# Patient Record
Sex: Male | Born: 1940 | Race: White | Hispanic: No | Marital: Married | Smoking: Former smoker
Health system: Southern US, Community
[De-identification: ages and names within clinical notes are randomized; demographics above are authoritative.]

## PROBLEM LIST (undated history)

## (undated) DIAGNOSIS — E785 Hyperlipidemia, unspecified: Secondary | ICD-10-CM

## (undated) DIAGNOSIS — I219 Acute myocardial infarction, unspecified: Secondary | ICD-10-CM

## (undated) DIAGNOSIS — M1990 Unspecified osteoarthritis, unspecified site: Secondary | ICD-10-CM

## (undated) DIAGNOSIS — I8390 Asymptomatic varicose veins of unspecified lower extremity: Secondary | ICD-10-CM

## (undated) DIAGNOSIS — C801 Malignant (primary) neoplasm, unspecified: Secondary | ICD-10-CM

## (undated) DIAGNOSIS — I2699 Other pulmonary embolism without acute cor pulmonale: Secondary | ICD-10-CM

## (undated) DIAGNOSIS — E039 Hypothyroidism, unspecified: Secondary | ICD-10-CM

## (undated) DIAGNOSIS — I739 Peripheral vascular disease, unspecified: Secondary | ICD-10-CM

## (undated) DIAGNOSIS — D759 Disease of blood and blood-forming organs, unspecified: Secondary | ICD-10-CM

## (undated) DIAGNOSIS — I2510 Atherosclerotic heart disease of native coronary artery without angina pectoris: Secondary | ICD-10-CM

## (undated) DIAGNOSIS — D751 Secondary polycythemia: Secondary | ICD-10-CM

## (undated) DIAGNOSIS — I10 Essential (primary) hypertension: Secondary | ICD-10-CM

## (undated) HISTORY — DX: Atherosclerotic heart disease of native coronary artery without angina pectoris: I25.10

## (undated) HISTORY — DX: Secondary polycythemia: D75.1

## (undated) HISTORY — DX: Asymptomatic varicose veins of unspecified lower extremity: I83.90

## (undated) HISTORY — DX: Hyperlipidemia, unspecified: E78.5

## (undated) HISTORY — DX: Essential (primary) hypertension: I10

## (undated) HISTORY — PX: VASCULAR SURGERY: SHX849

---

## 2003-03-26 ENCOUNTER — Ambulatory Visit (HOSPITAL_COMMUNITY): Admission: RE | Admit: 2003-03-26 | Discharge: 2003-03-26 | Payer: Self-pay | Admitting: Gastroenterology

## 2009-10-09 HISTORY — PX: PROSTATE SURGERY: SHX751

## 2009-12-27 ENCOUNTER — Ambulatory Visit: Payer: Self-pay | Admitting: Vascular Surgery

## 2010-03-29 ENCOUNTER — Ambulatory Visit: Payer: Self-pay | Admitting: Vascular Surgery

## 2010-05-30 ENCOUNTER — Ambulatory Visit: Payer: Self-pay | Admitting: Vascular Surgery

## 2010-06-02 ENCOUNTER — Ambulatory Visit: Payer: Self-pay | Admitting: Vascular Surgery

## 2010-06-06 ENCOUNTER — Ambulatory Visit: Payer: Self-pay | Admitting: Vascular Surgery

## 2010-06-20 ENCOUNTER — Inpatient Hospital Stay (HOSPITAL_COMMUNITY)
Admission: RE | Admit: 2010-06-20 | Discharge: 2010-06-21 | Payer: Self-pay | Source: Home / Self Care | Admitting: Urology

## 2010-06-20 ENCOUNTER — Encounter (INDEPENDENT_AMBULATORY_CARE_PROVIDER_SITE_OTHER): Payer: Self-pay | Admitting: Urology

## 2010-06-29 ENCOUNTER — Ambulatory Visit: Payer: Self-pay | Admitting: Internal Medicine

## 2010-06-29 ENCOUNTER — Encounter: Admission: RE | Admit: 2010-06-29 | Discharge: 2010-06-29 | Payer: Self-pay | Admitting: Internal Medicine

## 2010-06-29 ENCOUNTER — Inpatient Hospital Stay (HOSPITAL_COMMUNITY): Admission: EM | Admit: 2010-06-29 | Discharge: 2010-07-04 | Payer: Self-pay | Admitting: Emergency Medicine

## 2010-06-30 ENCOUNTER — Encounter (INDEPENDENT_AMBULATORY_CARE_PROVIDER_SITE_OTHER): Payer: Self-pay | Admitting: Internal Medicine

## 2010-06-30 ENCOUNTER — Ambulatory Visit: Payer: Self-pay | Admitting: Vascular Surgery

## 2010-10-30 ENCOUNTER — Encounter: Payer: Self-pay | Admitting: Internal Medicine

## 2010-12-22 LAB — BASIC METABOLIC PANEL
CO2: 25 mEq/L (ref 19–32)
CO2: 27 mEq/L (ref 19–32)
Calcium: 8.9 mg/dL (ref 8.4–10.5)
Calcium: 9 mg/dL (ref 8.4–10.5)
Chloride: 101 mEq/L (ref 96–112)
Chloride: 105 mEq/L (ref 96–112)
Creatinine, Ser: 1.12 mg/dL (ref 0.4–1.5)
Creatinine, Ser: 1.28 mg/dL (ref 0.4–1.5)
GFR calc Af Amer: >60 mL/min (ref >60)
GFR calc Af Amer: >60 mL/min (ref >60)
GFR calc non Af Amer: >60 mL/min (ref >60)
Glucose, Bld: 102 mg/dL — ABNORMAL HIGH (ref 70–99)
Potassium: 4.4 mEq/L (ref 3.5–5.1)
Potassium: 4.1 mEq/L (ref 3.5–5.1)
Sodium: 137 mEq/L (ref 135–145)
Sodium: 138 mEq/L (ref 135–145)

## 2010-12-22 LAB — HEPATIC FUNCTION PANEL
AST: 57 U/L — ABNORMAL HIGH (ref 0–37)
Albumin: 3 g/dL — ABNORMAL LOW (ref 3.5–5.2)

## 2010-12-22 LAB — URINE CULTURE
Colony Count: NO GROWTH
Culture  Setup Time: 201109221124
Culture: NO GROWTH

## 2010-12-22 LAB — CBC
HCT: 43.5 % (ref 39.0–52.0)
HCT: 43.5 % (ref 39.0–52.0)
Hemoglobin: 14.6 g/dL (ref 13.0–17.0)
Hemoglobin: 14.9 g/dL (ref 13.0–17.0)
Hemoglobin: 14.9 g/dL (ref 13.0–17.0)
Hemoglobin: 17.8 g/dL — ABNORMAL HIGH (ref 13.0–17.0)
MCH: 28.8 pg (ref 26.0–34.0)
MCH: 29 pg (ref 26.0–34.0)
MCH: 29 pg (ref 26.0–34.0)
MCH: 30.2 pg (ref 26.0–34.0)
MCHC: 33.9 g/dL (ref 30.0–36.0)
MCHC: 34.3 g/dL (ref 30.0–36.0)
MCV: 84.6 fL (ref 78.0–100.0)
MCV: 86.3 fL (ref 78.0–100.0)
MCV: 86.7 fL (ref 78.0–100.0)
MCV: 86.7 fL (ref 78.0–100.0)
MCV: 87.7 fL (ref 78.0–100.0)
Platelets: 295 10*3/uL (ref 150–400)
Platelets: 299 10*3/uL (ref 150–400)
Platelets: 299 10*3/uL (ref 150–400)
Platelets: 301 10*3/uL (ref 150–400)
Platelets: 305 10*3/uL (ref 150–400)
Platelets: 276 10*3/uL (ref 150–400)
RBC: 4.76 MIL/uL (ref 4.22–5.81)
RBC: 4.88 MIL/uL (ref 4.22–5.81)
RBC: 4.93 MIL/uL (ref 4.22–5.81)
RBC: 5.02 MIL/uL (ref 4.22–5.81)
RBC: 5.14 MIL/uL (ref 4.22–5.81)
RBC: 5.88 MIL/uL — ABNORMAL HIGH (ref 4.22–5.81)
RDW: 14.2 % (ref 11.5–15.5)
RDW: 14.3 % (ref 11.5–15.5)
RDW: 14.3 % (ref 11.5–15.5)
WBC: 20.2 10*3/uL — ABNORMAL HIGH (ref 4.0–10.5)
WBC: 20.3 10*3/uL — ABNORMAL HIGH (ref 4.0–10.5)
WBC: 21.5 10*3/uL — ABNORMAL HIGH (ref 4.0–10.5)
WBC: 22 10*3/uL — ABNORMAL HIGH (ref 4.0–10.5)
WBC: 14.4 10*3/uL — ABNORMAL HIGH (ref 4.0–10.5)

## 2010-12-22 LAB — CARDIAC PANEL(CRET KIN+CKTOT+MB+TROPI)
CK, MB: 2.1 ng/mL (ref 0.3–4.0)
Relative Index: INVALID (ref 0.0–2.5)
Relative Index: INVALID (ref 0.0–2.5)
Total CK: 33 U/L (ref 7–232)
Troponin I: 0.05 ng/mL (ref 0.00–0.06)
Troponin I: 0.06 ng/mL (ref 0.00–0.06)

## 2010-12-22 LAB — TROPONIN I: Troponin I: 0.03 ng/mL (ref 0.00–0.06)

## 2010-12-22 LAB — HEMOCCULT GUIAC POC 1CARD (OFFICE): Fecal Occult Bld: NEGATIVE

## 2010-12-22 LAB — HEMOGLOBIN: Hemoglobin: 14.5 g/dL (ref 13.0–17.0)

## 2010-12-22 LAB — DIFFERENTIAL
Basophils Absolute: 0.1 10*3/uL (ref 0.0–0.1)
Eosinophils Absolute: 0.5 10*3/uL (ref 0.0–0.7)
Lymphocytes Relative: 10 % — ABNORMAL LOW (ref 12–46)
Lymphocytes Relative: 11 % — ABNORMAL LOW (ref 12–46)
Lymphs Abs: 1.9 10*3/uL (ref 0.7–4.0)
Lymphs Abs: 2.3 10*3/uL (ref 0.7–4.0)
Monocytes Absolute: 4.1 10*3/uL — ABNORMAL HIGH (ref 0.1–1.0)
Neutro Abs: 13.5 10*3/uL — ABNORMAL HIGH (ref 1.7–7.7)
Neutrophils Relative %: 65 % (ref 43–77)

## 2010-12-22 LAB — TYPE AND SCREEN: Antibody Screen: NEGATIVE

## 2010-12-22 LAB — URINALYSIS, ROUTINE W REFLEX MICROSCOPIC
Bilirubin Urine: NEGATIVE
Protein, ur: NEGATIVE mg/dL
Urobilinogen, UA: 1 mg/dL (ref 0.0–1.0)

## 2010-12-22 LAB — CK TOTAL AND CKMB (NOT AT ARMC)
CK, MB: 1.5 ng/mL (ref 0.3–4.0)
Total CK: 38 U/L (ref 7–232)

## 2010-12-22 LAB — HEMOGLOBIN AND HEMATOCRIT, BLOOD: Hemoglobin: 15.8 g/dL (ref 13.0–17.0)

## 2010-12-22 LAB — HEPARIN LEVEL (UNFRACTIONATED): Heparin Unfractionated: 0.24 IU/mL — ABNORMAL LOW (ref 0.30–0.70)

## 2010-12-22 LAB — BRAIN NATRIURETIC PEPTIDE: Pro B Natriuretic peptide (BNP): 125 pg/mL — ABNORMAL HIGH (ref 0.0–100.0)

## 2010-12-22 LAB — PROTIME-INR
INR: 1.34 (ref 0.00–1.49)
Prothrombin Time: 15.3 seconds — ABNORMAL HIGH (ref 11.6–15.2)
Prothrombin Time: 15.4 seconds — ABNORMAL HIGH (ref 11.6–15.2)
Prothrombin Time: 15.6 seconds — ABNORMAL HIGH (ref 11.6–15.2)
Prothrombin Time: 16.1 seconds — ABNORMAL HIGH (ref 11.6–15.2)

## 2010-12-22 LAB — POCT CARDIAC MARKERS: Myoglobin, poc: 75.2 ng/mL (ref 12–200)

## 2010-12-22 LAB — HEMATOCRIT: HCT: 42.5 % (ref 39.0–52.0)

## 2010-12-22 LAB — URINE MICROSCOPIC-ADD ON

## 2010-12-22 LAB — SURGICAL PCR SCREEN: MRSA, PCR: NEGATIVE

## 2010-12-22 LAB — APTT: aPTT: 38 seconds — ABNORMAL HIGH (ref 24–37)

## 2010-12-22 LAB — MAGNESIUM: Magnesium: 2.1 mg/dL (ref 1.5–2.5)

## 2010-12-22 LAB — LIPID PANEL: VLDL: 17 mg/dL (ref 0–40)

## 2010-12-22 LAB — PHOSPHORUS: Phosphorus: 2.6 mg/dL (ref 2.3–4.6)

## 2011-02-21 NOTE — Assessment & Plan Note (Signed)
OFFICE VISIT   DVONTAE, RUAN  DOB:  29-Sep-1941                                       03/29/2010  ZOXWR#:[id number]   The patient returns today for continued followup regarding severe venous  insufficiency of both lower extremities.  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  He has been wearing long-leg  elastic compression stockings (20 mm-30 mm of gradient) and has had no  improvement.  He has documented reflux throughout the left great  saphenous vein from the saphenofemoral junction to the knee.  The right  great saphenous vein is absent but it does have incompetent perforators  causing high pressure supplying these recurrent varicosities.  Despite  conservative measures, he continues to have significant symptoms in both  legs.  He also has reflux in the left small saphenous vein supplying  bulging varicosities in the left posterior calf.   On examination, he has 3+ dorsalis pedis pulses bilaterally.  Has  bulging varicosities in the distal thigh and calf areas in both legs  with 1+ edema.   I think because he has failed conservative treatment he needs the  following procedures:  1. Laser ablation of the left great saphenous vein.  2. Laser ablation of the left small saphenous vein.  3. Stab phlebectomies of the right leg.  4. Stab phlebectomies of the left leg following his 2 laser      procedures.  We will proceed with precertification for this in the      near future.     Quita Skye Hart [place], M.D.  Electronically Signed   JDL/MEDQ  D:  03/29/2010  T:  03/29/2010  Job:  [id number]

## 2011-02-21 NOTE — Assessment & Plan Note (Signed)
OFFICE VISIT   [name], Gregory Blankenship  DOB:  1941-05-30                                       06/06/2010  [id number]#:[id number]   The patient had multiple stab phlebectomies (greater than 20) of the  right leg for painful varicosities.  There is no long segment of  saphenous vein to require closure with the laser at this time.  He  tolerated the procedure well and following robotic prostate surgery in  the near future, he will return for his laser ablation of his left small  saphenous vein.  I did visualize the left saphenofemoral junction today  and there is a slight bulge into the common femoral vein at the  saphenofemoral junction from the recent closure 1 week ago of the left  great saphenous vein.  He has no distal edema and the left common  femoral vein has excellent flow and is essentially widely patent except  for this slight protrusion.  He will continue taking daily aspirin and  return to see as soon following his prostate surgery.     Quita Skye Hart [place], M.D.  Electronically Signed   JDL/MEDQ  D:  06/06/2010  T:  06/07/2010  Job:  [id number]

## 2011-02-21 NOTE — Procedures (Signed)
DUPLEX DEEP VENOUS EXAM - LOWER EXTREMITY   INDICATION:  Followup left greater saphenous vein laser ablation.   HISTORY:  Edema:  No.  Trauma/Surgery:  Left greater saphenous vein laser ablation on  05/30/2010.  Pain:  No.  PE:  No.  Previous DVT:  No.  Anticoagulants:  Other:   DUPLEX EXAM:                CFV   SFV   PopV  PTV    GSV                R  L  R  L  R  L  R   L  R  L  Thrombosis    o  +     o     o      o     +  Spontaneous   +  +     +     +      +     o  Phasic        +  +     +     +      +     o  Augmentation  +  +     +     +      +     o  Compressible  +  P     +     +      +     o  Competent     o  o     o     o      +     o   Legend:  + - yes  o - no  p - partial  D - decreased   IMPRESSION:  1. Totally occluded left greater saphenous vein extending from the      distal insertion site to the saphenofemoral junction with thrombus      protruding 0.4 cm into the common femoral vein lumen.  2. Clinically significant reflux noted in the bilateral common femoral      veins, left superficial femoral vein, popliteal vein, short      saphenous vein and in the left saphenofemoral junction, as      described above.  3. Two incompetent perforators noted in the left distal medial calf      area measuring > 0.4 cm.    _____________________________  Quita Skye. Hart [place], M.D.   CH/MEDQ  D:  06/03/2010  T:  06/03/2010  Job:  [id number]

## 2011-02-21 NOTE — Procedures (Signed)
LOWER EXTREMITY VENOUS REFLUX EXAM   INDICATION:  Bilateral lower extremity swelling, pain, and varicosities.   EXAM:  Using color-flow imaging and pulse Doppler spectral analysis, the  bilateral common femoral, superficial femoral, popliteal, posterior  tibial, greater and lesser saphenous veins are evaluated.  There is  evidence suggesting mild deep venous insufficiency in bilateral lower  extremities.   The bilateral saphenofemoral junctions are competent. The left GSV is  not competent with Reflux of >556milliseconds with the caliber as  described below.   The left proximal short saphenous vein demonstrates incompetency.   GSV Diameter (used if found to be incompetent only)                                            Right    Left  Proximal Greater Saphenous Vein           cm       0.88 cm  Proximal-to-mid-thigh                     cm       0.92 cm  Mid thigh                                 cm       0.86 cm  Mid-distal thigh                          cm       cm  Distal thigh                              cm       1.01 cm  Knee                                      cm       0.99 cm   IMPRESSION:  1. Left greater saphenous vein Reflux with >514milliseconds is      identified with the caliber ranging from 0.99 cm to 0.88 cm knee to      groin.  2. The left greater saphenous vein is not aneurysmal.  3. The left greater saphenous vein is not tortuous.  4. The deep venous system is competent.  5. The left lesser saphenous vein is not competent with Reflux of      >571milliseconds and measures 0.47 cm to proximal calf and 0.52 in      the mid calf.  6. The right great saphenous vein in the mid thigh and anterior      varicose vein branch are  thrombosed and recannulized in the distal      thigh varicosity to create a false great saphenous vein from distal      thigh to the proximal calf.  7. Incompetent perforator veins identified bilaterally and      contributing  to varicosities, right medial calf proximal at 0.44      cm, mid 0.42 cm, and distal 0.44 cm.  Left medial calf proximal      0.32 cm, mid 0.3 and 0.39 cm.  Distal 0.54 cm.  Left posterior calf  measuring 0.31 cm.   ___________________________________________  Quita Skye. Hart [place], M.D.   CJ/MEDQ  D:  12/27/2009  T:  12/27/2009  Job:  [id number]

## 2011-02-21 NOTE — Consult Note (Signed)
NEW PATIENT CONSULTATION   Gregory Blankenship, Gregory Blankenship  DOB:  Apr 26, 1941                                       12/27/2009  OZHYQ#:[id number]   Patient is a 70 year old male patient with severe venous insufficiency  of the left leg, worse than the right.  He previously has had venous  procedures in both lower extremities.  He had ligation stripping of his  greater saphenous vein in the right leg in the early 1960s.  His left  leg had a surgical procedure performed by me in the early 1990s with a  stripping of his distal left great saphenous vein from the ankle to the  knee.  Several years later, he developed recurrent varicosities which  had become increasingly painful with aching, cramping, and a tired heavy  feeling as the day progresses, associated with swelling in the ankle and  foot.  He has had no history of bleeding ulceration, thrombophlebitis,  deep venous thrombosis but has had increasing bulging as time goes on.  The right leg is less symptomatic.  He has worn some short leg  compression stockings recently but no long-leg stockings.  He does have  some improvement with elevation of the legs but does not take pain  medicine on a regular basis.   Negative for diabetes, hypertension, coronary artery disease, COPD, or  stroke.   FAMILY HISTORY:  Positive for varicose veins in his mother and  grandfather.  Negative for coronary artery disease, diabetes, and  stroke.   SOCIAL HISTORY:  He is married, has 2 children, is retired.  Does not  smoke since 1969.  Does not use alcohol.   REVIEW OF SYSTEMS:  Denies any chest pain, dyspnea on exertion, PND,  orthopnea.  No claudication.  No GI, GU or neurologic symptoms.  No  musculoskeletal symptoms.  All other systems and review of systems are  negative.   PHYSICAL EXAMINATION:  Blood pressure 134/98, heart rate 60,  respirations 16.  Generally, he is a well-developed, well-nourished male  who is in no apparent distress,  alert and oriented x3.  Neck is supple,  3+ carotid pulses palpable.  No bruits are audible.  Left leg has  bulging varicosities beginning in the distal thigh medially over the  great saphenous system, extending down on the medial knee into the  medial calf with a few posteriorly extending down from the popliteal  fossa.  There is 1+ edema distally with no hyperpigmentation or  ulceration noted.  The right leg has a few isolated varicosities in the  medial calf and anterior medial thigh.   Today I ordered a venous duplex exam which I reviewed and interpreted.  He has some mild reflux in the deep systems bilaterally.  In the left  leg, he has gross reflux from the saphenofemoral junction to the knee in  the great saphenous vein, which has not been removed, and an absent  great saphenous vein distally with a few incompetent perforators in the  calf.  The left small saphenous also has reflux but is a smaller size.  The right leg has an absent great saphenous vein with a few incompetent  perforators in the distal thigh and mid calf.   This patient is having painful varicosities secondary to left great  saphenous reflux, which are affecting his daily living.  We will treat  him  conservatively with long-leg elastic compression stockings (20 mm -  30 mm gradient) as well as elevation and ibuprofen on a daily basis.  If  he has had no improvement in 3 months, I think he should be offered  laser ablation of the left great saphenous vein with multiple stab  phlebectomies as essential procedure.  He will return in 3 months for  further followup.     Quita Skye Hart [place], M.D.  Electronically Signed   JDL/MEDQ  D:  12/27/2009  T:  12/28/2009  Job:  [id number]   cc:   Deirdre Peer. Polite, M.D.

## 2011-02-24 NOTE — Op Note (Signed)
   NAME:  Gregory Blankenship, Gregory Blankenship                         ACCOUNT NO.:  [id number]   MEDICAL RECORD NO.:  [id number]                  PATIENT TYPE:   LOCATION:                                       FACILITY:  [facility]   PHYSICIAN:  Danise Edge, M.D.                DATE OF BIRTH:  [birth year]   DATE OF PROCEDURE:  03/26/2003  DATE OF DISCHARGE:  03/26/2003                                 OPERATIVE REPORT   PROCEDURE:  Colonoscopy.   INDICATIONS:  This patient is a 70 year old male born 1941-10-08.  The is  patient is undergoing a diagnostic colonoscopy to evaluate guaiac-positive  brown stool by digital rectal examination performed by Dr. Ike Bene.   ENDOSCOPIST:  Danise Edge, M.D.   PREMEDICATIONS:  Versed 7.5 mg, Demerol 50 mg.   PROCEDURE:  After obtaining informed consent this patient was placed in the  left lateral decubitus position.  I administered intravenous Demerol and  intravenous Versed to achieve conscious sedation for the procedure.  The  patient's blood pressure, oxygen saturation, and cardiac rhythm were  monitored throughout the procedure and documented in the medical record.   Anal inspection was normal.  Digital rectal exam revealed a nonnodular  prostate.  The Olympus adult colonoscope was introduced into the rectum and  advanced up the cecum.  A normal appearing ileocecal valve was intubated and  the distal ileum inspection.  Colonic preparation for the exam today was  excellent.   RECTUM:  Normal.   SIGMOID COLON AND DESCENDING COLON:  Normal.   SPLENIC FLEXURE:  Normal.   TRANSVERSE COLON:  Normal.   HEPATIC FLEXURE:  Normal.   ASCENDING COLON:  Normal.   CECUM AND ILEOCECAL VALVE:  Normal.   DISTAL ILEUM:  Normal.    ASSESSMENT:  Normal proctocolonoscopy to the cecum.  No endoscopic evidence  for the presence of colorectal neoplasia or lower gastrointestinal bleeding.                                               Danise Edge,  M.D.    MJ/MEDQ  D:  03/26/2003  T:  03/27/2003  Job:  [id number]   cc:   Ike Bene, M.D.  [address]  [place]  [place] [address]  Fax: [phone]

## 2011-07-03 IMAGING — CR DG CHEST 1V PORT
1 series · 1 of 1 positions shown · non-contrast
Comparison: 06/10/2010 and chest CT dated 06/29/2010

CLINICAL DATA: Follow-up pulmonary embolism

PORTABLE CHEST - 1 VIEW

[AP]
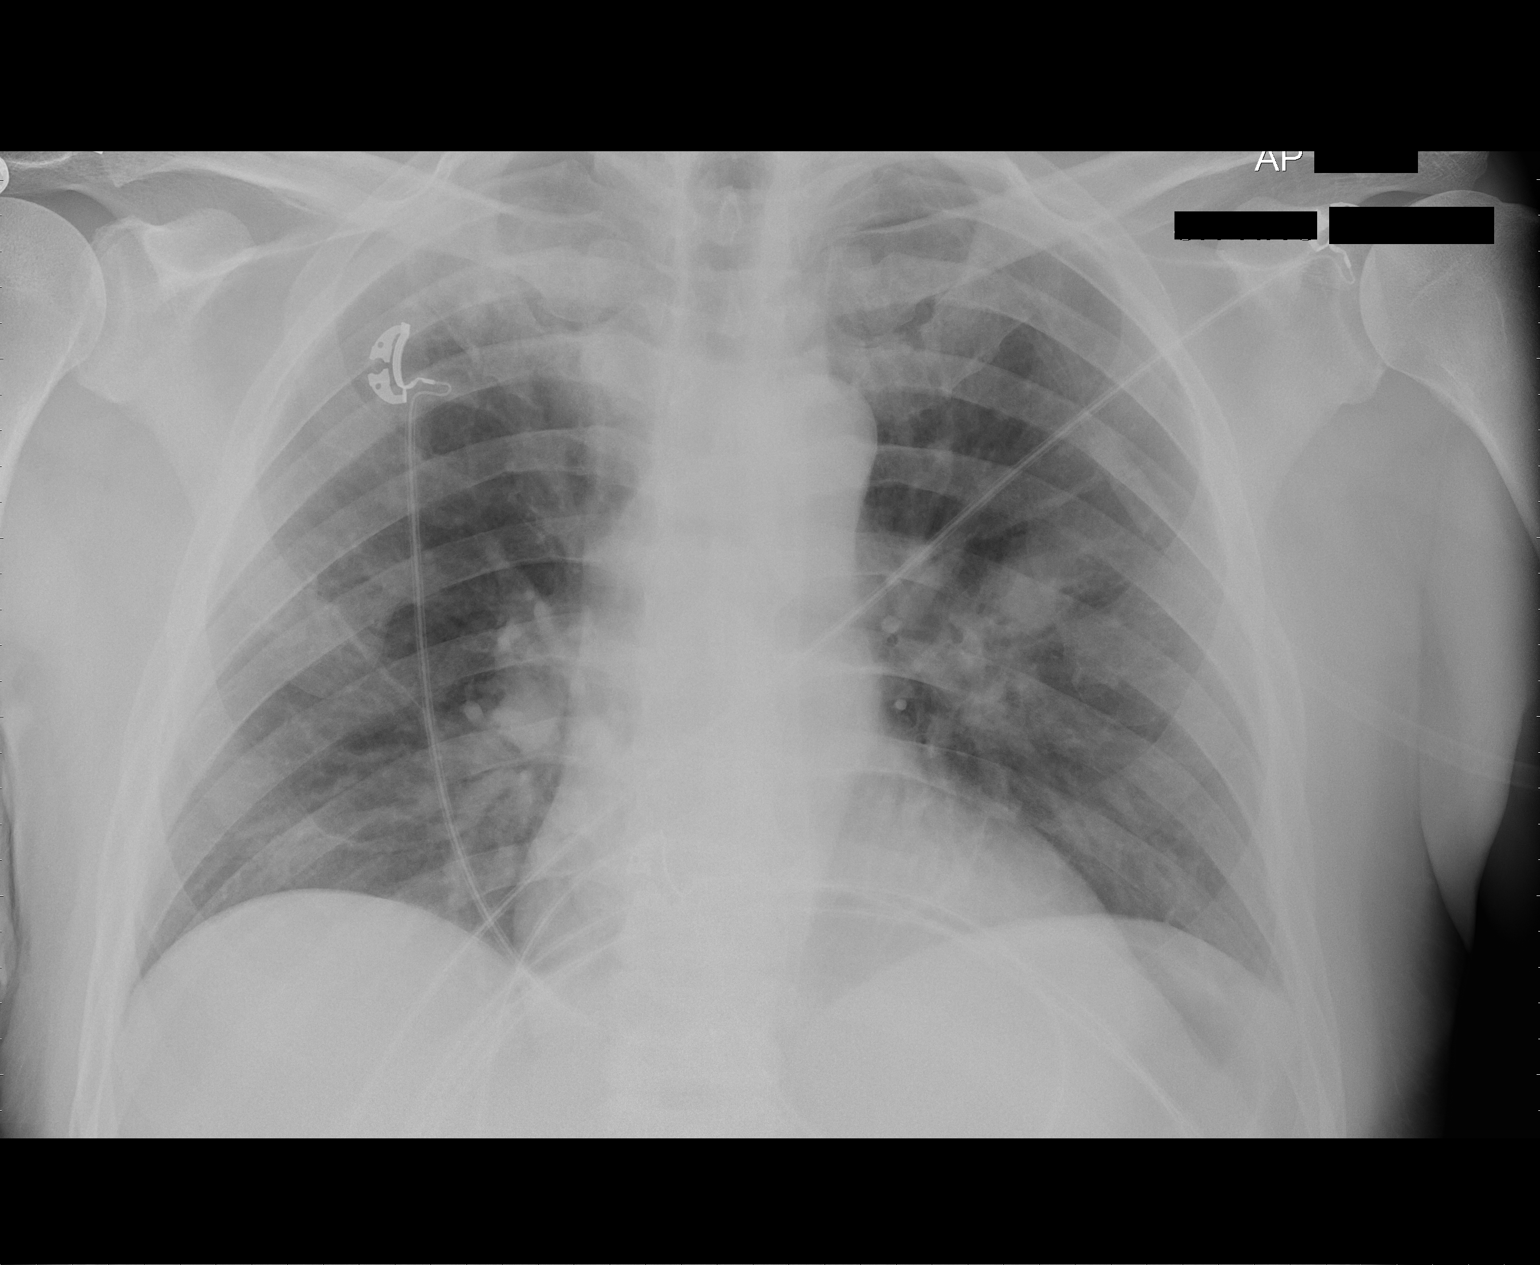

[1 of 1 positions shown; findings below may reference images not displayed]

FINDINGS: There is increasing perihilar opacities with persistent
increased opacity in the right upper lobe.  There may be small
pleural effusions.  The heart is unchanged in size and contour.
The upper abdomen osseous structures are unchanged.
IMPRESSION: Interval increase in perihilar airspace opacities with unchanged
opacity the right upper lobe.  This may relate to evolution of
pulmonary infarction/reperfusion edema especially in the right
upper lobe.  Superimposed infection or developing edema cannot be
excluded.

## 2011-11-27 ENCOUNTER — Telehealth: Payer: Self-pay | Admitting: Oncology

## 2011-11-27 ENCOUNTER — Telehealth: Payer: Self-pay | Admitting: *Deleted

## 2011-11-27 NOTE — Telephone Encounter (Signed)
Rec'd referral from Dr Nehemiah Settle for elevated WBC's and elevated RBC's, r/o PCV

## 2011-11-27 NOTE — Telephone Encounter (Signed)
called pt and scheduled new pt appt for 02/26.  will fax over a letter to Dr. Idelle Crouch office with appt d/t

## 2011-11-28 ENCOUNTER — Telehealth: Payer: Self-pay | Admitting: Oncology

## 2011-11-28 NOTE — Telephone Encounter (Signed)
Referred by Dr. Renford Dills Dx- Elevated WBC/RBC

## 2011-12-01 ENCOUNTER — Other Ambulatory Visit: Payer: Self-pay | Admitting: Oncology

## 2011-12-01 DIAGNOSIS — D751 Secondary polycythemia: Secondary | ICD-10-CM

## 2011-12-02 ENCOUNTER — Encounter (HOSPITAL_COMMUNITY)
Admission: EM | Disposition: A | Discharge status: Home / Self Care | Payer: Self-pay | Source: Home / Self Care | Attending: Cardiovascular Disease

## 2011-12-02 ENCOUNTER — Other Ambulatory Visit: Payer: Self-pay

## 2011-12-02 ENCOUNTER — Inpatient Hospital Stay (HOSPITAL_BASED_OUTPATIENT_CLINIC_OR_DEPARTMENT_OTHER)
Admission: EM | Admit: 2011-12-02 | Discharge: 2011-12-06 | DRG: 249 | Disposition: A | Discharge status: Home / Self Care | Payer: Medicare Other | Attending: Cardiovascular Disease | Admitting: Cardiovascular Disease

## 2011-12-02 ENCOUNTER — Encounter (HOSPITAL_BASED_OUTPATIENT_CLINIC_OR_DEPARTMENT_OTHER): Payer: Self-pay | Admitting: Emergency Medicine

## 2011-12-02 DIAGNOSIS — D72829 Elevated white blood cell count, unspecified: Secondary | ICD-10-CM | POA: Diagnosis present

## 2011-12-02 DIAGNOSIS — I2510 Atherosclerotic heart disease of native coronary artery without angina pectoris: Secondary | ICD-10-CM | POA: Diagnosis present

## 2011-12-02 DIAGNOSIS — I213 ST elevation (STEMI) myocardial infarction of unspecified site: Secondary | ICD-10-CM | POA: Diagnosis present

## 2011-12-02 DIAGNOSIS — I2582 Chronic total occlusion of coronary artery: Secondary | ICD-10-CM | POA: Diagnosis present

## 2011-12-02 DIAGNOSIS — Z7901 Long term (current) use of anticoagulants: Secondary | ICD-10-CM

## 2011-12-02 DIAGNOSIS — Z8546 Personal history of malignant neoplasm of prostate: Secondary | ICD-10-CM

## 2011-12-02 DIAGNOSIS — Z87891 Personal history of nicotine dependence: Secondary | ICD-10-CM

## 2011-12-02 DIAGNOSIS — I2782 Chronic pulmonary embolism: Secondary | ICD-10-CM | POA: Diagnosis present

## 2011-12-02 DIAGNOSIS — I2109 ST elevation (STEMI) myocardial infarction involving other coronary artery of anterior wall: Principal | ICD-10-CM | POA: Diagnosis present

## 2011-12-02 DIAGNOSIS — I2699 Other pulmonary embolism without acute cor pulmonale: Secondary | ICD-10-CM | POA: Diagnosis present

## 2011-12-02 DIAGNOSIS — E039 Hypothyroidism, unspecified: Secondary | ICD-10-CM | POA: Diagnosis present

## 2011-12-02 DIAGNOSIS — I255 Ischemic cardiomyopathy: Secondary | ICD-10-CM | POA: Diagnosis present

## 2011-12-02 DIAGNOSIS — I2589 Other forms of chronic ischemic heart disease: Secondary | ICD-10-CM | POA: Diagnosis present

## 2011-12-02 HISTORY — PX: PERCUTANEOUS CORONARY INTERVENTION-BALLOON ONLY: SHX6014

## 2011-12-02 HISTORY — PX: LEFT HEART CATHETERIZATION WITH CORONARY ANGIOGRAM: SHX5451

## 2011-12-02 HISTORY — DX: Hypothyroidism, unspecified: E03.9

## 2011-12-02 HISTORY — DX: Other pulmonary embolism without acute cor pulmonale: I26.99

## 2011-12-02 LAB — CBC
HCT: 57.6 % — ABNORMAL HIGH (ref 39.0–52.0)
HCT: 54.6 % — ABNORMAL HIGH (ref 39.0–52.0)
Hemoglobin: 20.2 g/dL — ABNORMAL HIGH (ref 13.0–17.0)
Hemoglobin: 19.1 g/dL — ABNORMAL HIGH (ref 13.0–17.0)
MCH: 26.6 pg (ref 26.0–34.0)
MCH: 27.7 pg (ref 26.0–34.0)
MCHC: 35 g/dL (ref 30.0–36.0)
MCV: 79.2 fL (ref 78.0–100.0)
Platelets: 383 10*3/uL (ref 150–400)
RBC: 7.58 MIL/uL — ABNORMAL HIGH (ref 4.22–5.81)
RBC: 6.89 MIL/uL — ABNORMAL HIGH (ref 4.22–5.81)
RDW: 16.2 % — ABNORMAL HIGH (ref 11.5–15.5)
WBC: 24.4 10*3/uL — ABNORMAL HIGH (ref 4.0–10.5)

## 2011-12-02 LAB — CARDIAC PANEL(CRET KIN+CKTOT+MB+TROPI)
CK, MB: 64.7 ng/mL (ref 0.3–4.0)
CK, MB: 61.5 ng/mL (ref 0.3–4.0)
Relative Index: 13.1 — ABNORMAL HIGH (ref 0.0–2.5)
Relative Index: 11.5 — ABNORMAL HIGH (ref 0.0–2.5)
Total CK: 494 U/L — ABNORMAL HIGH (ref 7–232)
Total CK: 537 U/L — ABNORMAL HIGH (ref 7–232)
Troponin I: 2.09 ng/mL (ref <0.30)
Troponin I: 5.07 ng/mL (ref <0.30)

## 2011-12-02 LAB — BASIC METABOLIC PANEL
BUN: 18 mg/dL (ref 6–23)
CO2: 26 mEq/L (ref 19–32)
Chloride: 101 mEq/L (ref 96–112)
Creatinine, Ser: 1.1 mg/dL (ref 0.50–1.35)
Glucose, Bld: 107 mg/dL — ABNORMAL HIGH (ref 70–99)

## 2011-12-02 LAB — COMPREHENSIVE METABOLIC PANEL
ALT: 28 U/L (ref 0–53)
AST: 67 U/L — ABNORMAL HIGH (ref 0–37)
Albumin: 3.4 g/dL — ABNORMAL LOW (ref 3.5–5.2)
Alkaline Phosphatase: 92 U/L (ref 39–117)
BUN: 17 mg/dL (ref 6–23)
CO2: 25 mEq/L (ref 19–32)
Calcium: 9.2 mg/dL (ref 8.4–10.5)
Chloride: 101 mEq/L (ref 96–112)
Creatinine, Ser: 1.1 mg/dL (ref 0.50–1.35)
GFR calc Af Amer: 78 mL/min — ABNORMAL LOW (ref >90)
GFR calc non Af Amer: 67 mL/min — ABNORMAL LOW (ref >90)
Glucose, Bld: 99 mg/dL (ref 70–99)
Potassium: 4.7 mEq/L (ref 3.5–5.1)
Sodium: 134 mEq/L — ABNORMAL LOW (ref 135–145)
Total Bilirubin: 0.7 mg/dL (ref 0.3–1.2)
Total Protein: 6.8 g/dL (ref 6.0–8.3)

## 2011-12-02 LAB — PROTIME-INR
INR: 5.09 (ref 0.00–1.49)
INR: 3.03 — ABNORMAL HIGH (ref 0.00–1.49)
Prothrombin Time: 26.6 seconds — ABNORMAL HIGH (ref 11.6–15.2)
Prothrombin Time: 47.8 seconds — ABNORMAL HIGH (ref 11.6–15.2)
Prothrombin Time: 31.9 seconds — ABNORMAL HIGH (ref 11.6–15.2)

## 2011-12-02 LAB — DIFFERENTIAL
Basophils Absolute: 0.1 10*3/uL (ref 0.0–0.1)
Basophils Relative: 0 % (ref 0–1)
Eosinophils Absolute: 0.1 10*3/uL (ref 0.0–0.7)
Eosinophils Relative: 1 % (ref 0–5)
Lymphocytes Relative: 6 % — ABNORMAL LOW (ref 12–46)
Lymphs Abs: 1.5 10*3/uL (ref 0.7–4.0)
Monocytes Absolute: 1.7 10*3/uL — ABNORMAL HIGH (ref 0.1–1.0)
Monocytes Relative: 7 % (ref 3–12)
Neutro Abs: 21 10*3/uL — ABNORMAL HIGH (ref 1.7–7.7)
Neutrophils Relative %: 86 % — ABNORMAL HIGH (ref 43–77)

## 2011-12-02 LAB — MAGNESIUM: Magnesium: 1.8 mg/dL (ref 1.5–2.5)

## 2011-12-02 LAB — PRO B NATRIURETIC PEPTIDE: Pro B Natriuretic peptide (BNP): 3684 pg/mL — ABNORMAL HIGH (ref 0–125)

## 2011-12-02 LAB — APTT: aPTT: 143 seconds — ABNORMAL HIGH (ref 24–37)

## 2011-12-02 SURGERY — LEFT HEART CATHETERIZATION WITH CORONARY ANGIOGRAM
Anesthesia: LOCAL

## 2011-12-02 MED ORDER — WARFARIN VIDEO: Freq: Once | Status: DC

## 2011-12-02 MED ORDER — ASPIRIN 81 MG PO CHEW: 324.0000 mg | CHEWABLE_TABLET | ORAL | Status: DC

## 2011-12-02 MED ORDER — ONDANSETRON HCL 4 MG/2ML IJ SOLN
4.0000 mg | Freq: Once | INTRAMUSCULAR | Status: AC
  Administered 2011-12-02: 4 mg via INTRAVENOUS

## 2011-12-02 MED ORDER — FAMOTIDINE IN NACL 20-0.9 MG/50ML-% IV SOLN
INTRAVENOUS | Status: AC
  Filled 2011-12-02: qty 50

## 2011-12-02 MED ORDER — LEVOTHYROXINE SODIUM 112 MCG PO TABS
112.0000 ug | ORAL_TABLET | Freq: Every day | ORAL | Status: DC
  Administered 2011-12-03 – 2011-12-06 (×4): 112 ug via ORAL
  Filled 2011-12-02 (×5): qty 1

## 2011-12-02 MED ORDER — VERAPAMIL HCL 2.5 MG/ML IV SOLN
INTRAVENOUS | Status: AC
  Filled 2011-12-02: qty 2

## 2011-12-02 MED ORDER — ASPIRIN 325 MG PO TABS: 325.0000 mg | ORAL_TABLET | ORAL | Status: DC

## 2011-12-02 MED ORDER — CARVEDILOL 3.125 MG PO TABS
3.1250 mg | ORAL_TABLET | Freq: Two times a day (BID) | ORAL | Status: DC
  Administered 2011-12-02 – 2011-12-06 (×8): 3.125 mg via ORAL
  Filled 2011-12-02 (×10): qty 1

## 2011-12-02 MED ORDER — WARFARIN SODIUM 2 MG PO TABS
2.0000 mg | ORAL_TABLET | Freq: Once | ORAL | Status: AC
  Administered 2011-12-02: 2 mg via ORAL
  Filled 2011-12-02: qty 1

## 2011-12-02 MED ORDER — SODIUM CHLORIDE 0.9 % IV SOLN
INTRAVENOUS | Status: AC
  Administered 2011-12-02: 75 mL/h via INTRAVENOUS

## 2011-12-02 MED ORDER — MIDAZOLAM HCL 2 MG/2ML IJ SOLN
INTRAMUSCULAR | Status: AC
  Filled 2011-12-02: qty 2

## 2011-12-02 MED ORDER — WARFARIN SODIUM 5 MG PO TABS
5.0000 mg | ORAL_TABLET | Freq: Once | ORAL | Status: DC
  Filled 2011-12-02: qty 1

## 2011-12-02 MED ORDER — ONDANSETRON HCL 4 MG/2ML IJ SOLN: 4.0000 mg | Freq: Once | INTRAMUSCULAR | Status: AC

## 2011-12-02 MED ORDER — ASPIRIN 81 MG PO CHEW
CHEWABLE_TABLET | ORAL | Status: AC
  Administered 2011-12-02: 324 mg via ORAL
  Filled 2011-12-02: qty 4

## 2011-12-02 MED ORDER — ACETAMINOPHEN 325 MG PO TABS: 650.0000 mg | ORAL_TABLET | ORAL | Status: DC | PRN

## 2011-12-02 MED ORDER — ONDANSETRON HCL 4 MG/2ML IJ SOLN: 4.0000 mg | Freq: Four times a day (QID) | INTRAMUSCULAR | Status: DC | PRN

## 2011-12-02 MED ORDER — NITROGLYCERIN 0.4 MG SL SUBL
0.4000 mg | SUBLINGUAL_TABLET | SUBLINGUAL | Status: DC | PRN
  Filled 2011-12-02: qty 25

## 2011-12-02 MED ORDER — ASPIRIN 81 MG PO CHEW
324.0000 mg | CHEWABLE_TABLET | Freq: Once | ORAL | Status: AC
  Administered 2011-12-02: 324 mg via ORAL

## 2011-12-02 MED ORDER — HEPARIN SODIUM (PORCINE) 5000 UNIT/ML IJ SOLN: 60.0000 [IU]/kg | INTRAMUSCULAR | Status: DC

## 2011-12-02 MED ORDER — ALPRAZOLAM 0.25 MG PO TABS: 0.2500 mg | ORAL_TABLET | Freq: Three times a day (TID) | ORAL | Status: DC | PRN

## 2011-12-02 MED ORDER — METOPROLOL TARTRATE 1 MG/ML IV SOLN
INTRAVENOUS | Status: AC
  Administered 2011-12-02: 2.5 mg via INTRAVENOUS
  Filled 2011-12-02: qty 5

## 2011-12-02 MED ORDER — FENTANYL CITRATE 0.05 MG/ML IJ SOLN
INTRAMUSCULAR | Status: AC
  Filled 2011-12-02: qty 2

## 2011-12-02 MED ORDER — LIDOCAINE HCL (PF) 1 % IJ SOLN
INTRAMUSCULAR | Status: AC
  Filled 2011-12-02: qty 30

## 2011-12-02 MED ORDER — MORPHINE SULFATE 2 MG/ML IJ SOLN: 1.0000 mg | INTRAMUSCULAR | Status: DC | PRN

## 2011-12-02 MED ORDER — ONDANSETRON HCL 4 MG/2ML IJ SOLN
INTRAMUSCULAR | Status: AC
  Administered 2011-12-02: 4 mg via INTRAVENOUS
  Filled 2011-12-02: qty 2

## 2011-12-02 MED ORDER — NITROGLYCERIN 0.2 MG/ML ON CALL CATH LAB
INTRAVENOUS | Status: AC
  Filled 2011-12-02: qty 1

## 2011-12-02 MED ORDER — METOPROLOL TARTRATE 25 MG PO TABS: 25.0000 mg | ORAL_TABLET | Freq: Two times a day (BID) | ORAL | Status: DC

## 2011-12-02 MED ORDER — HEPARIN (PORCINE) IN NACL 2-0.9 UNIT/ML-% IJ SOLN
INTRAMUSCULAR | Status: AC
  Filled 2011-12-02: qty 2000

## 2011-12-02 MED ORDER — CLOPIDOGREL BISULFATE 300 MG PO TABS
ORAL_TABLET | ORAL | Status: AC
  Filled 2011-12-02: qty 2

## 2011-12-02 MED ORDER — ASPIRIN 325 MG PO TABS: 325.0000 mg | ORAL_TABLET | Freq: Every day | ORAL | Status: DC

## 2011-12-02 MED ORDER — ATORVASTATIN CALCIUM 10 MG PO TABS
20.0000 mg | ORAL_TABLET | Freq: Every day | ORAL | Status: DC
  Administered 2011-12-02 – 2011-12-05 (×4): 20 mg via ORAL
  Filled 2011-12-02 (×5): qty 2

## 2011-12-02 MED ORDER — LISINOPRIL 2.5 MG PO TABS
2.5000 mg | ORAL_TABLET | Freq: Every day | ORAL | Status: DC
  Administered 2011-12-02 – 2011-12-06 (×4): 2.5 mg via ORAL
  Filled 2011-12-02 (×5): qty 1

## 2011-12-02 MED ORDER — BIVALIRUDIN 250 MG IV SOLR
INTRAVENOUS | Status: AC
  Filled 2011-12-02: qty 250

## 2011-12-02 MED ORDER — NITROGLYCERIN 0.4 MG SL SUBL
0.4000 mg | SUBLINGUAL_TABLET | SUBLINGUAL | Status: DC | PRN
  Administered 2011-12-02: 0.4 mg via SUBLINGUAL
  Filled 2011-12-02: qty 25

## 2011-12-02 MED ORDER — NITROGLYCERIN IN D5W 200-5 MCG/ML-% IV SOLN
INTRAVENOUS | Status: AC
  Filled 2011-12-02: qty 250

## 2011-12-02 MED ORDER — SODIUM CHLORIDE 0.9 % IV SOLN
INTRAVENOUS | Status: DC
  Administered 2011-12-02: 20 mL via INTRAVENOUS

## 2011-12-02 MED ORDER — COUMADIN BOOK
Freq: Once | Status: AC
  Administered 2011-12-02: 15:00:00
  Filled 2011-12-02: qty 1

## 2011-12-02 MED ORDER — ZOLPIDEM TARTRATE 5 MG PO TABS
5.0000 mg | ORAL_TABLET | Freq: Every evening | ORAL | Status: DC | PRN
  Administered 2011-12-02 – 2011-12-03 (×2): 5 mg via ORAL
  Filled 2011-12-02 (×2): qty 1

## 2011-12-02 MED ORDER — METOPROLOL TARTRATE 1 MG/ML IV SOLN
5.0000 mg | Freq: Once | INTRAVENOUS | Status: AC
  Administered 2011-12-02: 2.5 mg via INTRAVENOUS

## 2011-12-02 MED ORDER — ASPIRIN EC 81 MG PO TBEC: 81.0000 mg | DELAYED_RELEASE_TABLET | Freq: Every day | ORAL | Status: DC

## 2011-12-02 MED ORDER — ASPIRIN 81 MG PO CHEW
81.0000 mg | CHEWABLE_TABLET | Freq: Every day | ORAL | Status: DC
  Administered 2011-12-02 – 2011-12-06 (×5): 81 mg via ORAL
  Filled 2011-12-02 (×4): qty 1

## 2011-12-02 MED ORDER — ATORVASTATIN CALCIUM 40 MG PO TABS: 40.0000 mg | ORAL_TABLET | Freq: Every day | ORAL | Status: DC

## 2011-12-02 NOTE — Progress Notes (Signed)
CRITICAL VALUE ALERT  Critical value received:  CKMB 64.7, Trop 2.09, INR 5.09  Date of notification:  12/02/11  Time of notification:  1525  Critical value read back:yes  Nurse who received alert:  Dayna Barker, RN  MD notified (1st page):  Corine Shelter, [place]  Time of first page:  1530  MD notified (2nd page):  Time of second page:  Responding MD:  Corine Shelter, PA  Time MD responded:  [phone]

## 2011-12-02 NOTE — ED Notes (Signed)
Pt having sternal chest pain x approximately 14 days.  Some SOB.  Pt states pain radiates to back of both arms, and into ears.  No N/V.

## 2011-12-02 NOTE — ED Notes (Signed)
Vital signs stable.  Denies pain at this time.

## 2011-12-02 NOTE — ED Notes (Signed)
Pt arrived to room PDA 7. Dr. Judd Lien at the bedside for evaluation of the pt. Awaiting arrival of the second cath team to transfer to cath lab.

## 2011-12-02 NOTE — Progress Notes (Addendum)
ANTICOAGULATION CONSULT NOTE - Initial Consult  Pharmacy Consult for warfarin Indication: pulmonary embolus  Allergies  Allergen Reactions  . Erythromycin Nausea Only    Patient Measurements: Height: 6' (182.9 cm) Weight: 195 lb 15.8 oz (88.9 kg) IBW/kg (Calculated) : 77.6   Vital Signs: Temp: 98.7 F (37.1 C) (02/23 1135) Temp src: Oral (02/23 1135) BP: 122/78 mmHg (02/23 1400) Pulse Rate: 64  (02/23 1400)  Labs:  Basename 12/02/11 1045 12/02/11 1040  HGB 20.2* --  HCT 57.6* --  PLT 426* --  APTT -- 70*  LABPROT -- 26.6*  INR -- 2.41*  HEPARINUNFRC -- --  CREATININE -- 1.10  CKTOTAL -- --  CKMB -- --  TROPONINI -- 1.87*   Estimated Creatinine Clearance: 70.5 ml/min (by C-G formula based on Cr of 1.1).  Medical History: Past Medical History  Diagnosis Date  . Hypothyroid   . Pulmonary embolism     Medications:  Prescriptions prior to admission  Medication Sig Dispense Refill  . levothyroxine (SYNTHROID, LEVOTHROID) 112 MCG tablet Take 112 mcg by mouth daily.      Marland Kitchen warfarin (COUMADIN) 5 MG tablet Take 5 mg by mouth daily.        Assessment: Gregory Blankenship is a 71 year old gentleman on warfarin for history of pulmonary embolism with goal INR 2.5-3. Home regimen is 5 mg daily to the best of the patient's knowledge, will give 5 mg dose this evening for INR 2.41 on admission. Call to wife is pending return for accurate dose, but feel confident that 5 mg will not be too much for patient this evening. Goal of Therapy:  INR 2.5-3   Plan:  1. Warfarin 5 mg po x1 this evening 2. Daily PT/INR 3. Warfarin book/video 4. F/U wife's phone call for accurate dose  Blankenship, Gregory R 12/02/2011,2:11 PM   Update: 1600 nurse called informing that INR is >5 after cath. This is expected with bivalirudin, so I will recheck INR tonight with second set of CE at 1955. Please give 5 mg warfarin dose if INR is back in the therapeutic range. Thanks! - Gregory R. Blankenship,  PharmD  Update:  INR recheck 2000 3.0 still elevated from bivalrudin earlier today.  To prevent INR drop < 2 will give small Coumadin dose 2mg  x1. [place].D.

## 2011-12-02 NOTE — ED Provider Notes (Signed)
Patient transferred here from [facility] for intervention of acute MI.  Cath Lab occupied with another AMI, awaiting second cath team to come in.  Has been having pain off and on for the past 10 days, worse today.  Appears stable.  Heart RRR and Lungs CTAB.  Cath lab arrived, patient to be transported for intervention.  Geoffery Lyons, MD 12/02/11 1146

## 2011-12-02 NOTE — Progress Notes (Signed)
Chaplain's Note:  Responded to code stemi.  Spoke with pt and confirmed family was following.  Located wife Andrey Campanile) and escorted her back to pt's room.  Escorted family to cath lab consultation room and provided emotional support.  Please page if needed or requested.    12/02/11 1445  Clinical Encounter Type  Visited With Patient and family together  Visit Type Psychological support;Patient in surgery  Referral From Nurse  Spiritual Encounters  Spiritual Needs Emotional  Stress Factors  Patient Stress Factors Major life changes  Family Stress Factors Lack of knowledge;Health changes;Loss of control;Major life changes   [employer]  [phone]  On-call

## 2011-12-02 NOTE — ED Notes (Signed)
2.5 mg metoprolol given and did not give total of 5mg  ordered since HR dropped into 50bpm

## 2011-12-02 NOTE — H&P (Signed)
Patient ID: Gregory Blankenship MRN: [id number], DOB/AGE: [birth year]   Admit date: 12/02/2011   Primary Physician: No primary provider on file. Primary Cardiologist: Dr Allyson Sabal  (new)  HPI: Patient is a 71 year old male followed by Dr. Renford Dills. He has no prior history of coronary disease. He has a history of prostate cancer. He underwent robotic prostatectomy in September of 2011. Shortly thereafter he developed fairly extensive bilateral pulmonary embolism. He's been placed on Coumadin and has been on Coumadin since. He presented to the emergency room this morning and [place]. He's actually been having chest pain off and on for 10 days. He saw his primary care doctor and and EKG looked okay. He had trouble sleeping last night because of his pain. He denies any shortness of breath nausea vomiting or diaphoresis. He has had some radiation to his arms and up into his jaw and ears. At [facility] emergency room his EKG showed anterior ST elevation and poor anterior R-wave progression. A code STEMI was called. The patient was transferred urgently to the cath lab at [facility] hospital. He is currently pain-free.   Problem List: Past Medical History  Diagnosis Date  . Hypothyroid   . Pulmonary embolism     History reviewed. No pertinent past surgical history.   Allergies:  Allergies  Allergen Reactions  . Erythromycin      Home Medications Prescriptions prior to admission  Medication Sig Dispense Refill  . levothyroxine (SYNTHROID, LEVOTHROID) 112 MCG tablet Take 112 mcg by mouth daily.      Marland Kitchen warfarin (COUMADIN) 5 MG tablet Take 5 mg by mouth daily.         History reviewed. No pertinent family history.   History   Social History  . Marital Status: Married    Spouse Name: N/A    Number of Children: N/A  . Years of Education: N/A   Occupational History  . Not on file.   Social History Main Topics  . Smoking status: Former Games developer  . Smokeless tobacco: Not on file  . Alcohol Use:  No  . Drug Use:   . Sexually Active:    Other Topics Concern  . Not on file   Social History Narrative  . No narrative on file     Review of Systems: General: negative for chills, fever, night sweats or weight changes.  Cardiovascular: negative for chest pain, dyspnea on exertion, edema, orthopnea, palpitations, paroxysmal nocturnal dyspnea or shortness of breath Dermatological: negative for rash Respiratory: negative for cough or wheezing Urologic: negative for hematuria Abdominal: negative for nausea, vomiting, diarrhea, bright red blood per rectum, melena, or hematemesis Neurologic: negative for visual changes, syncope, or dizziness He has recently had an elevated WBC count and his primary care MD had referred him to a Hematologist.  Physical Exam: Blood pressure 124/84, pulse 70, temperature 98.7 F (37.1 C), temperature source Oral, resp. rate 18, height 6' (1.829 m), weight 85.276 kg (188 lb), SpO2 99.00%.  General appearance: alert, cooperative and no distress Neck: no JVD and supple, symmetrical, trachea midline Lungs: clear to auscultation bilaterally Heart: regular rate and rhythm Abdomen: not tender, not distended Extremities: no edema Pulses: 2+ and symmetric Skin: cool and dry Neurologic: Grossly normal    Labs:   Results for orders placed during the hospital encounter of 12/02/11 (from the past 24 hour(s))  BASIC METABOLIC PANEL     Status: Abnormal   Collection Time   12/02/11 10:40 AM      Component Value  Range   Sodium 138  135 - 145 (mEq/L)   Potassium 4.3  3.5 - 5.1 (mEq/L)   Chloride 101  96 - 112 (mEq/L)   CO2 26  19 - 32 (mEq/L)   Glucose, Bld 107 (*) 70 - 99 (mg/dL)   BUN 18  6 - 23 (mg/dL)   Creatinine, Ser 1.61  0.50 - 1.35 (mg/dL)   Calcium 09.6  8.4 - 10.5 (mg/dL)   GFR calc non Af Amer 67 (*) >90 (mL/min)   GFR calc Af Amer 78 (*) >90 (mL/min)  PRO B NATRIURETIC PEPTIDE     Status: Abnormal   Collection Time   12/02/11 10:40 AM       Component Value Range   Pro B Natriuretic peptide (BNP) 3684.0 (*) 0 - 125 (pg/mL)  TROPONIN I     Status: Abnormal   Collection Time   12/02/11 10:40 AM      Component Value Range   Troponin I 1.87 (*) <0.30 (ng/mL)  PROTIME-INR     Status: Abnormal   Collection Time   12/02/11 10:40 AM      Component Value Range   Prothrombin Time 26.6 (*) 11.6 - 15.2 (seconds)   INR 2.41 (*) 0.00 - 1.49   APTT     Status: Abnormal   Collection Time   12/02/11 10:40 AM      Component Value Range   aPTT 70 (*) 24 - 37 (seconds)  CBC     Status: Abnormal   Collection Time   12/02/11 10:45 AM      Component Value Range   WBC 26.2 (*) 4.0 - 10.5 (K/uL)   RBC 7.58 (*) 4.22 - 5.81 (MIL/uL)   Hemoglobin 20.2 (*) 13.0 - 17.0 (g/dL)   HCT 04.5 (*) 40.9 - 52.0 (%)   MCV 76.0 (*) 78.0 - 100.0 (fL)   MCH 26.6  26.0 - 34.0 (pg)   MCHC 35.1  30.0 - 36.0 (g/dL)   RDW 81.1 (*) 91.4 - 15.5 (%)   Platelets 426 (*) 150 - 400 (K/uL)     Radiology/Studies: No results found.  EKG:NSR with ant st elevation and poor ant r-wave.  ASSESSMENT AND PLAN:  Principal Problem:  *STEMI (ST elevation myocardial infarction) Active Problems:  Pulmonary embolism, 9/11 after prostate surgery  Chronic anticoagulation  Leukocytosis  Hypothyroid, treated  Plan- urgent cath  Deland Pretty, PA-C 12/02/2011, 12:24 PM

## 2011-12-02 NOTE — ED Provider Notes (Signed)
History     CSN: [id number]  Arrival date & time 12/02/11  1026   First MD Initiated Contact with Patient 12/02/11 1048      Chief Complaint  Patient presents with  . Chest Pain    (Consider location/radiation/quality/duration/timing/severity/associated sxs/prior treatment) HPI Comments: Patient presents to the emergency department with complaints of substernal chest pain for the last 2 weeks.  He does note that he just returned from [place] 2 weeks ago and has had this substernal chest pain and did see his primary care physician about 10 days ago and was told his EKG looked fine and otherwise they have no acute abnormalities.  Patient's symptoms have remained persistent and worsened last night the patient had difficulty sleeping.  He presents today due to this.  He has no shortness of breath, nausea, vomiting or diaphoresis with this.  He does note bilateral posterior arm pain.  He is a notes bilateral ear pain.  He quit smoking when he was 71 years old.  Patient is a 71 y.o. male presenting with chest pain. The history is provided by the patient. No language interpreter was used.  Chest Pain The chest pain began 1 - 2 weeks ago. Chest pain occurs intermittently. The chest pain is worsening. The severity of the pain is moderate. The quality of the pain is described as aching and pressure-like. The pain radiates to the left arm and right arm. Pertinent negatives for primary symptoms include no fever, no fatigue, no syncope, no shortness of breath, no cough, no wheezing, no palpitations, no abdominal pain, no nausea, no vomiting, no dizziness and no altered mental status.  Pertinent negatives for associated symptoms include no claudication, no diaphoresis, no lower extremity edema, no near-syncope, no numbness, no orthopnea, no paroxysmal nocturnal dyspnea and no weakness. He tried antacids for the symptoms. Risk factors include male gender.  His past medical history is significant for PE.      Past Medical History  Diagnosis Date  . Hypothyroid   . Pulmonary embolism     History reviewed. No pertinent past surgical history.  History reviewed. No pertinent family history.  History  Substance Use Topics  . Smoking status: Former Games developer  . Smokeless tobacco: Not on file  . Alcohol Use: No      Review of Systems  Constitutional: Negative.  Negative for fever, chills, diaphoresis and fatigue.  HENT: Negative.   Eyes: Negative.  Negative for discharge and redness.  Respiratory: Negative.  Negative for cough, shortness of breath and wheezing.   Cardiovascular: Positive for chest pain. Negative for palpitations, orthopnea, claudication, syncope and near-syncope.  Gastrointestinal: Negative.  Negative for nausea, vomiting and abdominal pain.  Genitourinary: Negative.  Negative for hematuria.  Musculoskeletal: Negative for back pain.  Skin: Negative.  Negative for color change and rash.  Neurological: Negative for dizziness, syncope, weakness, numbness and headaches.  Hematological: Negative.  Negative for adenopathy.  Psychiatric/Behavioral: Negative.  Negative for confusion and altered mental status.  All other systems reviewed and are negative.    Allergies  Erythromycin  Home Medications   Current Outpatient Rx  Name Route Sig Dispense Refill  . LEVOTHYROXINE SODIUM 112 MCG PO TABS Oral Take 112 mcg by mouth daily.    . WARFARIN SODIUM 5 MG PO TABS Oral Take 5 mg by mouth daily.      BP 154/103  Pulse 80  Resp 22  SpO2 100%  Physical Exam  Nursing note and vitals reviewed. Constitutional: He is oriented  to person, place, and time. He appears well-developed and well-nourished.  Non-toxic appearance. He does not have a sickly appearance.  HENT:  Head: Normocephalic and atraumatic.  Eyes: Conjunctivae, EOM and lids are normal. Pupils are equal, round, and reactive to light.  Neck: Trachea normal, normal range of motion and full passive range of  motion without pain. Neck supple.  Cardiovascular: Normal rate, regular rhythm, S1 normal, S2 normal and normal heart sounds.  Exam reveals no gallop.   No murmur heard. Pulmonary/Chest: Effort normal and breath sounds normal. No respiratory distress. He has no wheezes. He has no rales.  Abdominal: Soft. Normal appearance. He exhibits no distension. There is no tenderness. There is no rebound and no CVA tenderness.  Musculoskeletal: Normal range of motion.  Neurological: He is alert and oriented to person, place, and time. He has normal strength.  Skin: Skin is warm, dry and intact. No rash noted.  Psychiatric: He has a normal mood and affect. His behavior is normal. Judgment and thought content normal.    ED Course  Procedures (including critical care time)   Labs Reviewed  BASIC METABOLIC PANEL  PRO B NATRIURETIC PEPTIDE  TROPONIN I  CBC  PROTIME-INR  APTT   No results found.   1. STEMI (ST elevation myocardial infarction)     Date: 12/02/2011  Rate: 78  Rhythm: normal sinus rhythm  QRS Axis: left  Intervals: normal  ST/T Wave abnormalities: ST elevations anteriorly  Conduction Disutrbances:none  Narrative Interpretation:   Old EKG Reviewed: changes noted from 06/29/2010     MDM  EKG was done promptly and a code STEMI was called given his new EKG changes concerning for an anterior ST elevation MI.  I did speak with Dr. Tresa Endo from cardiology regarding transfer this patient to [facility] to go emergently to the cardiac catheter lab for further evaluation.  He'll be calling in a second team associated with Dr. Gery Pray as he is already taking the patient to the Cath Lab at this time.  Patient did receive aspirin here.  We did begin giving sublingual nitroglycerin due to patient's symptoms and his slightly elevated blood pressure but patient did have a hypotensive episode with brief bradycardia so no further tablets were given.  He should had not even received his full dose of  metoprolol at the time of this event so it is unlikely that the Toprol was the cause of any bradycardia.  Patient is on Coumadin has not been given heparin at this time.  He is receiving a fluid bolus given his hypotensive episode and responding well to that.  EMS is here to emergently transported him to [facility].  CRITICAL CARE Performed by: Emeline General A   Total critical care time: 40 minutes  Critical care time was exclusive of separately billable procedures and treating other patients.  Critical care was necessary to treat or prevent imminent or life-threatening deterioration.  Critical care was time spent personally by me on the following activities: development of treatment plan with patient and/or surrogate as well as nursing, discussions with consultants, evaluation of patient's response to treatment, examination of patient, obtaining history from patient or surrogate, ordering and performing treatments and interventions, ordering and review of laboratory studies, ordering and review of radiographic studies, pulse oximetry and re-evaluation of patient's condition.       Nat Christen, MD 12/02/11 1102

## 2011-12-02 NOTE — H&P (Signed)
.    Pt was reexamined and existing H & P reviewed. No changes found.  Runell Gess, MD [facility] 12/02/2011 1:17 PM

## 2011-12-02 NOTE — ED Notes (Addendum)
After 2.5 mg Lopressor,  Pt became diaphoretic, stated he did not feel well, nauseated, HR dropped to 43 in SR.  Stopped Lopressor dose, Opened NS wide, decreased head of bed, and increased Oxygen to 6L per [place].  Crash placed at bedside and Dr. Golda Acre called to room.  HR increased to 68, in SR after approx one minute.  Pt states he began to feel better.  [facility] here to transport pt.

## 2011-12-02 NOTE — ED Notes (Signed)
Cath lab called , they are ready for the pt.  Pt packaged and transported to the cath lab via stretcher.

## 2011-12-02 NOTE — Op Note (Signed)
Gregory Blankenship is a 71 y.o. male    [id number] LOCATION:  FACILITY: [facility]  PHYSICIAN: Nanetta Batty, M.D. 12/20/1940   DATE OF PROCEDURE:  12/02/2011  DATE OF DISCHARGE:  [facility]  CARDIAC CATHETERIZATION     History obtained from chart review. Patient is a 71 year old male followed by Dr. Renford Dills. He has no prior history of coronary disease. He has a history of prostate cancer. He underwent robotic prostatectomy in September of 2011. Shortly thereafter he developed fairly extensive bilateral pulmonary embolism. He's been placed on Coumadin and has been on Coumadin since. He presented to the emergency room this morning and [place]. He's actually been having chest pain off and on for 10 days. He saw his primary care doctor and and EKG looked okay. He had trouble sleeping last night because of his pain. He denies any shortness of breath nausea vomiting or diaphoresis. He has had some radiation to his arms and up into his jaw and ears. At [facility] emergency room his EKG showed anterior ST elevation and poor anterior R-wave progression. A code STEMI was called. The patient was transferred urgently to the cath lab at [facility] hospital. He is currently pain-free.    PROCEDURE DESCRIPTION:    The patient was brought to the second floor  [place] [facility]iac cath lab in the postabsorptive state. He was  premedicated with IV Versed and fentanyl. His right wrist was prepped and shaved in usual sterile fashion. Xylocaine 1% was used  for local anesthesia. A 6 French sheath was inserted into the right radial  artery using standard Seldinger technique.a 6 [place] TIG catheter was used for selective coronary angiography a 6 French pigtail catheter was used for left ventriculography. Visipaque dye was used for the entirety of the case. Retrograde aorta, left ventricular and pulmonary pressures were recorded.   HEMODYNAMICS:    AO SYSTOLIC/AO DIASTOLIC: 94/65   LV  SYSTOLIC/LV DIASTOLIC: 94/19  ANGIOGRAPHIC RESULTS:   1. Left main; normal  2. LAD; totally occluded in its midportion after a large first septal perforator 3. Left circumflex; dominant and normal.  4. Right coronary artery; nondominant and normal 5. Left ventriculography; RAO left ventriculogram was performed using  25 mL of Visipaque dye at 12 mL/second. The overall LVEF estimated  35 %  With wall motion abnormalities notable for akinesia of the distal anterolateral wall dyskinesia of the apex  IMPRESSION:Mr. Fuqua has an occluded LAD and antero-apical dyskinesia with an EF in the 35% range. We will proceed with attempt at PCI and stenting of his mid LAD.  Procedure description: The patient received 4 baby aspirin in the emergency room, 600 mg of by mouth Plavix in the cath lab along with a bolus of Angiomax. The ACT was 5.5. The total contrast administered to the patient was 200 cc. Because his INR was 2.4 it was elected to proceed with the right radial approach. Using a 6 [place] XB LAD 3.5 cm guide catheter along with a 014/190 cm length Asahi soft wire and a 2.0 x 12 mm long emerge balloon angioplasty was performed. The balloon was then upsized to a 2.25 mm x 20 mm long emerge balloon and multiple overlapping inflations were performed beyond the total occlusion however I was not able to restore antegrade flow suggesting that this was a chronic  Total occlusion.  Final impression: Mr. 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 History was removed and the when necessary was placed on his right wrist chief patent hemostasis. The patient left stable condition. Coumadin will be continued because of his history of pulmonary embolism. He will be to low-dose aspirin, beta blocker, statin drug and ACE inhibitor. A 2-D echocardiogram to assess LV function on Monday in order to determine whether or not he will need a  life vest for prevention of protection of arrhythmia.    Runell Gess MD, [facility] 12/02/2011 2:09 PM

## 2011-12-02 NOTE — Progress Notes (Signed)
TR band deflation begun.  3cc air removed from band.  R radial site immediately began bleeding.  3cc air reinserted into band and hemostasis achieved. Will continue to monitor.

## 2011-12-03 LAB — CARDIAC PANEL(CRET KIN+CKTOT+MB+TROPI)
CK, MB: 57.7 ng/mL (ref 0.3–4.0)
Relative Index: 9.6 — ABNORMAL HIGH (ref 0.0–2.5)
Total CK: 601 U/L — ABNORMAL HIGH (ref 7–232)
Troponin I: 7.65 ng/mL (ref <0.30)

## 2011-12-03 LAB — HEMOGLOBIN A1C
Hgb A1c MFr Bld: 5.4 % (ref <5.7)
Mean Plasma Glucose: 108 mg/dL (ref <117)

## 2011-12-03 LAB — BASIC METABOLIC PANEL
BUN: 15 mg/dL (ref 6–23)
Chloride: 102 mEq/L (ref 96–112)
Creatinine, Ser: 1.24 mg/dL (ref 0.50–1.35)
GFR calc Af Amer: 67 mL/min — ABNORMAL LOW (ref >90)
GFR calc non Af Amer: 58 mL/min — ABNORMAL LOW (ref >90)
Potassium: 5.5 mEq/L — ABNORMAL HIGH (ref 3.5–5.1)

## 2011-12-03 LAB — LIPID PANEL
Cholesterol: 140 mg/dL (ref 0–200)
HDL: 36 mg/dL — ABNORMAL LOW (ref >39)
LDL Cholesterol: 84 mg/dL (ref 0–99)
Total CHOL/HDL Ratio: 3.9 RATIO
Triglycerides: 102 mg/dL (ref <150)
VLDL: 20 mg/dL (ref 0–40)

## 2011-12-03 LAB — CBC
HCT: 53.7 % — ABNORMAL HIGH (ref 39.0–52.0)
MCHC: 34.8 g/dL (ref 30.0–36.0)
MCV: 79.8 fL (ref 78.0–100.0)
Platelets: 367 10*3/uL (ref 150–400)
RDW: 16.4 % — ABNORMAL HIGH (ref 11.5–15.5)
WBC: 20.3 10*3/uL — ABNORMAL HIGH (ref 4.0–10.5)

## 2011-12-03 LAB — PROTIME-INR
INR: 2.76 — ABNORMAL HIGH (ref 0.00–1.49)
Prothrombin Time: 29.6 seconds — ABNORMAL HIGH (ref 11.6–15.2)

## 2011-12-03 LAB — TSH: TSH: 2.709 u[IU]/mL (ref 0.350–4.500)

## 2011-12-03 MED ORDER — ISOSORBIDE MONONITRATE 15 MG HALF TABLET
15.0000 mg | ORAL_TABLET | Freq: Every day | ORAL | Status: DC
  Administered 2011-12-03 – 2011-12-06 (×4): 15 mg via ORAL
  Filled 2011-12-03 (×4): qty 1

## 2011-12-03 MED ORDER — WARFARIN SODIUM 5 MG PO TABS
5.0000 mg | ORAL_TABLET | Freq: Once | ORAL | Status: AC
  Administered 2011-12-03: 5 mg via ORAL
  Filled 2011-12-03: qty 1

## 2011-12-03 NOTE — Progress Notes (Signed)
ANTICOAGULATION CONSULT NOTE - Initial Consult  Pharmacy Consult for warfarin Indication: pulmonary embolus  Allergies  Allergen Reactions  . Erythromycin Nausea Only    Patient Measurements: Height: 6' (182.9 cm) Weight: 195 lb 15.8 oz (88.9 kg) IBW/kg (Calculated) : 77.6   Vital Signs: Temp: 98.7 F (37.1 C) (02/24 0400) Temp src: Oral (02/24 0400) BP: 101/78 mmHg (02/24 0700) Pulse Rate: 67  (02/23 2300)  Labs:  Basename 12/03/11 0155 12/02/11 1955 12/02/11 1412 12/02/11 1045 12/02/11 1040  HGB 18.7* -- 19.1* -- --  HCT 53.7* -- 54.6* 57.6* --  PLT 367 -- 383 426* --  APTT -- -- 143* -- 70*  LABPROT 29.6* 31.9* 47.8* -- --  INR 2.76* 3.03* 5.09* -- --  HEPARINUNFRC -- -- -- -- --  CREATININE 1.24 -- 1.10 -- 1.10  CKTOTAL 601* 537* 494* -- --  CKMB 57.7* 61.5* 64.7* -- --  TROPONINI 7.65* 5.07* 2.09* -- --   Estimated Creatinine Clearance: 62.6 ml/min (by C-G formula based on Cr of 1.24).  Medical History: Past Medical History  Diagnosis Date  . Hypothyroid   . Pulmonary embolism     Medications:  Prescriptions prior to admission  Medication Sig Dispense Refill  . levothyroxine (SYNTHROID, LEVOTHROID) 88 MCG tablet Take 88 mcg by mouth daily.      . simvastatin (ZOCOR) 20 MG tablet Take 20 mg by mouth every evening.      . warfarin (COUMADIN) 5 MG tablet Take 5 mg by mouth daily.        Assessment: Gregory Blankenship is a 71 year old gentleman on warfarin for history of pulmonary embolism with goal INR 2.5-3. Home regimen is 5 mg daily. Received 2 mg warfarin 2/23 evening. INR 2.76<---3.03 (2/23 PM)<---5.09 (s/p cath)<---2.41 on admission. Goal of Therapy:  INR 2.5-3   Plan:  1. Warfarin 5 mg po x1 this evening 2. Daily PT/INR  Gregory Blankenship, Gregory Blankenship 12/03/2011,9:24 AM

## 2011-12-03 NOTE — Progress Notes (Signed)
  Echocardiogram 2D Echocardiogram has been performed.  Gazella Anglin, Real [facility] 12/03/2011, 11:32 AM

## 2011-12-03 NOTE — Progress Notes (Signed)
Notified Md. Pt's Potassium 5.5 AM labs.  Vs stable.  New orders received, will continue to monitor. Emilie [place] Liter

## 2011-12-03 NOTE — Progress Notes (Signed)
Subjective:  No angina, no SOB  Objective:  Vital Signs in the last 24 hours: Temp:  [97.9 F (36.6 C)-98.9 F (37.2 C)] 98.7 F (37.1 C) (02/24 0400) Pulse Rate:  [55-80] 67  (02/23 2300) Resp:  [12-22] 15  (02/23 1430) BP: (83-154)/(47-103) 108/70 mmHg (02/24 0600) SpO2:  [94 %-100 %] 95 % (02/24 0600) Weight:  [85.276 kg (188 lb)-88.9 kg (195 lb 15.8 oz)] 88.9 kg (195 lb 15.8 oz) (02/23 1345)  Intake/Output from previous day:  Intake/Output Summary (Last 24 hours) at 12/03/11 9604 Last data filed at 12/03/11 0600  Gross per 24 hour  Intake 1492.83 ml  Output   2000 ml  Net -507.17 ml    Physical Exam: General appearance: alert, cooperative and no distress Lungs: clear to auscultation bilaterally Heart: regular rate and rhythm Rt wrist without hematoma   Rate: 70  Rhythm: normal sinus rhythm  Lab Results:  Basename 12/03/11 0155 12/02/11 1412  WBC 20.3* 24.4*  HGB 18.7* 19.1*  PLT 367 383    Basename 12/03/11 0155 12/02/11 1412  NA 138 134*  K 5.5* 4.7  CL 102 101  CO2 27 25  GLUCOSE 98 99  BUN 15 17  CREATININE 1.24 1.10    Basename 12/03/11 0155 12/02/11 1955  TROPONINI 7.65* 5.07*   Hepatic Function Panel  Basename 12/02/11 1412  PROT 6.8  ALBUMIN 3.4*  AST 67*  ALT 28  ALKPHOS 92  BILITOT 0.7  BILIDIR --  IBILI --    Basename 12/03/11 0155  CHOL 140    Basename 12/03/11 0155  INR 2.76*    Imaging: Imaging results have been reviewed, no CXR done  Cardiac Studies:  Assessment/Plan:   Principal Problem:  *STEMI, chronically occluded LAD at cath, no PCI  Active Problems:  Pulmonary embolism, 9/11 after prostate surgery  Chronic anticoagulation  Leukocytosis  CAD, 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 He needs CXR prior to discharge.    Corine Shelter  PA-C 12/03/2011, 8:08 AM  Agree with note written by Corine Shelter PAC  CP markedly improved. EKG evolving with markedly improved ST elevation , anterolateral TWI and Qs out to V4 suggesting a completed Anterior MI. Exam benign. Wrist OK. Troponin increased from 2.09 to 7.65. The LAD appeared chronically occluded without reperfusion despite multiple balloon inflations throughout the entire mid and distal LAD beyond the occlusion. He had anteroapical dyskinesia c/w aneurysm formation. No arhrythmias. On appropriate meds. WBC still increased and will still need to see a hematologist. Perhaps we can get this done while he is here. Transfer to tele tomorrow.   Runell Gess 12/03/2011 8:47 AM

## 2011-12-04 ENCOUNTER — Inpatient Hospital Stay (HOSPITAL_COMMUNITY): Payer: Medicare Other

## 2011-12-04 LAB — CARDIAC PANEL(CRET KIN+CKTOT+MB+TROPI)
CK, MB: 18.8 ng/mL (ref 0.3–4.0)
CK, MB: 14.9 ng/mL (ref 0.3–4.0)
Relative Index: 5.3 — ABNORMAL HIGH (ref 0.0–2.5)
Total CK: 353 U/L — ABNORMAL HIGH (ref 7–232)
Total CK: 319 U/L — ABNORMAL HIGH (ref 7–232)

## 2011-12-04 LAB — POCT ACTIVATED CLOTTING TIME: Activated Clotting Time: 545 seconds

## 2011-12-04 MED ORDER — WARFARIN SODIUM 7.5 MG PO TABS
7.5000 mg | ORAL_TABLET | Freq: Once | ORAL | Status: AC
  Administered 2011-12-04: 7.5 mg via ORAL
  Filled 2011-12-04: qty 1

## 2011-12-04 MED ORDER — MAGNESIUM HYDROXIDE 400 MG/5ML PO SUSP
30.0000 mL | Freq: Every day | ORAL | Status: DC | PRN
  Administered 2011-12-04: 30 mL via ORAL
  Filled 2011-12-04: qty 30

## 2011-12-04 MED ORDER — ALUM & MAG HYDROXIDE-SIMETH 200-200-20 MG/5ML PO SUSP
15.0000 mL | ORAL | Status: DC | PRN
  Filled 2011-12-04: qty 30

## 2011-12-04 MED FILL — Dextrose Inj 5%: INTRAVENOUS | Qty: 50 | Status: AC

## 2011-12-04 NOTE — Progress Notes (Signed)
Subjective:  No CP/SOB  Objective:  Temp:  [98.1 F (36.7 C)-100.1 F (37.8 C)] 98.3 F (36.8 C) (02/25 0733) BP: (87-110)/(56-73) 107/73 mmHg (02/25 0600) SpO2:  [91 %-98 %] 92 % (02/25 0430) Weight change:   Intake/Output from previous day: 02/24 0701 - 02/25 0700 In: 1440 [P.O.:1440] Out: 2800 [Urine:2800]  Intake/Output from this shift:    Physical Exam: General appearance: alert and cooperative Neck: no adenopathy, no carotid bruit, no JVD, supple, symmetrical, trachea midline and thyroid not enlarged, symmetric, no tenderness/mass/nodules Lungs: clear to auscultation bilaterally Heart: regular rate and rhythm, S1, S2 normal, no murmur, click, rub or gallop Extremities: extremities normal, atraumatic, no cyanosis or edema right radial puncture site OK  Lab Results: Results for orders placed during the hospital encounter of 12/02/11 (from the past 48 hour(s))  BASIC METABOLIC PANEL     Status: Abnormal   Collection Time   12/02/11 10:40 AM      Component Value Range Comment   Sodium 138  135 - 145 (mEq/L)    Potassium 4.3  3.5 - 5.1 (mEq/L)    Chloride 101  96 - 112 (mEq/L)    CO2 26  19 - 32 (mEq/L)    Glucose, Bld 107 (*) 70 - 99 (mg/dL)    BUN 18  6 - 23 (mg/dL)    Creatinine, Ser 1.61  0.50 - 1.35 (mg/dL)    Calcium 09.6  8.4 - 10.5 (mg/dL)    GFR calc non Af Amer 67 (*) >90 (mL/min)    GFR calc Af Amer 78 (*) >90 (mL/min)   PRO B NATRIURETIC PEPTIDE     Status: Abnormal   Collection Time   12/02/11 10:40 AM      Component Value Range Comment   Pro B Natriuretic peptide (BNP) 3684.0 (*) 0 - 125 (pg/mL)   TROPONIN I     Status: Abnormal   Collection Time   12/02/11 10:40 AM      Component Value Range Comment   Troponin I 1.87 (*) <0.30 (ng/mL)   PROTIME-INR     Status: Abnormal   Collection Time   12/02/11 10:40 AM      Component Value Range Comment   Prothrombin Time 26.6 (*) 11.6 - 15.2 (seconds)    INR 2.41 (*) 0.00 - 1.49    APTT     Status: Abnormal    Collection Time   12/02/11 10:40 AM      Component Value Range Comment   aPTT 70 (*) 24 - 37 (seconds)   CBC     Status: Abnormal   Collection Time   12/02/11 10:45 AM      Component Value Range Comment   WBC 26.2 (*) 4.0 - 10.5 (K/uL)    RBC 7.58 (*) 4.22 - 5.81 (MIL/uL)    Hemoglobin 20.2 (*) 13.0 - 17.0 (g/dL)    HCT 04.5 (*) 40.9 - 52.0 (%)    MCV 76.0 (*) 78.0 - 100.0 (fL)    MCH 26.6  26.0 - 34.0 (pg)    MCHC 35.1  30.0 - 36.0 (g/dL)    RDW 81.1 (*) 91.4 - 15.5 (%)    Platelets 426 (*) 150 - 400 (K/uL)   MRSA PCR SCREENING     Status: Normal   Collection Time   12/02/11  1:54 PM      Component Value Range Comment   MRSA by PCR NEGATIVE  NEGATIVE    CARDIAC PANEL(CRET KIN+CKTOT+MB+TROPI)     Status:  Abnormal   Collection Time   12/02/11  2:12 PM      Component Value Range Comment   Total CK 494 (*) 7 - 232 (U/L)    CK, MB 64.7 (*) 0.3 - 4.0 (ng/mL)    Troponin I 2.09 (*) <0.30 (ng/mL)    Relative Index 13.1 (*) 0.0 - 2.5    PROTIME-INR     Status: Abnormal   Collection Time   12/02/11  2:12 PM      Component Value Range Comment   Prothrombin Time 47.8 (*) 11.6 - 15.2 (seconds)    INR 5.09 (*) 0.00 - 1.49    APTT     Status: Abnormal   Collection Time   12/02/11  2:12 PM      Component Value Range Comment   aPTT 143 (*) 24 - 37 (seconds)   CBC     Status: Abnormal   Collection Time   12/02/11  2:12 PM      Component Value Range Comment   WBC 24.4 (*) 4.0 - 10.5 (K/uL)    RBC 6.89 (*) 4.22 - 5.81 (MIL/uL)    Hemoglobin 19.1 (*) 13.0 - 17.0 (g/dL)    HCT 16.1 (*) 09.6 - 52.0 (%)    MCV 79.2  78.0 - 100.0 (fL)    MCH 27.7  26.0 - 34.0 (pg)    MCHC 35.0  30.0 - 36.0 (g/dL)    RDW 04.5 (*) 40.9 - 15.5 (%)    Platelets 383  150 - 400 (K/uL)   DIFFERENTIAL     Status: Abnormal   Collection Time   12/02/11  2:12 PM      Component Value Range Comment   Neutrophils Relative 86 (*) 43 - 77 (%)    Neutro Abs 21.0 (*) 1.7 - 7.7 (K/uL)    Lymphocytes Relative 6 (*) 12 -  46 (%)    Lymphs Abs 1.5  0.7 - 4.0 (K/uL)    Monocytes Relative 7  3 - 12 (%)    Monocytes Absolute 1.7 (*) 0.1 - 1.0 (K/uL)    Eosinophils Relative 1  0 - 5 (%)    Eosinophils Absolute 0.1  0.0 - 0.7 (K/uL)    Basophils Relative 0  0 - 1 (%)    Basophils Absolute 0.1  0.0 - 0.1 (K/uL)   TSH     Status: Normal   Collection Time   12/02/11  2:12 PM      Component Value Range Comment   TSH 2.709  0.350 - 4.500 (uIU/mL)   COMPREHENSIVE METABOLIC PANEL     Status: Abnormal   Collection Time   12/02/11  2:12 PM      Component Value Range Comment   Sodium 134 (*) 135 - 145 (mEq/L)    Potassium 4.7  3.5 - 5.1 (mEq/L)    Chloride 101  96 - 112 (mEq/L)    CO2 25  19 - 32 (mEq/L)    Glucose, Bld 99  70 - 99 (mg/dL)    BUN 17  6 - 23 (mg/dL)    Creatinine, Ser 8.11  0.50 - 1.35 (mg/dL)    Calcium 9.2  8.4 - 10.5 (mg/dL)    Total Protein 6.8  6.0 - 8.3 (g/dL)    Albumin 3.4 (*) 3.5 - 5.2 (g/dL)    AST 67 (*) 0 - 37 (U/L)    ALT 28  0 - 53 (U/L)    Alkaline Phosphatase 92  39 - 117 (  U/L)    Total Bilirubin 0.7  0.3 - 1.2 (mg/dL)    GFR calc non Af Amer 67 (*) >90 (mL/min)    GFR calc Af Amer 78 (*) >90 (mL/min)   MAGNESIUM     Status: Normal   Collection Time   12/02/11  2:12 PM      Component Value Range Comment   Magnesium 1.8  1.5 - 2.5 (mg/dL)   HEMOGLOBIN Z6X     Status: Normal   Collection Time   12/02/11  2:12 PM      Component Value Range Comment   Hemoglobin A1C 5.4  <5.7 (%)    Mean Plasma Glucose 108  <117 (mg/dL)   CARDIAC PANEL(CRET KIN+CKTOT+MB+TROPI)     Status: Abnormal   Collection Time   12/02/11  7:55 PM      Component Value Range Comment   Total CK 537 (*) 7 - 232 (U/L)    CK, MB 61.5 (*) 0.3 - 4.0 (ng/mL) CRITICAL VALUE NOTED.  VALUE IS CONSISTENT WITH PREVIOUSLY REPORTED AND CALLED VALUE.   Troponin I 5.07 (*) <0.30 (ng/mL)    Relative Index 11.5 (*) 0.0 - 2.5    PROTIME-INR     Status: Abnormal   Collection Time   12/02/11  7:55 PM      Component Value  Range Comment   Prothrombin Time 31.9 (*) 11.6 - 15.2 (seconds)    INR 3.03 (*) 0.00 - 1.49    CARDIAC PANEL(CRET KIN+CKTOT+MB+TROPI)     Status: Abnormal   Collection Time   12/03/11  1:55 AM      Component Value Range Comment   Total CK 601 (*) 7 - 232 (U/L)    CK, MB 57.7 (*) 0.3 - 4.0 (ng/mL) CRITICAL VALUE NOTED.  VALUE IS CONSISTENT WITH PREVIOUSLY REPORTED AND CALLED VALUE.   Troponin I 7.65 (*) <0.30 (ng/mL)    Relative Index 9.6 (*) 0.0 - 2.5    PROTIME-INR     Status: Abnormal   Collection Time   12/03/11  1:55 AM      Component Value Range Comment   Prothrombin Time 29.6 (*) 11.6 - 15.2 (seconds)    INR 2.76 (*) 0.00 - 1.49    CBC     Status: Abnormal   Collection Time   12/03/11  1:55 AM      Component Value Range Comment   WBC 20.3 (*) 4.0 - 10.5 (K/uL)    RBC 6.73 (*) 4.22 - 5.81 (MIL/uL)    Hemoglobin 18.7 (*) 13.0 - 17.0 (g/dL)    HCT 09.6 (*) 04.5 - 52.0 (%)    MCV 79.8  78.0 - 100.0 (fL)    MCH 27.8  26.0 - 34.0 (pg)    MCHC 34.8  30.0 - 36.0 (g/dL)    RDW 40.9 (*) 81.1 - 15.5 (%)    Platelets 367  150 - 400 (K/uL)   BASIC METABOLIC PANEL     Status: Abnormal   Collection Time   12/03/11  1:55 AM      Component Value Range Comment   Sodium 138  135 - 145 (mEq/L)    Potassium 5.5 (*) 3.5 - 5.1 (mEq/L)    Chloride 102  96 - 112 (mEq/L)    CO2 27  19 - 32 (mEq/L)    Glucose, Bld 98  70 - 99 (mg/dL)    BUN 15  6 - 23 (mg/dL)    Creatinine, Ser 9.14  0.50 - 1.35 (mg/dL)  Calcium 9.3  8.4 - 10.5 (mg/dL)    GFR calc non Af Amer 58 (*) >90 (mL/min)    GFR calc Af Amer 67 (*) >90 (mL/min)   LIPID PANEL     Status: Abnormal   Collection Time   12/03/11  1:55 AM      Component Value Range Comment   Cholesterol 140  0 - 200 (mg/dL)    Triglycerides 010  <150 (mg/dL)    HDL 36 (*) >27 (mg/dL)    Total CHOL/HDL Ratio 3.9      VLDL 20  0 - 40 (mg/dL)    LDL Cholesterol 84  0 - 99 (mg/dL)     Imaging: Imaging results have been reviewed  Assessment/Plan:    1. Principal Problem: 2.  *STEMI (ST elevation myocardial infarction) 3. Active Problems: 4.  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  Chronic anticoagulation 6.  Leukocytosis 7.  Hypothyroid, treated 8.  CAD, chronically occluded LAD at cath 9.  Cardiomyopathy, ischemic, EF 35% at cath 10.   Time Spent Directly with Patient:  20 minutes  Length of Stay:  LOS: 2 days   Day #2 ANT STEMI with attempt at PCI LAD but what appeared to be chronically occluded. 2D echo performed yesterday and results are pending. Await EF to decide re LifeVest. On appropriate meds. Exam benign. OK to transfer to tele. [facility]. EKG evolving. Pharmacy following INRs and coumadin dosing.  Runell Gess 12/04/2011, 8:33 AM

## 2011-12-04 NOTE — Progress Notes (Signed)
ANTICOAGULATION CONSULT NOTE - Follow Up Consult  Pharmacy Consult for coumadin Indication: history of PE  Allergies  Allergen Reactions  . Erythromycin Nausea Only    Patient Measurements: Height: 6' (182.9 cm) Weight: 195 lb 15.8 oz (88.9 kg) IBW/kg (Calculated) : 77.6   Vital Signs: Temp: 98.3 F (36.8 C) (02/25 0733) Temp src: Oral (02/25 0733) BP: 121/86 mmHg (02/25 0800)  Labs:  Gregory Blankenship 12/04/11 0910 12/03/11 0155 12/02/11 1955 12/02/11 1412 12/02/11 1045 12/02/11 1040  HGB -- 18.7* -- 19.1* -- --  HCT -- 53.7* -- 54.6* 57.6* --  PLT -- 367 -- 383 426* --  APTT -- -- -- 143* -- 70*  LABPROT 25.4* 29.6* 31.9* -- -- --  INR 2.27* 2.76* 3.03* -- -- --  HEPARINUNFRC -- -- -- -- -- --  CREATININE -- 1.24 -- 1.10 -- 1.10  CKTOTAL -- 601* 537* 494* -- --  CKMB -- 57.7* 61.5* 64.7* -- --  TROPONINI -- 7.65* 5.07* 2.09* -- --   Estimated Creatinine Clearance: 62.6 ml/min (by C-G formula based on Cr of 1.24).   Assessment: 71 yo male with history of PE (06/2010) on coumadin.  INR at admission was 5.09 but this was likely effect of bivalirudin during cath.  INRs have been on high end but now showing trend down (no missed doses and 2mg  given 2/23 due to INR on high end)  Goal of Therapy:  INR= 2.5-3.0   Plan:  -Coumadin 7.5mg  today due to INR goal of 2.5-3.0   Gregory Blankenship 12/04/2011,10:23 AM

## 2011-12-04 NOTE — Progress Notes (Signed)
Chaplain's Note:  Follow-up from code stemi on Saturday.  Pt and wife, Andrey Campanile, in good spirits.  Provided emotional support.  Please page if needed or requested.  Dellie Catholic  [phone] personal pager   [phone] on-call pager  12/04/11 1551  Clinical Encounter Type  Visited With Patient and family together  Visit Type Follow-up  Spiritual Encounters  Spiritual Needs Emotional  Stress Factors  Patient Stress Factors Major life changes  Family Stress Factors Major life changes

## 2011-12-04 NOTE — Progress Notes (Signed)
Pt evaluated for long term disease management services with [facility] program as a benefit of [employer]. RN case manager will do a post d/c transition of care call and home visits for assessments or CAD, cardiomyopathy and other needs. Program discussed with the pt and his wife at the bedside.  Brooke Bonito C. Reyanna Baley, RN, MS, CCM [facility] [phone]

## 2011-12-04 NOTE — Progress Notes (Signed)
UR Completed. Simmons, Clarence Cogswell F [phone]  

## 2011-12-04 NOTE — Progress Notes (Signed)
CARDIAC REHAB PHASE I   PRE:  Rate/Rhythm: 73 SR  BP:  Supine:   Sitting: 117/70  Standing:    SaO2: 96 RA  MODE:  Ambulation:700  ft   POST:  Rate/Rhythem: 83 SR  BP:  Supine:   Sitting: 116/74  Standing:    SaO2:  0945-1115  Tolerated ambulation well without c/o of cp or SOB. VS stable. Started discharge education with pt and wife. He agrees to [employer]. [facility] in [place], will send referral.  Beatrix Fetters

## 2011-12-05 ENCOUNTER — Other Ambulatory Visit: Payer: Self-pay | Admitting: Lab

## 2011-12-05 ENCOUNTER — Ambulatory Visit: Payer: Self-pay

## 2011-12-05 ENCOUNTER — Ambulatory Visit: Payer: Self-pay | Admitting: Oncology

## 2011-12-05 LAB — CARDIAC PANEL(CRET KIN+CKTOT+MB+TROPI)
CK, MB: 9.4 ng/mL (ref 0.3–4.0)
Relative Index: 4.3 — ABNORMAL HIGH (ref 0.0–2.5)
Troponin I: 5.77 ng/mL (ref <0.30)

## 2011-12-05 LAB — PROTIME-INR
INR: 2.32 — ABNORMAL HIGH (ref 0.00–1.49)
Prothrombin Time: 25.9 seconds — ABNORMAL HIGH (ref 11.6–15.2)

## 2011-12-05 MED ORDER — WARFARIN SODIUM 7.5 MG PO TABS
7.5000 mg | ORAL_TABLET | Freq: Once | ORAL | Status: AC
  Administered 2011-12-05: 7.5 mg via ORAL
  Filled 2011-12-05: qty 1

## 2011-12-05 NOTE — Progress Notes (Signed)
0800-0900 Cardiac Rehab Completed discharge education with pt and wife. They voice understanding. He is walking independently lin hall.Cardiac  Rehab goals met, will sign off.

## 2011-12-05 NOTE — Progress Notes (Signed)
Patient ambulated with wife 700 feet.  No complications.  Will continue to monitor patient.

## 2011-12-05 NOTE — Progress Notes (Signed)
Patient ambulated 800 feet, independently.  Tolerated well.  Returned to chair.  VSS.  Will continue to monitor.

## 2011-12-05 NOTE — Progress Notes (Signed)
ANTICOAGULATION CONSULT NOTE - Follow Up Consult  Pharmacy Consult:  Coumadin Indication: history of PE  Allergies  Allergen Reactions  . Erythromycin Nausea Only    Patient Measurements: Height: 6' (182.9 cm) Weight: 195 lb 15.8 oz (88.9 kg) IBW/kg (Calculated) : 77.6   Vital Signs: Temp: 98.5 F (36.9 C) (02/26 0458) Temp src: Oral (02/26 0458) BP: 108/66 mmHg (02/26 0458) Pulse Rate: 69  (02/26 0458)  Labs:  [name] 12/05/11 0525 12/05/11 0027 12/04/11 1714 12/04/11 0910 12/04/11 0901 12/03/11 0155 12/02/11 1412 12/02/11 1045 12/02/11 1040  HGB -- -- -- -- -- 18.7* 19.1* -- --  HCT -- -- -- -- -- 53.7* 54.6* 57.6* --  PLT -- -- -- -- -- 367 383 426* --  APTT -- -- -- -- -- -- 143* -- 70*  LABPROT 25.9* -- -- 25.4* -- 29.6* -- -- --  INR 2.32* -- -- 2.27* -- 2.76* -- -- --  HEPARINUNFRC -- -- -- -- -- -- -- -- --  CREATININE -- -- -- -- -- 1.24 1.10 -- 1.10  CKTOTAL -- 220 319* -- 353* -- -- -- --  CKMB -- 9.4* 14.9* -- 18.8* -- -- -- --  TROPONINI -- 5.77* 7.05* -- 8.37* -- -- -- --   Estimated Creatinine Clearance: 62.6 ml/min (by C-G formula based on Cr of 1.24).   Assessment: 16  YOM with history of PE (06/2010) on Coumadin.  INR at admission was 5.09 but this was likely the effect of bivalirudin during cath.  INR slightly below goal at 2.32 today, no bleeding noted.   Goal of Therapy:  INR = 2.5 - 3.0    Plan:  - Repeat Coumadin 7.5mg  PO today - Daily PT/INR - F/U 2D with contrast results   Phillips Climes, PharmD 12/05/2011,8:45 AM

## 2011-12-05 NOTE — Progress Notes (Signed)
Subjective:  No CP/SOB  Objective:  Temp:  [98.2 F (36.8 C)-98.8 F (37.1 C)] 98.5 F (36.9 C) (02/26 0458) Pulse Rate:  [62-69] 69  (02/26 0458) Resp:  [18] 18  (02/26 0458) BP: (94-116)/(56-76) 108/66 mmHg (02/26 0458) SpO2:  [97 %-98 %] 97 % (02/26 0458) Weight change:   Intake/Output from previous day: 02/25 0701 - 02/26 0700 In: 1080 [P.O.:1080] Out: 1550 [Urine:1550]  Intake/Output from this shift:    Physical Exam: General appearance: alert and cooperative Neck: no adenopathy, no carotid bruit, no JVD, supple, symmetrical, trachea midline and thyroid not enlarged, symmetric, no tenderness/mass/nodules Lungs: clear to auscultation bilaterally Heart: regular rate and rhythm, S1, S2 normal, no murmur, click, rub or gallop  Lab Results: Results for orders placed during the hospital encounter of 12/02/11 (from the past 48 hour(s))  CARDIAC PANEL(CRET KIN+CKTOT+MB+TROPI)     Status: Abnormal   Collection Time   12/04/11  9:01 AM      Component Value Range Comment   Total CK 353 (*) 7 - 232 (U/L)    CK, MB 18.8 (*) 0.3 - 4.0 (ng/mL) CRITICAL VALUE NOTED.  VALUE IS CONSISTENT WITH PREVIOUSLY REPORTED AND CALLED VALUE.   Troponin I 8.37 (*) <0.30 (ng/mL)    Relative Index 5.3 (*) 0.0 - 2.5    PROTIME-INR     Status: Abnormal   Collection Time   12/04/11  9:10 AM      Component Value Range Comment   Prothrombin Time 25.4 (*) 11.6 - 15.2 (seconds)    INR 2.27 (*) 0.00 - 1.49    CARDIAC PANEL(CRET KIN+CKTOT+MB+TROPI)     Status: Abnormal   Collection Time   12/04/11  5:14 PM      Component Value Range Comment   Total CK 319 (*) 7 - 232 (U/L)    CK, MB 14.9 (*) 0.3 - 4.0 (ng/mL) CRITICAL VALUE NOTED.  VALUE IS CONSISTENT WITH PREVIOUSLY REPORTED AND CALLED VALUE.   Troponin I 7.05 (*) <0.30 (ng/mL)    Relative Index 4.7 (*) 0.0 - 2.5    CARDIAC PANEL(CRET KIN+CKTOT+MB+TROPI)     Status: Abnormal   Collection Time   12/05/11 12:27 AM      Component Value Range Comment   Total CK 220  7 - 232 (U/L)    CK, MB 9.4 (*) 0.3 - 4.0 (ng/mL) CRITICAL VALUE NOTED.  VALUE IS CONSISTENT WITH PREVIOUSLY REPORTED AND CALLED VALUE.   Troponin I 5.77 (*) <0.30 (ng/mL)    Relative Index 4.3 (*) 0.0 - 2.5    PROTIME-INR     Status: Abnormal   Collection Time   12/05/11  5:25 AM      Component Value Range Comment   Prothrombin Time 25.9 (*) 11.6 - 15.2 (seconds)    INR 2.32 (*) 0.00 - 1.49      Imaging: Imaging results have been reviewed  Assessment/Plan:   1. Principal Problem: 2.  *STEMI (ST elevation myocardial infarction) 3. Active Problems: 4.  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  Chronic anticoagulation 6.  Leukocytosis 7.  Hypothyroid, treated 8.  CAD, chronically occluded LAD at cath 9.  Cardiomyopathy, ischemic, EF 35% at cath 10.   Time Spent Directly with Patient:  30 minutes  Length of Stay:  LOS: 3 days   Day #4 ANT STEMI s/p attempted PCI of mid LAD w/o reperfusion vis right radial. Trop decreasing. 2 D echo shows LVEF 35-40% with anteroapical wma. There is a question of LV  apical mural thrombus. Will get a limited 2D with contrast today to r/o. On appropriate meds. Pharm dosing and following INRs on coumadin A/C secondary to PE. I spoke with Drs Buckner Malta and Polite. No need for LifeVest at this time. Pt interested in [facility]. Prob OK for D/C home tomorrow. ROV with me 1-2 weeks.   Runell Gess 12/05/2011, 8:46 AM

## 2011-12-06 LAB — BASIC METABOLIC PANEL
BUN: 20 mg/dL (ref 6–23)
CO2: 29 mEq/L (ref 19–32)
Calcium: 9.6 mg/dL (ref 8.4–10.5)
Creatinine, Ser: 1.17 mg/dL (ref 0.50–1.35)
GFR calc non Af Amer: 62 mL/min — ABNORMAL LOW (ref >90)
Glucose, Bld: 88 mg/dL (ref 70–99)
Sodium: 136 mEq/L (ref 135–145)

## 2011-12-06 LAB — URINALYSIS, ROUTINE W REFLEX MICROSCOPIC
Bilirubin Urine: NEGATIVE
Hgb urine dipstick: NEGATIVE
Specific Gravity, Urine: 1.011 (ref 1.005–1.030)
Urobilinogen, UA: 1 mg/dL (ref 0.0–1.0)
pH: 6 (ref 5.0–8.0)

## 2011-12-06 MED ORDER — ATORVASTATIN CALCIUM 20 MG PO TABS: 20.0000 mg | ORAL_TABLET | Freq: Every day | ORAL | Status: DC

## 2011-12-06 MED ORDER — ACETAMINOPHEN 325 MG PO TABS: 650.0000 mg | ORAL_TABLET | ORAL | Status: AC | PRN

## 2011-12-06 MED ORDER — ASPIRIN 81 MG PO CHEW: 81.0000 mg | CHEWABLE_TABLET | Freq: Every day | ORAL | Status: AC

## 2011-12-06 MED ORDER — WARFARIN SODIUM 7.5 MG PO TABS
7.5000 mg | ORAL_TABLET | Freq: Once | ORAL | Status: DC
  Filled 2011-12-06: qty 1

## 2011-12-06 MED ORDER — WARFARIN - PHARMACIST DOSING INPATIENT
Freq: Every day | Status: DC
  Filled 2011-12-06: qty 1

## 2011-12-06 MED ORDER — CARVEDILOL 3.125 MG PO TABS: 3.1250 mg | ORAL_TABLET | Freq: Two times a day (BID) | ORAL | Status: DC

## 2011-12-06 MED ORDER — LISINOPRIL 5 MG PO TABS: 5.0000 mg | ORAL_TABLET | Freq: Every day | ORAL | Status: DC

## 2011-12-06 MED ORDER — NITROGLYCERIN 0.4 MG SL SUBL: 0.4000 mg | SUBLINGUAL_TABLET | SUBLINGUAL | Status: DC | PRN

## 2011-12-06 MED ORDER — ISOSORBIDE MONONITRATE 15 MG HALF TABLET: 15.0000 mg | ORAL_TABLET | Freq: Every day | ORAL | Status: DC

## 2011-12-06 NOTE — Progress Notes (Signed)
ANTICOAGULATION CONSULT NOTE - Follow Up Consult  Pharmacy Consult:  Coumadin Indication: history of PE  Allergies  Allergen Reactions  . Erythromycin Nausea Only    Patient Measurements: Height: 6' (182.9 cm) Weight: 195 lb 15.8 oz (88.9 kg) IBW/kg (Calculated) : 77.6   Vital Signs: Temp: 98.3 F (36.8 C) (02/27 0545) Temp src: Oral (02/27 0545) BP: 111/77 mmHg (02/27 0545) Pulse Rate: 62  (02/27 0545)  Labs:  Gregory Blankenship 12/06/11 0545 12/05/11 0525 12/05/11 0027 12/04/11 1714 12/04/11 0910 12/04/11 0901  HGB -- -- -- -- -- --  HCT -- -- -- -- -- --  PLT -- -- -- -- -- --  APTT -- -- -- -- -- --  LABPROT 28.2* 25.9* -- -- 25.4* --  INR 2.59* 2.32* -- -- 2.27* --  HEPARINUNFRC -- -- -- -- -- --  CREATININE -- -- -- -- -- --  CKTOTAL -- -- 220 319* -- 353*  CKMB -- -- 9.4* 14.9* -- 18.8*  TROPONINI -- -- 5.77* 7.05* -- 8.37*   Estimated Creatinine Clearance: 62.6 ml/min (by C-G formula based on Cr of 1.24).   Assessment: 36  YOM with history of PE (06/2010) on Coumadin.  Now day #5 s/p ant STEMI. 2D echo shows no apical thrombus.  INR at admission was 5.09 but this was likely the effect of bivalirudin during cath.  INR therapeutic today at 2.59 today, no bleeding noted. Home dose was 5mg  daily. He has rec'd 2-5-7.5-7.5 mg doses.     Goal of Therapy:  INR = 2.5 - 3.0    Plan:  - Repeat Coumadin 7.5mg  PO today - Daily PT/INR  [name] Pager: [phone]      12/06/2011,10:07 AM

## 2011-12-06 NOTE — Progress Notes (Signed)
Subjective: No chest pain. Did have an awareness of discomfort, very briefly last pm.   Objective: Vital signs in last 24 hours: Temp:  [97.7 F (36.5 C)-98.5 F (36.9 C)] 98.3 F (36.8 C) (02/27 0545) Pulse Rate:  [59-64] 62  (02/27 0545) Resp:  [18] 18  (02/27 0545) BP: (100-111)/(65-77) 111/77 mmHg (02/27 0545) SpO2:  [93 %-97 %] 93 % (02/27 0545) Weight change:  Last BM Date: 12/05/11 Intake/Output from previous day:+480 02/26 0701 - 02/27 0700 In: 480 [P.O.:480] Out: -  Intake/Output this shift:    PE: General: A&O X 3 MAE, follows commands. Neck:no JVD Heart:S1S2, RRR no murmur Lungs:clear Abd:+BS soft non tender Ext:no edema    Lab Results: No results found for this basename: WBC:2,HGB:2,HCT:2,PLT:2 in the last 72 hours BMET  Basename 12/05/11 0027 12/04/11 1714  TROPONINI 5.77* 7.05*    Lab Results  Component Value Date   CHOL 140 12/03/2011   HDL 36* 12/03/2011   LDLCALC 84 12/03/2011   TRIG 102 12/03/2011   CHOLHDL 3.9 12/03/2011   Lab Results  Component Value Date   HGBA1C 5.4 12/02/2011     Lab Results  Component Value Date   TSH 2.709 12/02/2011    Hepatic Function Panel No results found for this basename: PROT,ALBUMIN,AST,ALT,ALKPHOS,BILITOT,BILIDIR,IBILI in the last 72 hours No results found for this basename: CHOL in the last 72 hours No results found for this basename: PROTIME in the last 72 hours    EKG: Orders placed during the hospital encounter of 12/02/11  . ED EKG  . ED EKG  . EKG 12-LEAD  . EKG 12-LEAD  . EKG 12-LEAD  . EKG 12-LEAD  . EKG 12-LEAD  . EKG  . EKG 12-LEAD  . EKG 12-LEAD    Studies/Results: ECHO with contrast:  No evidence for apical thrombus. LAD territory infarct. Transthoracic echocardiography 2D Echo: Left ventricle: The cavity size was normal. Systolic function was moderately reduced. The estimated ejection fraction was in the range of 35% to 40%. There is severe hypokinesis of the  mid-distalanteroseptal, anterior, and apical myocardium; consistent with infarction. Doppler parameters are consistent with abnormal left ventricular relaxation (grade 1 diastolic dysfunction). Increased echogenicity at the apex; cannot exclude apical thrombus. Consider a definity contrast study to further evaluate. - Atrial septum: No defect or patent foramen ovale was identified.    No results found.  Medications: I have reviewed the patient's current medications.    Marland Kitchen aspirin  81 mg Oral Daily  . atorvastatin  20 mg Oral q1800  . carvedilol  3.125 mg Oral BID WC  . isosorbide mononitrate  15 mg Oral Daily  . levothyroxine  112 mcg Oral Daily  . lisinopril  2.5 mg Oral Daily  . warfarin  7.5 mg Oral ONCE-1800  . warfarin   Does not apply Once   Assessment/Plan: Patient Active Problem List  Diagnoses  . STEMI (ST elevation myocardial infarction)  . 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 Chronic anticoagulation  . Leukocytosis  . Hypothyroid, treated  . CAD, chronically occluded LAD at cath  . Cardiomyopathy, ischemic, EF 35% at cath   PLAN: STEMI with occluded LAD, unable to place stent or PTA. EF was 35 %, , No need for life vest.  BP borderline, plan will be to titrate meds as outpatient.  Leukocytosis first noted as outpatient, Dr. Nehemiah Settle is to send to [facility].  Continues on coumadin for Pul. Embolus. Discussed BP evaluations at home, maybe every other day for a  few days then once a week for a couple of weeks just for a baseline then only if weakness, lightheadedness. Prob. D/c today, plan for echo in 3 months. TIME SPENT  30 min.  LOS: 4 days   INGOLD,LAURA R 12/06/2011, 10:15 AM     Patient seen and examined. Agree with assessment and plan. Long discussion with pt and wife. No recurrent chest pain or sob. No signsd of CHF or pericarditis on exam.  Discussed cardiac rehab. Will DC today; titrate lisinopril to 5 mg.  Recommend a hypercoagulable workup  since pt occluded his LAD on coumadin RX.   Lennette Bihari, MD, [facility] 12/06/2011 1:42 PM

## 2011-12-06 NOTE — Discharge Instructions (Signed)
Myocardial Infarction A myocardial infarction (MI) is damage to the heart that is not reversible. It is also called a heart attack. An MI usually occurs when a heart (coronary) artery becomes blocked or narrowed. This cuts off the blood supply to the heart. When one or more of the heart (coronary) arteries becomes blocked, that area of the heart begins to die. This causes pain felt during an MI.  If you think you might be having an MI, call your local emergency services immediately (911 in [place] It is recommended that you take a 162 mg non-enteric coated aspirin if you do not have an aspirin allergy. Do not drive yourself to the hospital or wait to see if your symptoms go away. The sooner MI is treated, the greater the amount of heart muscle saved. Time is muscle. It can save your life. CAUSES  An MI can occur from:  A gradual buildup of a fatty substance called plaque. When plaque builds up in the arteries, this condition is called atherosclerosis. This buildup can block or reduce the blood supply to the heart artery(s).   A sudden plaque rupture within a heart artery that causes a blood clot (thrombus). A blood clot can block the heart artery which does not allow blood flow to the heart.   A severe tightening (spasm) of the heart artery. This is a less common cause of a heart attack. When a heart artery spasms, it cuts off blood flow through the artery. Spasms can occur in heart arteries that do not have atherosclerosis.  RISK FACTORS People at risk for an MI usually have one or more risk factors, such as:  High blood pressure.   High cholesterol.   Smoking.   Gender. Men have a higher heart attack risk.   Overweight/obesity.   Age.   Family history.   Lack of exercise.   Diabetes.   Stress.   Excessive alcohol use.   Street drug use (cocaine and methamphetamines).  SYMPTOMS  MI symptoms can vary, such as:  In both men and women, MI symptoms can include the following:    Chest pain. The chest pain may feel like a crushing, squeezing, or "pressure" type feeling. MI pain can be "referred," meaning pain can be caused in one part of the body but felt in another part of the body. Referred MI pain may occur in the left arm, neck, or jaw. Pain may even be felt in the right arm.   Shortness of breath (dyspnea).   Heartburn or indigestion with or without vomiting, shortness of breath, or sweating (diaphoresis).   Sudden, cold sweats.   Sudden lightheadedness.   Upper back pain.   Women can have unique MI symptoms, such as:   Unexplained feelings of nervousness or anxiety.   Discomfort between the shoulder blades (scapula) or upper back.   Tingling in the hands and arms.   In elderly people (regardless of gender), MI symptoms can be subtle, such as:   Sweating (diaphoresis).   Shortness of breath (dyspnea).   General tiredness (fatigue) or not feeling well (malaise).  DIAGNOSIS  Diagnosis of an MI involves several tests such as:  An assessment of your vital signs such as heart rhythm, blood pressure, respiratory rate, and oxygen level.   An EKG (ECG) to look at the electrical activity of your heart.   Blood tests called cardiac markers are drawn at scheduled times to measure proteins or enzymes released by the damaged heart muscle.   A chest   X-ray.   An echocardiogram to evaluate heart motion and blood flow.   Coronary angiography (cardiac catheterization). This is a diagnostic procedure to look at the heart arteries.  TREATMENT  Acute Intervention. For an MI, the national standard in the [place] is to have an acute intervention in under 90 minutes from the time you get to the hospital. An acute intervention is a special procedure to open up the heart arteries. It is done in a treatment room called a "catheterization lab" (cath lab). Some hospitals do no have a cath lab. If you are having an MI and the hospital does not have a cath lab, the  standard is to transport you to a hospital that has one. In the cath lab, acute intervention includes:  Angioplasty. An angioplasty involves inserting a thin, flexible tube (catheter) into an artery in either your groin or wrist. The catheter is threaded to the heart arteries. A balloon at the end of the catheter is inflated to open a narrowed or blocked heart artery. During an angioplasty procedure, a small mesh tube (stent) may be used to keep the heart artery open. Depending on your condition and health history, one of two types of stents may be placed:   Drug-eluting stent (DES). A DES is coated with a medicine to prevent scar tissue from growing over the stent. With drug-eluting stents, blood thinning medicine will need to be taken for up to a year.   Bare metal stent. This type of stent has no special coating to keep tissue from growing over it. This type of stent is used if you cannot take blood thinning medicine for a prolonged time or you need surgery in the near future. After a bare metal stent is placed, blood thinning medicine will need to be taken for about a month.   If you are taking blood thinning medicine (anti-platelet therapy) after stent placement, do not stop taking it unless your caregiver says it is okay to do so. Make sure you understand how long you need to take the medicine.  Surgical Intervention  If an acute intervention is not successful, surgery may be needed:   Open heart surgery (coronary artery bypass graft, CABG). CABG takes a vein (saphenous vein) from your leg. The vein is then attached to the blocked heart artery which bypasses the blockage. This then allows blood flow to the heart muscle.  Additional Interventions  A "clot buster" medicine (thrombolytic) may be given. This medicine can help break up a clot in the heart artery. This medicine may be given if a person cannot get to a cath lab right away.   Intra-aortic balloon pump (IABP). If you have suffered a  very severe MI and are too unstable to go to the cath lab or to surgery, an IABP may be used. This is a temporary mechanical device used to increase blood flow to the heart and reduce the workload of the heart until you are stable enough to go to the cath lab or surgery.  HOME CARE INSTRUCTIONS After an MI, you may need the following:  Medication. Take medication as directed by your caregiver. Medications after an MI may:   Keep your blood from clotting easily (blood thinners).   Control your blood pressure.   Help lower your cholesterol.   Control abnormal heart rhythms.   Lifestyle changes. Under the guidance of your caregiver, lifestyle changes include:   Quitting smoking, if you smoke. Your caregiver can help you quit.   Being   physically active.   Maintaining a healthy weight.   Eating a heart healthy diet. A dietician can help you learn healthy eating options.   Managing diabetes.   Reducing stress.   Limiting alcohol intake.  SEEK IMMEDIATE MEDICAL CARE IF:   You have severe chest pain, especially if the pain is crushing or pressure-like and spreads to the arms, back, neck, or jaw. This is an emergency. Do not wait to see if the pain will go away. Get medical help at once. Call your local emergency services (911 in the [place] Do not drive yourself to the hospital.   You have shortness of breath during rest, sleep, or with activity.   You have sudden sweating or clammy skin.   You feel sick to your stomach (nauseous) and throw up (vomit).   You suddenly become lightheaded or dizzy.   You feel your heart beating rapidly or you notice "skipped" beats.  MAKE SURE YOU:   Understand these instructions.   Will watch your condition.   Will get help right away if you are not doing well or get worse.  Document Released: 09/25/2005 Document Revised: 06/07/2011 Document Reviewed: 02/22/2011 [employer] Patient Information 2012 [employer]. 

## 2011-12-06 NOTE — Discharge Summary (Signed)
Elio Haden C. Lashana Spang, MD, FACC Attending Cardiologist [facility]  

## 2011-12-06 NOTE — Discharge Summary (Signed)
Patient ID: Gregory Blankenship,  MRN: [id number], DOB/AGE: [birth year] 71 y.o.  Admit date: 12/02/2011 Discharge date: 12/06/2011  Primary Care Provider: Dr Nehemiah Settle Primary Cardiologist: Dr Allyson Sabal  Discharge Diagnoses  Principal Problem:  *STEMI (ST elevation myocardial infarction)  Active Problems:  Pulmonary embolism, 9/11 after prostate surgery  Chronic anticoagulation  Leukocytosis  CAD, chronically occluded LAD at cath, (unable to place stent)  Cardiomyopathy, ischemic, EF 35-40% by 2D 2/26  Hypothyroid, treated    Procedures: Urgent cath 12/02/11   Hospital Course  The patient is a 71 year old male followed by Dr. Renford Dills. He has no prior history of coronary disease. He has a history of prostate cancer. He underwent robotic prostatectomy in September of 2011. Shortly thereafter he developed fairly extensive bilateral pulmonary embolism. He's been placed on Coumadin and has been on Coumadin since. He presented to the emergency room in [facility] 12/02/11 with chest pain. He's actually been having chest pain off and on for 10 days. He saw his primary care doctor and and EKG looked okay. He denies any shortness of breath nausea vomiting or diaphoresis. He has had some radiation to his arms and up into his jaw and ears. At [facility] emergency room his EKG showed anterior ST elevation and poor anterior R-wave progression. A code STEMI was called. The patient was transferred urgently to the cath lab at [place]. Catheterization by Dr Allyson Sabal showed chronically occluded LAD that he was unable to open. The patient's Troponin peaked at 8.37. 2D echo prior to discharge showed an EF of 35-40%. There was no evicence for mural thrombus. It was felt he would not require Life vest. His medications were adjusted as his B/P allowed and we feel he can be discharged 12/06/11.   Discharge Vitals:  Blood pressure 99/62, pulse 74, temperature 97.5 F (36.4 C), temperature source Oral, resp. rate 18,  height 6' (1.829 m), weight 88.9 kg (195 lb 15.8 oz), SpO2 97.00%.    Labs: Results for orders placed during the hospital encounter of 12/02/11 (from the past 48 hour(s))  CARDIAC PANEL(CRET KIN+CKTOT+MB+TROPI)     Status: Abnormal   Collection Time   12/04/11  5:14 PM      Component Value Range Comment   Total CK 319 (*) 7 - 232 (U/L)    CK, MB 14.9 (*) 0.3 - 4.0 (ng/mL) CRITICAL VALUE NOTED.  VALUE IS CONSISTENT WITH PREVIOUSLY REPORTED AND CALLED VALUE.   Troponin I 7.05 (*) <0.30 (ng/mL)    Relative Index 4.7 (*) 0.0 - 2.5    CARDIAC PANEL(CRET KIN+CKTOT+MB+TROPI)     Status: Abnormal   Collection Time   12/05/11 12:27 AM      Component Value Range Comment   Total CK 220  7 - 232 (U/L)    CK, MB 9.4 (*) 0.3 - 4.0 (ng/mL) CRITICAL VALUE NOTED.  VALUE IS CONSISTENT WITH PREVIOUSLY REPORTED AND CALLED VALUE.   Troponin I 5.77 (*) <0.30 (ng/mL)    Relative Index 4.3 (*) 0.0 - 2.5    PROTIME-INR     Status: Abnormal   Collection Time   12/05/11  5:25 AM      Component Value Range Comment   Prothrombin Time 25.9 (*) 11.6 - 15.2 (seconds)    INR 2.32 (*) 0.00 - 1.49    PROTIME-INR     Status: Abnormal   Collection Time   12/06/11  5:45 AM      Component Value Range Comment   Prothrombin Time 28.2 (*)  11.6 - 15.2 (seconds)    INR 2.59 (*) 0.00 - 1.49    URINALYSIS, ROUTINE W REFLEX MICROSCOPIC     Status: Normal   Collection Time   12/06/11 11:04 AM      Component Value Range Comment   Color, Urine YELLOW  YELLOW     APPearance CLEAR  CLEAR     Specific Gravity, Urine 1.011  1.005 - 1.030     pH 6.0  5.0 - 8.0     Glucose, UA NEGATIVE  NEGATIVE (mg/dL)    Hgb urine dipstick NEGATIVE  NEGATIVE     Bilirubin Urine NEGATIVE  NEGATIVE     Ketones, ur NEGATIVE  NEGATIVE (mg/dL)    Protein, ur NEGATIVE  NEGATIVE (mg/dL)    Urobilinogen, UA 1.0  0.0 - 1.0 (mg/dL)    Nitrite NEGATIVE  NEGATIVE     Leukocytes, UA NEGATIVE  NEGATIVE  MICROSCOPIC NOT DONE ON URINES WITH NEGATIVE PROTEIN,  BLOOD, LEUKOCYTES, NITRITE, OR GLUCOSE <1000 mg/dL.  BASIC METABOLIC PANEL     Status: Abnormal   Collection Time   12/06/11 11:25 AM      Component Value Range Comment   Sodium 136  135 - 145 (mEq/L)    Potassium 4.6  3.5 - 5.1 (mEq/L)    Chloride 100  96 - 112 (mEq/L)    CO2 29  19 - 32 (mEq/L)    Glucose, Bld 88  70 - 99 (mg/dL)    BUN 20  6 - 23 (mg/dL)    Creatinine, Ser 1.61  0.50 - 1.35 (mg/dL)    Calcium 9.6  8.4 - 10.5 (mg/dL)    GFR calc non Af Amer 62 (*) >90 (mL/min)    GFR calc Af Amer 72 (*) >90 (mL/min)   PRO B NATRIURETIC PEPTIDE     Status: Abnormal   Collection Time   12/06/11 11:25 AM      Component Value Range Comment   Pro B Natriuretic peptide (BNP) 1580.0 (*) 0 - 125 (pg/mL)     Disposition: Discharged home in stable condition.   Discharge Medications:  Medication List  As of 12/06/2011  3:10 PM   TAKE these medications         acetaminophen 325 MG tablet   Commonly known as: TYLENOL   Take 2 tablets (650 mg total) by mouth every 4 (four) hours as needed.      aspirin 81 MG chewable tablet   Chew 1 tablet (81 mg total) by mouth daily.      atorvastatin 20 MG tablet   Commonly known as: LIPITOR   Take 1 tablet (20 mg total) by mouth daily at 6 PM.      carvedilol 3.125 MG tablet   Commonly known as: COREG   Take 1 tablet (3.125 mg total) by mouth 2 (two) times daily with a meal.      isosorbide mononitrate 15 mg Tb24   Commonly known as: IMDUR   Take 0.5 tablets (15 mg total) by mouth daily.      levothyroxine 88 MCG tablet   Commonly known as: SYNTHROID, LEVOTHROID   Take 88 mcg by mouth daily.      lisinopril 5 MG tablet   Commonly known as: PRINIVIL,ZESTRIL   Take 1 tablet (5 mg total) by mouth daily.      nitroGLYCERIN 0.4 MG SL tablet   Commonly known as: NITROSTAT   Place 1 tablet (0.4 mg total) under the tongue every 5 (five)  minutes as needed for chest pain.      simvastatin 20 MG tablet   Commonly known as: ZOCOR   Take 20 mg  by mouth every evening.      warfarin 5 MG tablet   Commonly known as: COUMADIN   Take 5 mg by mouth daily.            Outstanding Labs/Studies  Duration of Discharge Encounter: Greater than 30 minutes including physician time.  Jolene Provost PA-C 12/06/2011 3:10 PM

## 2011-12-08 MED FILL — Perflutren Lipid Microsphere IV Susp 6.52 MG/ML: INTRAVENOUS | Qty: 2 | Status: AC

## 2011-12-11 ENCOUNTER — Telehealth: Payer: Self-pay | Admitting: Oncology

## 2011-12-11 NOTE — Telephone Encounter (Signed)
Pt's wife called today to r/s pt's new pt appt. Pt missed appt because he was in the hosp. Wife given new appt for 3/6 @ 10:30 am.

## 2011-12-12 ENCOUNTER — Other Ambulatory Visit: Payer: Self-pay | Admitting: Oncology

## 2011-12-12 ENCOUNTER — Telehealth: Payer: Self-pay | Admitting: *Deleted

## 2011-12-12 DIAGNOSIS — D751 Secondary polycythemia: Secondary | ICD-10-CM

## 2011-12-12 NOTE — Telephone Encounter (Signed)
Spoke with patient, reminded him of new patient appt tomorrow @ 10:30 and to bring his medication list. Gave directions to building.

## 2011-12-13 ENCOUNTER — Other Ambulatory Visit (HOSPITAL_BASED_OUTPATIENT_CLINIC_OR_DEPARTMENT_OTHER): Payer: MEDICARE | Admitting: Lab

## 2011-12-13 ENCOUNTER — Telehealth: Payer: Self-pay | Admitting: Oncology

## 2011-12-13 ENCOUNTER — Ambulatory Visit (HOSPITAL_BASED_OUTPATIENT_CLINIC_OR_DEPARTMENT_OTHER): Payer: MEDICARE | Admitting: Oncology

## 2011-12-13 ENCOUNTER — Ambulatory Visit: Payer: MEDICARE

## 2011-12-13 DIAGNOSIS — D72829 Elevated white blood cell count, unspecified: Secondary | ICD-10-CM

## 2011-12-13 DIAGNOSIS — I2699 Other pulmonary embolism without acute cor pulmonale: Secondary | ICD-10-CM

## 2011-12-13 DIAGNOSIS — D45 Polycythemia vera: Secondary | ICD-10-CM

## 2011-12-13 DIAGNOSIS — D473 Essential (hemorrhagic) thrombocythemia: Secondary | ICD-10-CM

## 2011-12-13 DIAGNOSIS — C61 Malignant neoplasm of prostate: Secondary | ICD-10-CM

## 2011-12-13 DIAGNOSIS — D751 Secondary polycythemia: Secondary | ICD-10-CM

## 2011-12-13 DIAGNOSIS — Z8546 Personal history of malignant neoplasm of prostate: Secondary | ICD-10-CM

## 2011-12-13 LAB — CBC WITH DIFFERENTIAL/PLATELET
Basophils Absolute: 0.1 10*3/uL (ref 0.0–0.1)
Eosinophils Absolute: 0.4 10*3/uL (ref 0.0–0.5)
HGB: 18.1 g/dL — ABNORMAL HIGH (ref 13.0–17.1)
LYMPH%: 11 % — ABNORMAL LOW (ref 14.0–49.0)
MCV: 81.5 fL (ref 79.3–98.0)
MONO#: 1.5 10*3/uL — ABNORMAL HIGH (ref 0.1–0.9)
MONO%: 10.6 % (ref 0.0–14.0)
NEUT#: 10.6 10*3/uL — ABNORMAL HIGH (ref 1.5–6.5)
Platelets: 454 10*3/uL — ABNORMAL HIGH (ref 140–400)
RBC: 6.92 10*6/uL — ABNORMAL HIGH (ref 4.20–5.82)
RDW: 15.8 % — ABNORMAL HIGH (ref 11.0–14.6)
WBC: 14.2 10*3/uL — ABNORMAL HIGH (ref 4.0–10.3)

## 2011-12-13 LAB — COMPREHENSIVE METABOLIC PANEL
Albumin: 4.1 g/dL (ref 3.5–5.2)
BUN: 23 mg/dL (ref 6–23)
CO2: 29 mEq/L (ref 19–32)
Glucose, Bld: 45 mg/dL — ABNORMAL LOW (ref 70–99)
Potassium: 5.1 mEq/L (ref 3.5–5.3)
Sodium: 139 mEq/L (ref 135–145)
Total Protein: 6.9 g/dL (ref 6.0–8.3)

## 2011-12-13 NOTE — Progress Notes (Signed)
CC:   Deirdre Peer. Polite, M.D. Nanetta Batty, M.D. Heloise Purpura, MD  REFERRING PHYSICIAN:  Deirdre Peer. Polite, M.D.  REASON FOR CONSULTATION:  Erythrocytosis.  HISTORY OF PRESENT ILLNESS:  Mr. Moder is a pleasant 71 year old gentleman, a native of [place].  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  As mentioned, he is currently retired and has been living in [place] for over 20 years. He is a gentleman with a significant past medical history for varicose veins.  He also was diagnosed with clinically localized adenocarcinoma of the prostate back in September 2011.  He had a prostatectomy that was done on June 20, 2010 under the care of Dr. Heloise Purpura and the pathology revealed a prostate adenocarcinoma Gleason score 3+4 equals 7, without any evidence of any angiolymphatic invasion.  He was a stage T2c.  Unfortunately on 06/29/2010 he presented with an extensive bilateral pulmonary embolus and he was started on anticoagulation with Lovenox and subsequently Coumadin and had been on Coumadin since. During that hospitalization, it was noted that his white cell count was elevated around 20,000 and felt to be possibly stress reaction and has ranged about between 20 to 22,000.  His hemoglobin was normal all throughout this hospitalization, so his platelets.  The patient remained on Coumadin all the time, and then presented to Dr. Idelle Crouch office back on November 20, 2011, with complaints of chest pain, possible dyspepsia. He had a CBC at that time that revealed a hemoglobin of 17.9, white cell count 17.1.  His platelet counts were normal at 371. Four  days later, his CBC was repeated and showed his white cell count was 16.3, his hemoglobin was 18 and platelet count of 414.  The patient did not really have any benefit with antacids and proton pump inhibitors, and presented on 12/02/2011 with complaints of chest pain.  She had an EKG showed  an anterior ST-elevation MI at that time.  The patient was hospitalized in between 02/23 and 12/06/2011.  During his hospitalization, he had a cardiac catheterization on 02/23, which showed chronically occluded LAD which was unable to open. He had a troponin peak of 8.37, his EF about 35% to 40%.  The patient was discharged home on both Coumadin and aspirin, and during his hospitalization, his hemoglobin was as high as 20, his white cell count was as high as 26, and platelet count 426.  He presents today for evaluation regarding his polycythemia and leukocytosis.  Clinically, Mr. Czajkowski feels a lot better.  He is no longer reporting chest pain, not reporting any shortness of breath.  He has resumed most activities of daily living.  He has not had recent infection and not had any vascular headaches.  Had not had any bleeding problems or diathesis.  REVIEW OF SYSTEMS:  Did not report any headaches, blurred vision, double vision. Did not report any motor or sensory neuropathy, did ont report any alteration in mental status. Did not report any psychiatric issues, depression. Did not report any fever, chills, sweats.  Did not report any cough, hemoptysis, hematemesis.  No nausea, vomiting.  No abdominal pain.  No hematochezia, melena, genitourinary complaints.  No frequency, urgency, hesitancy.  No musculoskeletal complaints.  Rest of review of systems is unremarkable.  PAST MEDICAL HISTORY:  Significant for prostate cancer.  He has history of varicose veins, history of hypothyroidism and hyperlipidemia, and his most recent history of pulmonary embolus and coronary artery disease.  MEDICATION:  He is on acetaminophen, aspirin, carvedilol, isosorbide mononitrate, levothyroxine, lisinopril, nitroglycerin, simvastatin and warfarin.  ALLERGIES:  To erythromycin.  SOCIAL HISTORY:  He is married.  He has 2 children.  Denied any really alcohol or tobacco abuse.  He is currently retired at this  time.  FAMILY HISTORY:  His father died of complications of lung cancer. Mother had breast cancer.  He had 1 brother that had what appears to be a salivary gland malignancy.  PHYSICAL EXAMINATION:  Alert, awake gentleman appeared really no active distress today.  Vitals:  Blood pressure 90/53, pulse 52, respiration 20, temperature is 96.7.  Head:  Normocephalic, atraumatic.  Pupils equal, round, reactive to light.  Oral mucosa moist and pink.  Neck: Supple without adenopathy.  Heart:  Regular rate and rhythm.  S1, S2. Lungs:  Clear to auscultation.  No rhonchi, wheeze, no dullness to percussion.  Abdomen:  Soft, nontender.  No hepatosplenomegaly. Extremities:  No clubbing, cyanosis, or edema.  Neurologic:  Intact motor, sensory and deep tendon reflexes.  LABORATORY DATA:  Today showed a hemoglobin of 18, white cell count of 14.2, platelet count of 454.  ASSESSMENT AND PLAN:  This is a pleasant 71 year old gentleman with the following issues: Erythrocytosis associated with leukocytosis and thrombocytosis.  I had a lengthy discussion today with Mr. Kiehn and his wife discussing the differential diagnosis at this time.  I have talked to him about secondary causes that cause elevation in his counts today.  Causes such as acute inflammatory conditions which certainly applies here.  He had a myocardial infarction, as well as a recent elevation in his white cells in conjunction with his recent prostate cancer surgery and pulmonary embolus, so his elevation of his counts corresponded to an acute event, an acute inflammatory condition that have caused elevation not only in his red cells, but also in his white cells and his platelet counts. Other conditions that can cause polycythemia such as chronic hypoxic state, toxic exposure to chemicals, high air elevation are also discussed today. However I think are unlikely as they do not pertain to his history.  Primary causes such as polycythemia  vera are definitely a consideration given his elevation not only in the red cells elevation, the white cells and platelets, might indicate a myeloproliferative disorder such as polycythemia vera. One can wonder that his recent pulmonary embolus and his myocardial infarction could be related to may be higher risk of thrombosis related to polycythemia. I think that is of a less likely possibility.  I believe his pulmonary embolus might be related to his recent prostate cancer surgery rather than an actual myeloproliferative disorder causing that thrombosis.  However, it is also a consideration.  I do not think his recent myocardial infarction is related to a possible myeloproliferative disorder either. He has been chronically anticoagulated on Coumadin so I think it is a less likely scenario.  However to work up whether he has a polycythemia vera, I am going to obtain a JAK2 mutation to rule that out.  Other possibilities also include an erythropoietin secreting tumor.  He already had a chest x-ray, so I do not think that really necessarily needs to be repeated. His most recent x-ray was in February 2013 and that was negative. However,  a kidney tumor can certainly be producing erythropoietin and for that reason, I think an abdominal imaging with CT scan with contrast will certainly help rule that out.  From a management standpoint, I will repeat his blood counts in about  4 weeks and if his white cell count and red cells continued to decline, like they are already now, and his JAK2 mutation is negative, then I think is probably secondary causes and no further intervention is needed.  However if his JAK2 mutation is positive and he has persistent elevation in his hemoglobin then we did talk about therapeutic phlebotomy at this point, to decrease his risk of thrombotic risk.  At this time, risk of thrombosis is certainly low given the fact that he is already on Coumadin and aspirin.  After  he completes the CT imaging and repeat the blood count in 1 month, he will return and I will give my final recommendation at that time.    ______________________________ Benjiman Core, M.D. FNS/MEDQ  D:  12/13/2011  T:  12/13/2011  Job:  [id number]

## 2011-12-13 NOTE — Telephone Encounter (Signed)
appts made and printed for pt aom °

## 2011-12-13 NOTE — Progress Notes (Signed)
Note dictated

## 2011-12-19 LAB — JAK2 GENOTYPR

## 2011-12-27 ENCOUNTER — Ambulatory Visit (HOSPITAL_COMMUNITY)
Admission: RE | Admit: 2011-12-27 | Discharge: 2011-12-27 | Disposition: A | Discharge status: Home / Self Care | Payer: MEDICARE | Source: Ambulatory Visit | Attending: Oncology | Admitting: Oncology

## 2011-12-27 DIAGNOSIS — I2699 Other pulmonary embolism without acute cor pulmonale: Secondary | ICD-10-CM

## 2011-12-27 DIAGNOSIS — Z9079 Acquired absence of other genital organ(s): Secondary | ICD-10-CM | POA: Insufficient documentation

## 2011-12-27 DIAGNOSIS — C61 Malignant neoplasm of prostate: Secondary | ICD-10-CM | POA: Insufficient documentation

## 2011-12-27 DIAGNOSIS — Z86711 Personal history of pulmonary embolism: Secondary | ICD-10-CM | POA: Insufficient documentation

## 2011-12-27 MED ORDER — IOHEXOL 300 MG/ML  SOLN
100.0000 mL | Freq: Once | INTRAMUSCULAR | Status: AC | PRN
  Administered 2011-12-27: 100 mL via INTRAVENOUS

## 2011-12-28 ENCOUNTER — Encounter (HOSPITAL_COMMUNITY)
Admission: RE | Admit: 2011-12-28 | Discharge: 2011-12-28 | Disposition: A | Discharge status: Home / Self Care | Payer: Medicare Other | Source: Ambulatory Visit | Attending: Cardiovascular Disease | Admitting: Cardiovascular Disease

## 2011-12-28 ENCOUNTER — Encounter (HOSPITAL_COMMUNITY): Payer: Self-pay

## 2011-12-28 DIAGNOSIS — I2510 Atherosclerotic heart disease of native coronary artery without angina pectoris: Secondary | ICD-10-CM | POA: Insufficient documentation

## 2011-12-28 DIAGNOSIS — Z87891 Personal history of nicotine dependence: Secondary | ICD-10-CM | POA: Insufficient documentation

## 2011-12-28 DIAGNOSIS — I2109 ST elevation (STEMI) myocardial infarction involving other coronary artery of anterior wall: Secondary | ICD-10-CM | POA: Insufficient documentation

## 2011-12-28 DIAGNOSIS — Z9861 Coronary angioplasty status: Secondary | ICD-10-CM | POA: Insufficient documentation

## 2011-12-28 DIAGNOSIS — I2582 Chronic total occlusion of coronary artery: Secondary | ICD-10-CM | POA: Insufficient documentation

## 2011-12-28 DIAGNOSIS — I2589 Other forms of chronic ischemic heart disease: Secondary | ICD-10-CM | POA: Insufficient documentation

## 2011-12-28 DIAGNOSIS — I2782 Chronic pulmonary embolism: Secondary | ICD-10-CM | POA: Insufficient documentation

## 2011-12-28 DIAGNOSIS — E039 Hypothyroidism, unspecified: Secondary | ICD-10-CM | POA: Insufficient documentation

## 2011-12-28 DIAGNOSIS — Z5189 Encounter for other specified aftercare: Secondary | ICD-10-CM | POA: Insufficient documentation

## 2011-12-28 DIAGNOSIS — Z7901 Long term (current) use of anticoagulants: Secondary | ICD-10-CM | POA: Insufficient documentation

## 2011-12-28 MED FILL — Perflutren Lipid Microsphere IV Susp 6.52 MG/ML: INTRAVENOUS | Qty: 2 | Status: AC

## 2011-12-28 NOTE — Progress Notes (Signed)
Cardiac Rehab Medication Review by a Pharmacist  Does the patient  feel that his/her medications are working for him/her?  yes  Has the patient been experiencing any side effects to the medications prescribed?  No -but had some dizziness when walking after dose increase of coreg with the evening dose for one day, has improved.   Does the patient measure his/her own blood pressure or blood glucose at home?  no   Does the patient have any problems obtaining medications due to transportation or finances?   no  Understanding of regimen: good Understanding of indications: good Potential of compliance: good -uses a bag system for AM and PM meds and a list of medications on the bathroom mirror    Pharmacist comments: Patient asked about caffeine intake, recommended decaffeinated beverages or no caffeine intake.     Concha Norway 12/28/2011 8:33 AM

## 2012-01-01 ENCOUNTER — Encounter (HOSPITAL_COMMUNITY): Payer: Self-pay

## 2012-01-01 ENCOUNTER — Encounter (HOSPITAL_COMMUNITY)
Admission: RE | Admit: 2012-01-01 | Discharge: 2012-01-01 | Disposition: A | Discharge status: Home / Self Care | Payer: Medicare Other | Source: Ambulatory Visit | Attending: Cardiovascular Disease | Admitting: Cardiovascular Disease

## 2012-01-01 NOTE — Progress Notes (Signed)
Pt started cardiac rehab today.  Pt tolerated light exercise without difficulty.  VSS. Telemetry- NSR, occasional PVC, sinus arrhythmia with exercise.  Sinus bradycardia rate 49 during cool down. Rhythm strips faxed to Dr. Allyson Sabal for review.  Pt asymptomatic.  Psychosocial health assessment reviewed with pt, pt reports health history significant for pulmonary embolism resulting in chronic coumadin therapy.  This was identified as a significant life changing event in addition to his recent MI.  Pt states he is coping well with both illnesses.  Pt oriented to exercise equipment and routine.  Understanding verbalized.  Will continue to monitor the patient throughout  the program.

## 2012-01-02 ENCOUNTER — Telehealth: Payer: Self-pay | Admitting: *Deleted

## 2012-01-02 NOTE — Telephone Encounter (Signed)
Patient called, wanting results from recent ct scan and lab work. Per dr Clelia Croft, ct scan is normal and we are still waiting for all of the lab work to come back, patient has an appointment the  first week in April and we should have all results back to discuss with patient in detail. Patient verbalizes understanding.

## 2012-01-03 ENCOUNTER — Encounter (HOSPITAL_COMMUNITY)
Admission: RE | Admit: 2012-01-03 | Discharge: 2012-01-03 | Disposition: A | Discharge status: Home / Self Care | Payer: Medicare Other | Source: Ambulatory Visit | Attending: Cardiovascular Disease | Admitting: Cardiovascular Disease

## 2012-01-03 NOTE — Progress Notes (Signed)
Gregory Blankenship 71 y.o. male       Nutrition Screen                                                                    YES  NO Do you live in a nursing home?  X   Do you eat out more than 3 times/week?    X If yes, how many times per week do you eat out?  Do you have food allergies?   X If yes, what are you allergic to?  Have you gained or lost more than 10 lbs without trying?               X If yes, how much weight have you lost and over what time period?  lbs gained or lost over  weeks/month  Do you want to lose weight?     X If yes, what is a goal weight or amount of weight you would like to lose?  lb  Do you eat alone most of the time?   X   Do you eat less than 2 meals/day?  X If yes, how many meals do you eat?  Do you drink more than 3 alcohol drinks/day?  X If yes, how many drinks per day?  Are you having trouble with constipation? *  X If yes, what are you doing to help relieve constipation?  Do you have financial difficulties with buying food?*    X   Are you experiencing regular nausea/ vomiting?*     X   Do you have a poor appetite? *                                        X   Do you have trouble chewing/swallowing? *   X    Pt with diagnoses of:  X GERD          X %  Body fat >goal / Body Mass Index >25 X MI       Pt Risk Score    0       Diagnosis Risk Score   15       Total Risk Score  15                         High Risk               X Low Risk    HT: 73.3" Ht Readings from Last 1 Encounters:  12/28/11 6' 1.3" (1.862 m)    WT:   195.4 lb (88.8 kg) Wt Readings from Last 3 Encounters:  12/28/11 195 lb 12.3 oz (88.8 kg)  12/13/11 196 lb (88.905 kg)  12/02/11 195 lb 15.8 oz (88.9 kg)     IBW 84.5 105%IBW BMI 25.6 25.6%body fat  Meds reviewed: Coumadin  Past Medical History  Diagnosis Date  . Hypothyroid   . Pulmonary embolism   Activity level: Pt is moderately active  Wt goal: 195 lb ( 88.8 kg) Current tobacco use? No      Food/Drug Interaction? Yes If Y,  which med(s)? Coumadin If yes, pt  given Food/Drug Interaction handout? Yes Labs:  Lipid Panel     Component Value Date/Time   CHOL 140 12/03/2011 0155   TRIG 102 12/03/2011 0155   HDL 36* 12/03/2011 0155   CHOLHDL 3.9 12/03/2011 0155   VLDL 20 12/03/2011 0155   LDLCALC 84 12/03/2011 0155   Lab Results  Component Value Date   HGBA1C 5.4 12/02/2011   12/13/11 Glucose 45  LDL goal: < 70      MI and > 2:      family h/o, > 69 yo male Estimated Daily Nutrition Needs for: ? wt maintenance 2400-2750 Kcal , Total Fat 80-90gm, Saturated Fat 18-21 gm, Trans Fat 2.6-3.1 gm,  Sodium less than 1500 mg

## 2012-01-03 NOTE — Progress Notes (Signed)
Pt had significant bradycardia on monitor during cool down at cardiac rehab today.  Pt hr momentarily in the 30's,  longest r-r  Interval 2.2 seconds.  Pt asymptomatic, VSS.  Pt usual resting hr- 49-50.  Dr. Hazle Coca office made aware, strips faxed for Dr. Allyson Sabal to review.  Pt advised, understanding verbalized-jrion,rn

## 2012-01-05 ENCOUNTER — Encounter (HOSPITAL_COMMUNITY)
Admission: RE | Admit: 2012-01-05 | Discharge: 2012-01-05 | Disposition: A | Discharge status: Home / Self Care | Payer: Medicare Other | Source: Ambulatory Visit | Attending: Cardiovascular Disease | Admitting: Cardiovascular Disease

## 2012-01-08 ENCOUNTER — Encounter (HOSPITAL_COMMUNITY)
Admission: RE | Admit: 2012-01-08 | Discharge: 2012-01-08 | Disposition: A | Discharge status: Home / Self Care | Payer: Medicare Other | Source: Ambulatory Visit | Attending: Cardiovascular Disease | Admitting: Cardiovascular Disease

## 2012-01-08 DIAGNOSIS — Z9861 Coronary angioplasty status: Secondary | ICD-10-CM | POA: Insufficient documentation

## 2012-01-08 DIAGNOSIS — Z5189 Encounter for other specified aftercare: Secondary | ICD-10-CM | POA: Insufficient documentation

## 2012-01-08 DIAGNOSIS — I2109 ST elevation (STEMI) myocardial infarction involving other coronary artery of anterior wall: Secondary | ICD-10-CM | POA: Insufficient documentation

## 2012-01-08 DIAGNOSIS — Z87891 Personal history of nicotine dependence: Secondary | ICD-10-CM | POA: Insufficient documentation

## 2012-01-08 DIAGNOSIS — Z7901 Long term (current) use of anticoagulants: Secondary | ICD-10-CM | POA: Insufficient documentation

## 2012-01-08 DIAGNOSIS — I2589 Other forms of chronic ischemic heart disease: Secondary | ICD-10-CM | POA: Insufficient documentation

## 2012-01-08 DIAGNOSIS — I2782 Chronic pulmonary embolism: Secondary | ICD-10-CM | POA: Insufficient documentation

## 2012-01-08 DIAGNOSIS — E039 Hypothyroidism, unspecified: Secondary | ICD-10-CM | POA: Insufficient documentation

## 2012-01-08 DIAGNOSIS — I2510 Atherosclerotic heart disease of native coronary artery without angina pectoris: Secondary | ICD-10-CM | POA: Insufficient documentation

## 2012-01-08 DIAGNOSIS — I2582 Chronic total occlusion of coronary artery: Secondary | ICD-10-CM | POA: Insufficient documentation

## 2012-01-10 ENCOUNTER — Encounter (HOSPITAL_COMMUNITY)
Admission: RE | Admit: 2012-01-10 | Discharge: 2012-01-10 | Disposition: A | Discharge status: Home / Self Care | Payer: Medicare Other | Source: Ambulatory Visit | Attending: Cardiovascular Disease | Admitting: Cardiovascular Disease

## 2012-01-12 ENCOUNTER — Encounter (HOSPITAL_COMMUNITY)
Admission: RE | Admit: 2012-01-12 | Discharge: 2012-01-12 | Disposition: A | Discharge status: Home / Self Care | Payer: Medicare Other | Source: Ambulatory Visit | Attending: Cardiovascular Disease | Admitting: Cardiovascular Disease

## 2012-01-12 NOTE — Progress Notes (Signed)
Reviewed home exercise with pt today.  Pt plans to walk and use treadmill for exercise.  Reviewed THR, pulse, RPE, sign and symptoms, NTG use, and when to call 911 or MD.  Pt voiced understanding. Electronically signed by Harriett Sine MS on Friday January 12 2012 at [phone]

## 2012-01-15 ENCOUNTER — Encounter (HOSPITAL_COMMUNITY)
Admission: RE | Admit: 2012-01-15 | Discharge: 2012-01-15 | Disposition: A | Discharge status: Home / Self Care | Payer: Medicare Other | Source: Ambulatory Visit | Attending: Cardiovascular Disease | Admitting: Cardiovascular Disease

## 2012-01-15 NOTE — Progress Notes (Signed)
Gregory Blankenship 71 y.o. male Nutrition Note  Spoke with pt.  Nutrition Plan and Nutrition Survey reviewed with pt. Pt is following Step 2 of the Therapeutic Lifestyle Changes diet. This Pt would like to lose down to his IBW. Weight loss tips reviewed. Pt is on Coumadin. Pt able to name foods high in vitamin K. Pt has been avoiding leafy greens for the past 1.5 yrs. Consistent vitamin K intake discussed.  Nutrition Diagnosis   Overweight related to excessive energy intake as evidenced by a BMI of 25.6 Re-Estimated Daily Nutrition Needs for: wt loss  1600-2100 Kcal, 40-55 gm fat, 12-16 gm sat fat, 1.5-2.1 gm trans-fat, <1500 mg sodium  Nutrition Intervention   Pt's individual nutrition plan including cholesterol goals reviewed with pt.   Benefits of adopting Therapeutic Lifestyle Changes discussed when Medficts reviewed.   Pt to attend the Portion Distortion class   Pt to attend the  ? Nutrition I class                         ? Nutrition II class    Pt given handouts for: ? wt loss ? Consistent vit K diet   Continue client-centered nutrition education by RD, as part of interdisciplinary care. Goal(s)   Pt to identify food quantities necessary to achieve: ? wt loss to a goal wt of 186 lb (84.5 kg) at graduation from cardiac rehab.  Monitor and Evaluate progress toward nutrition goal with team.

## 2012-01-16 ENCOUNTER — Telehealth: Payer: Self-pay | Admitting: Oncology

## 2012-01-16 ENCOUNTER — Other Ambulatory Visit (HOSPITAL_BASED_OUTPATIENT_CLINIC_OR_DEPARTMENT_OTHER): Payer: Medicare Other

## 2012-01-16 ENCOUNTER — Ambulatory Visit (HOSPITAL_BASED_OUTPATIENT_CLINIC_OR_DEPARTMENT_OTHER): Payer: Medicare Other | Admitting: Oncology

## 2012-01-16 VITALS — BP 103/62 | HR 49 | Temp 96.6°F | Ht 73.0 in | Wt 198.3 lb

## 2012-01-16 DIAGNOSIS — D45 Polycythemia vera: Secondary | ICD-10-CM

## 2012-01-16 DIAGNOSIS — I2699 Other pulmonary embolism without acute cor pulmonale: Secondary | ICD-10-CM

## 2012-01-16 DIAGNOSIS — Z7901 Long term (current) use of anticoagulants: Secondary | ICD-10-CM

## 2012-01-16 DIAGNOSIS — D751 Secondary polycythemia: Secondary | ICD-10-CM

## 2012-01-16 DIAGNOSIS — C61 Malignant neoplasm of prostate: Secondary | ICD-10-CM

## 2012-01-16 LAB — CBC WITH DIFFERENTIAL/PLATELET
BASO%: 0.7 % (ref 0.0–2.0)
Eosinophils Absolute: 0.4 10*3/uL (ref 0.0–0.5)
MCHC: 33.2 g/dL (ref 32.0–36.0)
MONO#: 1.6 10*3/uL — ABNORMAL HIGH (ref 0.1–0.9)
NEUT#: 6.6 10*3/uL — ABNORMAL HIGH (ref 1.5–6.5)
RBC: 6.52 10*6/uL — ABNORMAL HIGH (ref 4.20–5.82)
RDW: 16.9 % — ABNORMAL HIGH (ref 11.0–14.6)
WBC: 10 10*3/uL (ref 4.0–10.3)
lymph#: 1.4 10*3/uL (ref 0.9–3.3)
nRBC: 0 % (ref 0–0)

## 2012-01-16 LAB — CHCC SMEAR

## 2012-01-16 NOTE — Progress Notes (Signed)
Hematology and Oncology Follow Up Visit  Gregory Blankenship [id number] [birth year] 71 y.o. 01/16/2012 9:10 AM   Principle Diagnosis: 71 year old gentleman with polycythemia diagnosed February of 2013 presented with a hemoglobin of 18. The differential diagnosis is reactive versus primary polycythemia.    Secondary diagnosis: History of prostate cancer diagnosed in 2011. He is also history of coronary arteries disease had a pulmonary embolus.  Interim History: Patient presents today for a followup visit after my initial evaluation back on 12/13/2011. At that time, his hemoglobin was 18.1 with a white cell elevation of 14.2 and a platelet count of 454. The differential diagnosis would include reactive polycythemia related to his recent cardiac event as well as recent hospitalization. His workup so far have been unremarkable including CT scan of the abdomen and pelvis. He did have a JAK 2 positive mutation. Clinically he is feeling a lot better with no recent complications. Is participating in cardiac rehabilitation and had not had any other events. He had not had any bleeding complications he had not had any thrombotic events she is continued to be anticoagulated with Coumadin and aspirin.  Medications: I have reviewed the patient's current medications. Current outpatient prescriptions:acetaminophen (TYLENOL) 325 MG tablet, Take 2 tablets (650 mg total) by mouth every 4 (four) hours as needed., Disp: , Rfl: ;  aspirin 81 MG chewable tablet, Chew 1 tablet (81 mg total) by mouth daily., Disp: , Rfl: ;  carvedilol (COREG) 6.25 MG tablet, Take 6.25 mg by mouth 2 (two) times daily with a meal., Disp: , Rfl: ;  isosorbide mononitrate (IMDUR) 30 MG 24 hr tablet, Take 30 mg by mouth daily., Disp: , Rfl:  levothyroxine (SYNTHROID, LEVOTHROID) 88 MCG tablet, Take 88 mcg by mouth daily., Disp: , Rfl: ;  lisinopril (PRINIVIL,ZESTRIL) 5 MG tablet, Take 1 tablet (5 mg total) by mouth daily., Disp: 30 tablet, Rfl: 5;   nitroGLYCERIN (NITROSTAT) 0.4 MG SL tablet, Place 1 tablet (0.4 mg total) under the tongue every 5 (five) minutes as needed for chest pain., Disp: 25 tablet, Rfl: 2 simvastatin (ZOCOR) 20 MG tablet, Take 20 mg by mouth every evening. Managed by PCP, Dr. Nehemiah Settle, Disp: , Rfl: ;  warfarin (COUMADIN) 5 MG tablet, Take 5 mg by mouth daily. Managed by PCP, Dr. Nehemiah Settle, Disp: , Rfl:   Allergies:  Allergies  Allergen Reactions  . Erythromycin Nausea Only    Past Medical History, Surgical history, Social history, and Family History were reviewed and updated.  Review of Systems: Constitutional:  Negative for fever, chills, night sweats, anorexia, weight loss, pain. Cardiovascular: no chest pain or dyspnea on exertion Respiratory: no cough, shortness of breath, or wheezing Neurological: no TIA or stroke symptoms Dermatological: negative ENT: negative Skin: Negative. Gastrointestinal: no abdominal pain, change in bowel habits, or black or bloody stools Genito-Urinary: no dysuria, trouble voiding, or hematuria Hematological and Lymphatic: negative Breast: negative Musculoskeletal: negative Remaining ROS negative. Physical Exam: Blood pressure 103/62, pulse 49, temperature 96.6 F (35.9 C), temperature source Oral, height 6\' 1"  (1.854 m), weight 198 lb 4.8 oz (89.948 kg). ECOG: 1 General appearance: Alert and oriented and appears in no active distress. Rest of the physical exam was deferred. Head:  Neck: Lymph nodes:  Heart: Lung: Abdomin:  EXT  Lab Results: Lab Results  Component Value Date   WBC 10.0 01/16/2012   HGB 17.9* 01/16/2012   HCT 53.8* 01/16/2012   MCV 82.4 01/16/2012   PLT 342 01/16/2012     Chemistry  Component Value Date/Time   NA 139 12/13/2011 1026   K 5.1 12/13/2011 1026   CL 102 12/13/2011 1026   CO2 29 12/13/2011 1026   BUN 23 12/13/2011 1026   CREATININE 1.25 12/13/2011 1026      Component Value Date/Time   CALCIUM 9.4 12/13/2011 1026   ALKPHOS 100 12/13/2011 1026   AST  37 12/13/2011 1026   ALT 45 12/13/2011 1026   BILITOT 0.6 12/13/2011 1026        Impression and Plan:  71 year old old with the following issues:  1. Polycythemia differential diagnosis including reactive versus a primary cause such as polycythemia vera. Given the fact that his counts are dropping occluding his hemoglobin approaching normal levels as well as his white cell count and platelets are all within normal range more inclined to consider this is a reactive process for the time being and continue to observe him. Despite he has a positive JAK 2 mutation, and will hold off on possible phlebotomies continued observation for the time being. I will repeat his blood counts in 4 months if his hemoglobin continues to rise at that time I will consider therapeutic phlebotomy.  2. Thrombosis prophylaxis: He is continued to be anticoagulated with Coumadin at low-dose aspirin.  [facility], MD 4/9/20139:10 AM

## 2012-01-16 NOTE — Telephone Encounter (Signed)
gve the pt his aug 2013 appt calendar °

## 2012-01-17 ENCOUNTER — Encounter (HOSPITAL_COMMUNITY)
Admission: RE | Admit: 2012-01-17 | Discharge: 2012-01-17 | Disposition: A | Discharge status: Home / Self Care | Payer: Medicare Other | Source: Ambulatory Visit | Attending: Cardiovascular Disease | Admitting: Cardiovascular Disease

## 2012-01-19 ENCOUNTER — Encounter (HOSPITAL_COMMUNITY)
Admission: RE | Admit: 2012-01-19 | Discharge: 2012-01-19 | Disposition: A | Discharge status: Home / Self Care | Payer: Medicare Other | Source: Ambulatory Visit | Attending: Cardiovascular Disease | Admitting: Cardiovascular Disease

## 2012-01-22 ENCOUNTER — Encounter (HOSPITAL_COMMUNITY)
Admission: RE | Admit: 2012-01-22 | Discharge: 2012-01-22 | Disposition: A | Discharge status: Home / Self Care | Payer: Medicare Other | Source: Ambulatory Visit | Attending: Cardiovascular Disease | Admitting: Cardiovascular Disease

## 2012-01-24 ENCOUNTER — Encounter (HOSPITAL_COMMUNITY)
Admission: RE | Admit: 2012-01-24 | Discharge: 2012-01-24 | Disposition: A | Discharge status: Home / Self Care | Payer: Medicare Other | Source: Ambulatory Visit | Attending: Cardiovascular Disease | Admitting: Cardiovascular Disease

## 2012-01-26 ENCOUNTER — Encounter (HOSPITAL_COMMUNITY)
Admission: RE | Admit: 2012-01-26 | Discharge: 2012-01-26 | Disposition: A | Discharge status: Home / Self Care | Payer: Medicare Other | Source: Ambulatory Visit | Attending: Cardiovascular Disease | Admitting: Cardiovascular Disease

## 2012-01-29 ENCOUNTER — Encounter (HOSPITAL_COMMUNITY)
Admission: RE | Admit: 2012-01-29 | Discharge: 2012-01-29 | Disposition: A | Discharge status: Home / Self Care | Payer: Medicare Other | Source: Ambulatory Visit | Attending: Cardiovascular Disease | Admitting: Cardiovascular Disease

## 2012-01-29 NOTE — Progress Notes (Signed)
Pt c/o rash present on both arms and across torso.  Pt reports it is itching and states he has used cortisone cream with some relief of itching.  Pt has not tried benadryl po.  Pt states the rash began 2-3 days ago.  Denies rash elsewhere.  Rash is red pinpoint on abdomen and back both arms. Pt denies change in medication or diet.  Pt admits to yard work over the weekend.  Will make Dr. Allyson Sabal aware and further advise.  Pt will take benadryl 25mg  per package directions  po to relieve itching sx.

## 2012-01-31 ENCOUNTER — Encounter (HOSPITAL_COMMUNITY)
Admission: RE | Admit: 2012-01-31 | Discharge: 2012-01-31 | Disposition: A | Discharge status: Home / Self Care | Payer: Medicare Other | Source: Ambulatory Visit | Attending: Cardiovascular Disease | Admitting: Cardiovascular Disease

## 2012-01-31 NOTE — Progress Notes (Signed)
Pt reports relief of itching and rash since taking benadryl 25mg  qhs.  Pt reports rash is clearing but has not completely resolved.  Pt advised to contact PCP if rash continues or sx recur.  Understanding verbalized

## 2012-02-02 ENCOUNTER — Encounter (HOSPITAL_COMMUNITY)
Admission: RE | Admit: 2012-02-02 | Discharge: 2012-02-02 | Disposition: A | Discharge status: Home / Self Care | Payer: Medicare Other | Source: Ambulatory Visit | Attending: Cardiovascular Disease | Admitting: Cardiovascular Disease

## 2012-02-05 ENCOUNTER — Encounter (HOSPITAL_COMMUNITY)
Admission: RE | Admit: 2012-02-05 | Discharge: 2012-02-05 | Disposition: A | Discharge status: Home / Self Care | Payer: Medicare Other | Source: Ambulatory Visit | Attending: Cardiovascular Disease | Admitting: Cardiovascular Disease

## 2012-02-07 ENCOUNTER — Encounter (HOSPITAL_COMMUNITY)
Admission: RE | Admit: 2012-02-07 | Discharge: 2012-02-07 | Disposition: A | Discharge status: Home / Self Care | Payer: Medicare Other | Source: Ambulatory Visit | Attending: Cardiovascular Disease | Admitting: Cardiovascular Disease

## 2012-02-07 DIAGNOSIS — I2109 ST elevation (STEMI) myocardial infarction involving other coronary artery of anterior wall: Secondary | ICD-10-CM | POA: Insufficient documentation

## 2012-02-07 DIAGNOSIS — I2782 Chronic pulmonary embolism: Secondary | ICD-10-CM | POA: Insufficient documentation

## 2012-02-07 DIAGNOSIS — Z7901 Long term (current) use of anticoagulants: Secondary | ICD-10-CM | POA: Insufficient documentation

## 2012-02-07 DIAGNOSIS — I2582 Chronic total occlusion of coronary artery: Secondary | ICD-10-CM | POA: Insufficient documentation

## 2012-02-07 DIAGNOSIS — E039 Hypothyroidism, unspecified: Secondary | ICD-10-CM | POA: Insufficient documentation

## 2012-02-07 DIAGNOSIS — I2510 Atherosclerotic heart disease of native coronary artery without angina pectoris: Secondary | ICD-10-CM | POA: Insufficient documentation

## 2012-02-07 DIAGNOSIS — I2589 Other forms of chronic ischemic heart disease: Secondary | ICD-10-CM | POA: Insufficient documentation

## 2012-02-07 DIAGNOSIS — Z87891 Personal history of nicotine dependence: Secondary | ICD-10-CM | POA: Insufficient documentation

## 2012-02-07 DIAGNOSIS — Z9861 Coronary angioplasty status: Secondary | ICD-10-CM | POA: Insufficient documentation

## 2012-02-07 DIAGNOSIS — Z5189 Encounter for other specified aftercare: Secondary | ICD-10-CM | POA: Insufficient documentation

## 2012-02-07 NOTE — Progress Notes (Signed)
During cool down, pt noted to have inverted T waves.  Pt was asymptomatic and his T waves did flip back to upright which is his usual rhythm. Will fax to Dr. Hazle Coca office for review.

## 2012-02-09 ENCOUNTER — Encounter (HOSPITAL_COMMUNITY)
Admission: RE | Admit: 2012-02-09 | Discharge: 2012-02-09 | Disposition: A | Discharge status: Home / Self Care | Payer: Medicare Other | Source: Ambulatory Visit | Attending: Cardiovascular Disease | Admitting: Cardiovascular Disease

## 2012-02-12 ENCOUNTER — Encounter (HOSPITAL_COMMUNITY)
Admission: RE | Admit: 2012-02-12 | Discharge: 2012-02-12 | Disposition: A | Discharge status: Home / Self Care | Payer: Medicare Other | Source: Ambulatory Visit | Attending: Cardiovascular Disease | Admitting: Cardiovascular Disease

## 2012-02-14 ENCOUNTER — Encounter (HOSPITAL_COMMUNITY)
Admission: RE | Admit: 2012-02-14 | Discharge: 2012-02-14 | Disposition: A | Discharge status: Home / Self Care | Payer: Medicare Other | Source: Ambulatory Visit | Attending: Cardiovascular Disease | Admitting: Cardiovascular Disease

## 2012-02-16 ENCOUNTER — Encounter (HOSPITAL_COMMUNITY)
Admission: RE | Admit: 2012-02-16 | Discharge: 2012-02-16 | Disposition: A | Discharge status: Home / Self Care | Payer: Medicare Other | Source: Ambulatory Visit | Attending: Cardiovascular Disease | Admitting: Cardiovascular Disease

## 2012-02-19 ENCOUNTER — Encounter (HOSPITAL_COMMUNITY)
Admission: RE | Admit: 2012-02-19 | Discharge: 2012-02-19 | Disposition: A | Discharge status: Home / Self Care | Payer: Medicare Other | Source: Ambulatory Visit | Attending: Cardiovascular Disease | Admitting: Cardiovascular Disease

## 2012-02-21 ENCOUNTER — Encounter (HOSPITAL_COMMUNITY)
Admission: RE | Admit: 2012-02-21 | Discharge: 2012-02-21 | Disposition: A | Discharge status: Home / Self Care | Payer: Medicare Other | Source: Ambulatory Visit | Attending: Cardiovascular Disease | Admitting: Cardiovascular Disease

## 2012-02-23 ENCOUNTER — Encounter (HOSPITAL_COMMUNITY)
Admission: RE | Admit: 2012-02-23 | Discharge: 2012-02-23 | Disposition: A | Discharge status: Home / Self Care | Payer: Medicare Other | Source: Ambulatory Visit | Attending: Cardiovascular Disease | Admitting: Cardiovascular Disease

## 2012-02-26 ENCOUNTER — Encounter (HOSPITAL_COMMUNITY): Payer: Medicare Other

## 2012-02-26 ENCOUNTER — Telehealth (HOSPITAL_COMMUNITY): Payer: Self-pay | Admitting: Cardiac Rehabilitation

## 2012-02-28 ENCOUNTER — Encounter (HOSPITAL_COMMUNITY)
Admission: RE | Admit: 2012-02-28 | Discharge: 2012-02-28 | Disposition: A | Discharge status: Home / Self Care | Payer: Medicare Other | Source: Ambulatory Visit | Attending: Cardiovascular Disease | Admitting: Cardiovascular Disease

## 2012-03-01 ENCOUNTER — Encounter (HOSPITAL_COMMUNITY)
Admission: RE | Admit: 2012-03-01 | Discharge: 2012-03-01 | Disposition: A | Discharge status: Home / Self Care | Payer: Medicare Other | Source: Ambulatory Visit | Attending: Cardiovascular Disease | Admitting: Cardiovascular Disease

## 2012-03-04 ENCOUNTER — Encounter (HOSPITAL_COMMUNITY): Payer: Medicare Other

## 2012-03-06 ENCOUNTER — Encounter (HOSPITAL_COMMUNITY)
Admission: RE | Admit: 2012-03-06 | Discharge: 2012-03-06 | Disposition: A | Discharge status: Home / Self Care | Payer: Medicare Other | Source: Ambulatory Visit | Attending: Cardiovascular Disease | Admitting: Cardiovascular Disease

## 2012-03-08 ENCOUNTER — Encounter (HOSPITAL_COMMUNITY)
Admission: RE | Admit: 2012-03-08 | Discharge: 2012-03-08 | Disposition: A | Discharge status: Home / Self Care | Payer: Medicare Other | Source: Ambulatory Visit | Attending: Cardiovascular Disease | Admitting: Cardiovascular Disease

## 2012-03-11 ENCOUNTER — Encounter (HOSPITAL_COMMUNITY)
Admission: RE | Admit: 2012-03-11 | Discharge: 2012-03-11 | Disposition: A | Discharge status: Home / Self Care | Payer: Medicare Other | Source: Ambulatory Visit | Attending: Cardiovascular Disease | Admitting: Cardiovascular Disease

## 2012-03-11 DIAGNOSIS — I2782 Chronic pulmonary embolism: Secondary | ICD-10-CM | POA: Insufficient documentation

## 2012-03-11 DIAGNOSIS — I2510 Atherosclerotic heart disease of native coronary artery without angina pectoris: Secondary | ICD-10-CM | POA: Insufficient documentation

## 2012-03-11 DIAGNOSIS — I2582 Chronic total occlusion of coronary artery: Secondary | ICD-10-CM | POA: Insufficient documentation

## 2012-03-11 DIAGNOSIS — Z87891 Personal history of nicotine dependence: Secondary | ICD-10-CM | POA: Insufficient documentation

## 2012-03-11 DIAGNOSIS — Z5189 Encounter for other specified aftercare: Secondary | ICD-10-CM | POA: Insufficient documentation

## 2012-03-11 DIAGNOSIS — I2109 ST elevation (STEMI) myocardial infarction involving other coronary artery of anterior wall: Secondary | ICD-10-CM | POA: Insufficient documentation

## 2012-03-11 DIAGNOSIS — Z7901 Long term (current) use of anticoagulants: Secondary | ICD-10-CM | POA: Insufficient documentation

## 2012-03-11 DIAGNOSIS — Z9861 Coronary angioplasty status: Secondary | ICD-10-CM | POA: Insufficient documentation

## 2012-03-11 DIAGNOSIS — E039 Hypothyroidism, unspecified: Secondary | ICD-10-CM | POA: Insufficient documentation

## 2012-03-11 DIAGNOSIS — I2589 Other forms of chronic ischemic heart disease: Secondary | ICD-10-CM | POA: Insufficient documentation

## 2012-03-13 ENCOUNTER — Encounter (HOSPITAL_COMMUNITY)
Admission: RE | Admit: 2012-03-13 | Discharge: 2012-03-13 | Disposition: A | Discharge status: Home / Self Care | Payer: Medicare Other | Source: Ambulatory Visit | Attending: Cardiovascular Disease | Admitting: Cardiovascular Disease

## 2012-03-15 ENCOUNTER — Encounter (HOSPITAL_COMMUNITY)
Admission: RE | Admit: 2012-03-15 | Discharge: 2012-03-15 | Disposition: A | Discharge status: Home / Self Care | Payer: Medicare Other | Source: Ambulatory Visit | Attending: Cardiovascular Disease | Admitting: Cardiovascular Disease

## 2012-03-18 ENCOUNTER — Encounter (HOSPITAL_COMMUNITY)
Admission: RE | Admit: 2012-03-18 | Discharge: 2012-03-18 | Disposition: A | Discharge status: Home / Self Care | Payer: Medicare Other | Source: Ambulatory Visit | Attending: Cardiovascular Disease | Admitting: Cardiovascular Disease

## 2012-03-20 ENCOUNTER — Encounter (HOSPITAL_COMMUNITY)
Admission: RE | Admit: 2012-03-20 | Discharge: 2012-03-20 | Disposition: A | Discharge status: Home / Self Care | Payer: Medicare Other | Source: Ambulatory Visit | Attending: Cardiovascular Disease | Admitting: Cardiovascular Disease

## 2012-03-22 ENCOUNTER — Encounter (HOSPITAL_COMMUNITY)
Admission: RE | Admit: 2012-03-22 | Discharge: 2012-03-22 | Disposition: A | Discharge status: Home / Self Care | Payer: Medicare Other | Source: Ambulatory Visit | Attending: Cardiovascular Disease | Admitting: Cardiovascular Disease

## 2012-03-25 ENCOUNTER — Encounter (HOSPITAL_COMMUNITY)
Admission: RE | Admit: 2012-03-25 | Discharge: 2012-03-25 | Disposition: A | Discharge status: Home / Self Care | Payer: Medicare Other | Source: Ambulatory Visit | Attending: Cardiovascular Disease | Admitting: Cardiovascular Disease

## 2012-03-27 ENCOUNTER — Encounter (HOSPITAL_COMMUNITY)
Admission: RE | Admit: 2012-03-27 | Discharge: 2012-03-27 | Disposition: A | Discharge status: Home / Self Care | Payer: Medicare Other | Source: Ambulatory Visit | Attending: Cardiovascular Disease | Admitting: Cardiovascular Disease

## 2012-03-27 ENCOUNTER — Telehealth: Payer: Self-pay | Admitting: Oncology

## 2012-03-27 NOTE — Telephone Encounter (Signed)
pt called and moved appt to 8/15  aom

## 2012-03-27 NOTE — Progress Notes (Addendum)
Pt graduated today from cardiac rehab phase II program.  Medication list reconciled.

## 2012-03-28 NOTE — Progress Notes (Signed)
Cardiac Rehabilitation Program Outcomes Report   Orientation:  12/28/2011 Graduate Date:  03/27/2012 Discharge Date:   # of sessions completed: 36  Cardiologist: Wilhemina Cash MD:  Polite Class Time:  0815  A.  Exercise Program:  Tolerates exercise @ 6.7 METS for 30 minutes, Improved functional capacity  8.11 %, Improved  muscular strength  26.09 %, Improved  flexibility 5.0 % and Discharged to home exercise program.  Anticipated compliance:  fair  B.  Mental Health:    C.  Education/Instruction/Skills  Accurately checks own pulse, Knows THR for exercise, Uses Perceived Exertion Scale and Attended 11 education classes    D.  Nutrition/Weight Control/Body Composition:  % Body Fat  24.6 and Patient has lost 1.7 kg BMI 25.1 Pt following a step 2 Therapeutic Lifestyle Changes diet. Pt with 3.9% decrease in %body fat.  Section Completed by: Mickle Plumb, M.Ed, RD, LDN, CDE    E.  Blood Lipids    Lab Results  Component Value Date   CHOL 140 12/03/2011   HDL 36* 12/03/2011   LDLCALC 84 12/03/2011   TRIG 102 12/03/2011   CHOLHDL 3.9 12/03/2011    F.  Lifestyle Changes:    G.  Symptoms noted with exercise:  Questionable angina, Musculoskeletal problems, Exaggerated post exercise hypotension and Exertional hypertension  Report Completed By:  Electronically signed by Harriett Sine MS on Thursday March 28 2012 at 0839   Comments:         Pt successfully completed cardiac rehab attending 36/36 exercise and 15 education sessions. Pt had excellent participation.   Pt VSS, Telemetry-NSR.  Pt METS increased from 2.1 at baseline to 6.7 upon completion of program. Pt plans to exercise on his own.  Pt did not have hospital admission during cardiac rehab period.  Pt has made positive lifestyle changes and should be commended for his efforts.  Pt was a pleasure to work with.  Thank you for the referral.

## 2012-03-29 ENCOUNTER — Encounter (HOSPITAL_COMMUNITY): Payer: Medicare Other

## 2012-04-01 ENCOUNTER — Encounter (HOSPITAL_COMMUNITY): Payer: Medicare Other

## 2012-04-03 ENCOUNTER — Encounter (HOSPITAL_COMMUNITY): Payer: Medicare Other

## 2012-04-05 ENCOUNTER — Encounter (HOSPITAL_COMMUNITY): Payer: Medicare Other

## 2012-05-17 ENCOUNTER — Other Ambulatory Visit: Payer: Medicare Other | Admitting: Lab

## 2012-05-17 ENCOUNTER — Ambulatory Visit: Payer: Medicare Other | Admitting: Oncology

## 2012-05-23 ENCOUNTER — Telehealth: Payer: Self-pay | Admitting: Oncology

## 2012-05-23 ENCOUNTER — Ambulatory Visit (HOSPITAL_BASED_OUTPATIENT_CLINIC_OR_DEPARTMENT_OTHER): Payer: Medicare Other | Admitting: Oncology

## 2012-05-23 ENCOUNTER — Other Ambulatory Visit (HOSPITAL_BASED_OUTPATIENT_CLINIC_OR_DEPARTMENT_OTHER): Payer: Medicare Other | Admitting: Lab

## 2012-05-23 ENCOUNTER — Telehealth: Payer: Self-pay | Admitting: *Deleted

## 2012-05-23 VITALS — BP 107/68 | HR 47 | Temp 96.7°F | Resp 20 | Ht 71.0 in | Wt 189.5 lb

## 2012-05-23 DIAGNOSIS — D751 Secondary polycythemia: Secondary | ICD-10-CM

## 2012-05-23 DIAGNOSIS — Z8546 Personal history of malignant neoplasm of prostate: Secondary | ICD-10-CM

## 2012-05-23 DIAGNOSIS — D45 Polycythemia vera: Secondary | ICD-10-CM

## 2012-05-23 DIAGNOSIS — Z7901 Long term (current) use of anticoagulants: Secondary | ICD-10-CM

## 2012-05-23 DIAGNOSIS — Z86711 Personal history of pulmonary embolism: Secondary | ICD-10-CM

## 2012-05-23 LAB — CBC WITH DIFFERENTIAL/PLATELET
Eosinophils Absolute: 0.3 10*3/uL (ref 0.0–0.5)
HGB: 19.5 g/dL — ABNORMAL HIGH (ref 13.0–17.1)
MCV: 79 fL — ABNORMAL LOW (ref 79.3–98.0)
MONO%: 12.9 % (ref 0.0–14.0)
NEUT#: 8.8 10*3/uL — ABNORMAL HIGH (ref 1.5–6.5)
RBC: 7.55 10*6/uL — ABNORMAL HIGH (ref 4.20–5.82)
RDW: 15.4 % — ABNORMAL HIGH (ref 11.0–14.6)
WBC: 12 10*3/uL — ABNORMAL HIGH (ref 4.0–10.3)
lymph#: 1.3 10*3/uL (ref 0.9–3.3)

## 2012-05-23 LAB — COMPREHENSIVE METABOLIC PANEL
AST: 33 U/L (ref 0–37)
Albumin: 4.3 g/dL (ref 3.5–5.2)
Alkaline Phosphatase: 79 U/L (ref 39–117)
Calcium: 9.4 mg/dL (ref 8.4–10.5)
Chloride: 101 mEq/L (ref 96–112)
Glucose, Bld: 79 mg/dL (ref 70–99)
Potassium: 5.4 mEq/L — ABNORMAL HIGH (ref 3.5–5.3)
Sodium: 138 mEq/L (ref 135–145)
Total Protein: 7 g/dL (ref 6.0–8.3)

## 2012-05-23 NOTE — Progress Notes (Signed)
Hematology and Oncology Follow Up Visit  Gregory Blankenship [id number] 1941-09-08 71 y.o. 05/23/2012 9:35 AM   Principle Diagnosis: 71 year old gentleman with polycythemia diagnosed February of 2013 presented with a hemoglobin of 18. The differential diagnosis is reactive versus primary polycythemia. He is JAK-2 positive.  Secondary diagnosis: History of prostate cancer diagnosed in 2011. He is also history of coronary arteries disease had a pulmonary embolus.  Interim History: Patient presents today for a followup visit. He is a man with elevated Hgb and WBC. The differential diagnosis would include reactive polycythemia related to his recent cardiac event as well as recent hospitalization. His workup so far have been unremarkable including CT scan of the abdomen and pelvis. He did have a JAK 2 positive mutation. Clinically he is feeling a lot better with no recent complications. He finished cardiac rehabilitation and had not had any other events. He had not had any bleeding complications he had not had any thrombotic events she is continued to be anticoagulated with Coumadin and aspirin. He is very active and exercise regularly. He is not reporting any headaches, chest pain or difficulty breathing.   Medications: I have reviewed the patient's current medications. Current outpatient prescriptions:acetaminophen (TYLENOL) 325 MG tablet, Take 2 tablets (650 mg total) by mouth every 4 (four) hours as needed., Disp: , Rfl: ;  aspirin 81 MG chewable tablet, Chew 1 tablet (81 mg total) by mouth daily., Disp: , Rfl: ;  carvedilol (COREG) 6.25 MG tablet, Take 6.25 mg by mouth 2 (two) times daily with a meal., Disp: , Rfl: ;  isosorbide mononitrate (IMDUR) 30 MG 24 hr tablet, Take 30 mg by mouth daily., Disp: , Rfl:  levothyroxine (SYNTHROID, LEVOTHROID) 88 MCG tablet, Take 88 mcg by mouth daily., Disp: , Rfl: ;  lisinopril (PRINIVIL,ZESTRIL) 5 MG tablet, Take 1 tablet (5 mg total) by mouth daily., Disp: 30 tablet,  Rfl: 5;  nitroGLYCERIN (NITROSTAT) 0.4 MG SL tablet, Place 1 tablet (0.4 mg total) under the tongue every 5 (five) minutes as needed for chest pain., Disp: 25 tablet, Rfl: 2 simvastatin (ZOCOR) 20 MG tablet, Take 20 mg by mouth every evening. Managed by PCP, Dr. Nehemiah Settle, Disp: , Rfl: ;  warfarin (COUMADIN) 5 MG tablet, Take 5 mg by mouth daily. Managed by PCP, Dr. Nehemiah Settle, Disp: , Rfl:   Allergies:  Allergies  Allergen Reactions  . Erythromycin Nausea Only    Past Medical History, Surgical history, Social history, and Family History were reviewed and updated.  Review of Systems: Constitutional:  Negative for fever, chills, night sweats, anorexia, weight loss, pain. Cardiovascular: no chest pain or dyspnea on exertion Respiratory: no cough, shortness of breath, or wheezing Neurological: no TIA or stroke symptoms Dermatological: negative ENT: negative Skin: Negative. Gastrointestinal: no abdominal pain, change in bowel habits, or black or bloody stools Genito-Urinary: no dysuria, trouble voiding, or hematuria Hematological and Lymphatic: negative Breast: negative Musculoskeletal: negative Remaining ROS negative. Physical Exam: Blood pressure 107/68, pulse 47, temperature 96.7 F (35.9 C), temperature source Oral, resp. rate 20, height 5\' 11"  (1.803 m), weight 189 lb 8 oz (85.957 kg). ECOG: 1 General appearance: Alert and oriented and appears in no active distress. Head: Normocephalic, atraumatic. Pupils equal, round, reactive to light. Oral mucosa moist and pink.  Neck: Supple without adenopathy.  Heart: Regular rate and rhythm. S1, S2.  Lungs: Clear to auscultation. No rhonchi, wheeze, no dullness to percussion.  Abdomen: Soft, nontender. No hepatosplenomegaly.  Extremities: No clubbing, cyanosis, or edema.  Neurologic: Intact motor,  sensory and deep tendon reflexes.   Lab Results: Lab Results  Component Value Date   WBC 12.0* 05/23/2012   HGB 19.5* 05/23/2012   HCT 59.6*  05/23/2012   MCV 79.0* 05/23/2012   PLT 388 05/23/2012     Chemistry      Component Value Date/Time   NA 139 12/13/2011 1026   K 5.1 12/13/2011 1026   CL 102 12/13/2011 1026   CO2 29 12/13/2011 1026   BUN 23 12/13/2011 1026   CREATININE 1.25 12/13/2011 1026      Component Value Date/Time   CALCIUM 9.4 12/13/2011 1026   ALKPHOS 100 12/13/2011 1026   AST 37 12/13/2011 1026   ALT 45 12/13/2011 1026   BILITOT 0.6 12/13/2011 1026        Impression and Plan:  71 year old old with the following issues:  1. Polycythemia differential diagnosis including reactive versus a primary cause such as polycythemia vera. 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 Given his Hgb is up to 19.5, I will start phlebotomies once every 2 months to get his Hgb below 18. I will repeat his counts in 2 months.  2. Thrombosis prophylaxis: He is continued to be anticoagulated with Coumadin at low-dose aspirin.  [facility], MD 8/15/20139:35 AM

## 2012-05-23 NOTE — Telephone Encounter (Signed)
Per staff message and POF I have scheduled appts.  JMW  

## 2012-05-23 NOTE — Telephone Encounter (Signed)
appts made and printed for pt,pt aware that i will call with 8/20 phlebot

## 2012-05-27 ENCOUNTER — Telehealth: Payer: Self-pay

## 2012-05-27 ENCOUNTER — Encounter: Payer: Self-pay | Admitting: Dietician

## 2012-05-27 ENCOUNTER — Other Ambulatory Visit: Payer: Self-pay | Admitting: *Deleted

## 2012-05-27 NOTE — Telephone Encounter (Signed)
Per pt request I have rescheduled appt for 8/23. TMB

## 2012-05-27 NOTE — Progress Notes (Signed)
Brief Out-patient Oncology Nutrition Note  Reason: Patient Screened Positive For Nutrition Risk For Unintentional Weight Loss and Decreased Appetite.   Gregory Blankenship is a 71 year old male patient of Dr. Clelia Croft, diagnosed with polycythemia. Contacted patient via telephone for positive nutrition risk. Spoke with patient's wife. She reported patient's appetite and intake are very well. She reported patient has been trying to loose weight after his heart attack one year age. She reported he works out daily.  She was without any further nutrition related questions. Provided RD contact information and instructed patient to contact RD for future questions.   RD available for nutrition needs.   Gregory Finn, MS, RD, LDN [phone]

## 2012-05-31 ENCOUNTER — Ambulatory Visit: Payer: Medicare Other

## 2012-05-31 ENCOUNTER — Ambulatory Visit (HOSPITAL_BASED_OUTPATIENT_CLINIC_OR_DEPARTMENT_OTHER): Payer: Medicare Other

## 2012-05-31 DIAGNOSIS — D45 Polycythemia vera: Secondary | ICD-10-CM

## 2012-05-31 NOTE — Patient Instructions (Addendum)
Therapeutic Phlebotomy Therapeutic phlebotomy is the controlled removal of blood from your body for the purpose of treating a medical condition. It is similar to donating blood. Usually, about a pint (470 mL) of blood is removed. The average adult has 9 to 12 pints (4.3 to 5.7 L) of blood. Therapeutic phlebotomy may be used to treat the following medical conditions:  Hemochromatosis. This is a condition in which there is too much iron in the blood.   Polycythemia vera. This is a condition in which there are too many red cells in the blood.   Porphyria cutanea tarda. This is a disease usually passed from one generation to the next (inherited). It is a condition in which an important part of hemoglobin is not made properly. This results in the build up of abnormal amounts of porphyrins in the body.   Sickle cell disease. This is an inherited disease. It is a condition in which the red blood cells form an abnormal crescent shape rather than a round shape.  LET YOUR CAREGIVER KNOW ABOUT:  Allergies.   Medicines taken including herbs, eyedrops, over-the-counter medicines, and creams.   Use of steroids (by mouth or creams).   Previous problems with anesthetics or numbing medicine.   History of blood clots.   History of bleeding or blood problems.   Previous surgery.   Possibility of pregnancy, if this applies.  RISKS AND COMPLICATIONS This is a simple and safe procedure. Problems are unlikely. However, problems can occur and may include:  Nausea or lightheadedness.   Low blood pressure.   Soreness, bleeding, swelling, or bruising at the needle insertion site.   Infection.  BEFORE THE PROCEDURE  This is a procedure that can be done as an outpatient. Confirm the time that you need to arrive for your procedure. Confirm whether there is a need to fast or withhold any medications. It is helpful to wear clothing with sleeves that can be raised above the elbow. A blood sample may be done  to determine the amount of red blood cells or iron in your blood. Plan ahead of time to have someone drive you home after the procedure. PROCEDURE The entire procedure from preparation through recovery takes about 1 hour. The actual collection takes about 10 to 15 minutes.  A needle will be inserted into your vein.   Tubing and a collection bag will be attached to that needle.   Blood will flow through the needle and tubing into the collection bag.   You may be asked to open and close your hand slowly and continuously during the entire collection.   Once the specified amount of blood has been removed from your body, the collection bag and tubing will be clamped.   The needle will be removed.   Pressure will be held on the site of the needle insertion to stop the bleeding. Then a bandage will be placed over the needle insertion site.  AFTER THE PROCEDURE  Your recovery will be assessed and monitored. If there are no problems, as an outpatient, you should be able to go home shortly after the procedure.  Document Released: 02/27/2011 Document Revised: 09/14/2011 Document Reviewed: 02/27/2011 [employer] Patient Information 2012 [employer]. 

## 2012-07-23 ENCOUNTER — Other Ambulatory Visit (HOSPITAL_BASED_OUTPATIENT_CLINIC_OR_DEPARTMENT_OTHER): Payer: Medicare Other | Admitting: Lab

## 2012-07-23 ENCOUNTER — Ambulatory Visit (HOSPITAL_BASED_OUTPATIENT_CLINIC_OR_DEPARTMENT_OTHER): Payer: Medicare Other | Admitting: Oncology

## 2012-07-23 ENCOUNTER — Telehealth: Payer: Self-pay | Admitting: *Deleted

## 2012-07-23 ENCOUNTER — Ambulatory Visit (HOSPITAL_BASED_OUTPATIENT_CLINIC_OR_DEPARTMENT_OTHER): Payer: Medicare Other

## 2012-07-23 ENCOUNTER — Telehealth: Payer: Self-pay | Admitting: Oncology

## 2012-07-23 VITALS — BP 109/72 | HR 46 | Temp 97.1°F | Resp 20 | Ht 71.0 in | Wt 193.7 lb

## 2012-07-23 DIAGNOSIS — Z86711 Personal history of pulmonary embolism: Secondary | ICD-10-CM

## 2012-07-23 DIAGNOSIS — D751 Secondary polycythemia: Secondary | ICD-10-CM

## 2012-07-23 DIAGNOSIS — D45 Polycythemia vera: Secondary | ICD-10-CM

## 2012-07-23 DIAGNOSIS — Z8546 Personal history of malignant neoplasm of prostate: Secondary | ICD-10-CM

## 2012-07-23 LAB — CBC WITH DIFFERENTIAL/PLATELET
EOS%: 1.9 % (ref 0.0–7.0)
MCH: 25.3 pg — ABNORMAL LOW (ref 27.2–33.4)
MCV: 78.2 fL — ABNORMAL LOW (ref 79.3–98.0)
MONO%: 12.8 % (ref 0.0–14.0)
NEUT#: 11.3 10*3/uL — ABNORMAL HIGH (ref 1.5–6.5)
RBC: 7.32 10*6/uL — ABNORMAL HIGH (ref 4.20–5.82)
RDW: 16.3 % — ABNORMAL HIGH (ref 11.0–14.6)

## 2012-07-23 LAB — COMPREHENSIVE METABOLIC PANEL (CC13)
ALT: 29 U/L (ref 0–55)
AST: 29 U/L (ref 5–34)
Albumin: 3.9 g/dL (ref 3.5–5.0)
Alkaline Phosphatase: 95 U/L (ref 40–150)
Potassium: 5.2 mEq/L — ABNORMAL HIGH (ref 3.5–5.1)
Sodium: 136 mEq/L (ref 136–145)
Total Protein: 6.9 g/dL (ref 6.4–8.3)

## 2012-07-23 NOTE — Progress Notes (Signed)
1050- Therapeutic phlebotomy complete.  531 grams blood removed.  Pt tolerated procedure well.  Nourishments provided afterwards-dhp, rn 1120-Pt states he feels well.  VSS.  D/c'd home-dhp, rn

## 2012-07-23 NOTE — Progress Notes (Signed)
Hematology and Oncology Follow Up Visit  Gregory Blankenship [id number] 10/22/1940 71 y.o. 07/23/2012 10:21 AM   Principle Diagnosis: 71 year old gentleman with polycythemia diagnosed February of 2013 presented with a hemoglobin of 18. The differential diagnosis is reactive versus primary polycythemia. He is JAK-2 positive.  Secondary diagnosis: History of prostate cancer diagnosed in 2011. He is also history of coronary arteries disease had a pulmonary embolus.  Interim History: Patient presents today for a followup visit. He is a man with elevated Hgb and WBC. The differential diagnosis would include reactive polycythemia related to his recent cardiac event as well as recent hospitalization. His workup so far have been unremarkable including CT scan of the abdomen and pelvis. He did have a JAK 2 positive mutation. Clinically he is feeling a lot better with no recent complications. He tolerated phlebotomy 2 months ago with some improvement in his overall energy.  He finished cardiac rehabilitation and had not had any other events. He had not had any bleeding complications he had not had any thrombotic events she is continued to be anticoagulated with Coumadin and aspirin. He is very active and exercise regularly. He is not reporting any headaches, chest pain or difficulty breathing.   Medications: I have reviewed the patient's current medications. Current outpatient prescriptions:acetaminophen (TYLENOL) 325 MG tablet, Take 2 tablets (650 mg total) by mouth every 4 (four) hours as needed., Disp: , Rfl: ;  aspirin 81 MG chewable tablet, Chew 1 tablet (81 mg total) by mouth daily., Disp: , Rfl: ;  carvedilol (COREG) 6.25 MG tablet, Take 6.25 mg by mouth 2 (two) times daily with a meal., Disp: , Rfl: ;  isosorbide mononitrate (IMDUR) 30 MG 24 hr tablet, Take 30 mg by mouth daily., Disp: , Rfl:  levothyroxine (SYNTHROID, LEVOTHROID) 88 MCG tablet, Take 88 mcg by mouth daily., Disp: , Rfl: ;  lisinopril  (PRINIVIL,ZESTRIL) 5 MG tablet, Take 1 tablet (5 mg total) by mouth daily., Disp: 30 tablet, Rfl: 5;  nitroGLYCERIN (NITROSTAT) 0.4 MG SL tablet, Place 1 tablet (0.4 mg total) under the tongue every 5 (five) minutes as needed for chest pain., Disp: 25 tablet, Rfl: 2 simvastatin (ZOCOR) 20 MG tablet, Take 20 mg by mouth every evening. Managed by PCP, Dr. Nehemiah Settle, Disp: , Rfl: ;  warfarin (COUMADIN) 5 MG tablet, Take 5 mg by mouth daily. Managed by PCP, Dr. Nehemiah Settle, Disp: , Rfl:   Allergies:  Allergies  Allergen Reactions  . Erythromycin Nausea Only    Past Medical History, Surgical history, Social history, and Family History were reviewed and updated.  Review of Systems: Constitutional:  Negative for fever, chills, night sweats, anorexia, weight loss, pain. Cardiovascular: no chest pain or dyspnea on exertion Respiratory: no cough, shortness of breath, or wheezing Neurological: no TIA or stroke symptoms Dermatological: negative ENT: negative Skin: Negative. Gastrointestinal: no abdominal pain, change in bowel habits, or black or bloody stools Genito-Urinary: no dysuria, trouble voiding, or hematuria Hematological and Lymphatic: negative Breast: negative Musculoskeletal: negative Remaining ROS negative. Physical Exam: Blood pressure 109/72, pulse 46, temperature 97.1 F (36.2 C), temperature source Oral, resp. rate 20, height 5\' 11"  (1.803 m), weight 193 lb 11.2 oz (87.862 kg). ECOG: 1 General appearance: Alert and oriented and appears in no active distress. Head: Normocephalic, atraumatic. Pupils equal, round, reactive to light. Oral mucosa moist and pink.  Neck: Supple without adenopathy.  Heart: Regular rate and rhythm. S1, S2.  Lungs: Clear to auscultation. No rhonchi, wheeze, no dullness to percussion.  Abdomen: Soft,  nontender. No hepatosplenomegaly.  Extremities: No clubbing, cyanosis, or edema.  Neurologic: Intact motor, sensory and deep tendon reflexes.   Lab Results: Lab  Results  Component Value Date   WBC 15.1* 07/23/2012   HGB 18.5* 07/23/2012   HCT 57.2* 07/23/2012   MCV 78.2* 07/23/2012   PLT 451* 07/23/2012     Chemistry      Component Value Date/Time   NA 138 05/23/2012 0853   K 5.4* 05/23/2012 0853   CL 101 05/23/2012 0853   CO2 28 05/23/2012 0853   BUN 22 05/23/2012 0853   CREATININE 1.26 05/23/2012 0853      Component Value Date/Time   CALCIUM 9.4 05/23/2012 0853   ALKPHOS 79 05/23/2012 0853   AST 33 05/23/2012 0853   ALT 28 05/23/2012 0853   BILITOT 0.8 05/23/2012 0853        Impression and Plan:  71 year old old with the following issues:  1. Polycythemia differential diagnosis including reactive versus a primary cause such as polycythemia vera. 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 Given his Hgb is up to 18.5, I will start phlebotomies once every 2 months to get his Hgb below 18. I will repeat his counts in 2 months.  2. Thrombosis prophylaxis: He is continued to be anticoagulated with Coumadin at low-dose aspirin.  [name], MD 10/15/201310:21 AM

## 2012-07-23 NOTE — Telephone Encounter (Signed)
Per staff message and POF I have scheduled appt./  JWM  

## 2012-07-23 NOTE — Telephone Encounter (Signed)
Printed and gv pt appt schedule for Dec...the patient aware of Phlebotomy.

## 2012-07-23 NOTE — Patient Instructions (Signed)
Therapeutic Phlebotomy Therapeutic phlebotomy is the controlled removal of blood from your body for the purpose of treating a medical condition. It is similar to donating blood. Usually, about a pint (470 mL) of blood is removed. The average adult has 9 to 12 pints (4.3 to 5.7 L) of blood. Therapeutic phlebotomy may be used to treat the following medical conditions:  Hemochromatosis. This is a condition in which there is too much iron in the blood.  Polycythemia vera. This is a condition in which there are too many red cells in the blood.  Porphyria cutanea tarda. This is a disease usually passed from one generation to the next (inherited). It is a condition in which an important part of hemoglobin is not made properly. This results in the build up of abnormal amounts of porphyrins in the body.  Sickle cell disease. This is an inherited disease. It is a condition in which the red blood cells form an abnormal crescent shape rather than a round shape. LET YOUR CAREGIVER KNOW ABOUT:  Allergies.  Medicines taken including herbs, eyedrops, over-the-counter medicines, and creams.  Use of steroids (by mouth or creams).  Previous problems with anesthetics or numbing medicine.  History of blood clots.  History of bleeding or blood problems.  Previous surgery.  Possibility of pregnancy, if this applies. RISKS AND COMPLICATIONS This is a simple and safe procedure. Problems are unlikely. However, problems can occur and may include:  Nausea or lightheadedness.  Low blood pressure.  Soreness, bleeding, swelling, or bruising at the needle insertion site.  Infection. BEFORE THE PROCEDURE  This is a procedure that can be done as an outpatient. Confirm the time that you need to arrive for your procedure. Confirm whether there is a need to fast or withhold any medications. It is helpful to wear clothing with sleeves that can be raised above the elbow. A blood sample may be done to determine the  amount of red blood cells or iron in your blood. Plan ahead of time to have someone drive you home after the procedure. PROCEDURE The entire procedure from preparation through recovery takes about 1 hour. The actual collection takes about 10 to 15 minutes.  A needle will be inserted into your vein.  Tubing and a collection bag will be attached to that needle.  Blood will flow through the needle and tubing into the collection bag.  You may be asked to open and close your hand slowly and continuously during the entire collection.  Once the specified amount of blood has been removed from your body, the collection bag and tubing will be clamped.  The needle will be removed.  Pressure will be held on the site of the needle insertion to stop the bleeding. Then a bandage will be placed over the needle insertion site. AFTER THE PROCEDURE  Your recovery will be assessed and monitored. If there are no problems, as an outpatient, you should be able to go home shortly after the procedure.  Document Released: 02/27/2011 Document Revised: 12/18/2011 Document Reviewed: 02/27/2011 [employer] Patient Information 2013 [employer].  

## 2012-09-25 ENCOUNTER — Ambulatory Visit (HOSPITAL_BASED_OUTPATIENT_CLINIC_OR_DEPARTMENT_OTHER): Payer: Medicare Other | Admitting: Oncology

## 2012-09-25 ENCOUNTER — Telehealth: Payer: Self-pay | Admitting: Oncology

## 2012-09-25 ENCOUNTER — Ambulatory Visit (HOSPITAL_BASED_OUTPATIENT_CLINIC_OR_DEPARTMENT_OTHER): Payer: Medicare Other

## 2012-09-25 ENCOUNTER — Other Ambulatory Visit (HOSPITAL_BASED_OUTPATIENT_CLINIC_OR_DEPARTMENT_OTHER): Payer: Medicare Other | Admitting: Lab

## 2012-09-25 VITALS — BP 127/82 | HR 49 | Temp 96.7°F | Resp 18 | Ht 71.0 in | Wt 192.0 lb

## 2012-09-25 DIAGNOSIS — Z8546 Personal history of malignant neoplasm of prostate: Secondary | ICD-10-CM

## 2012-09-25 DIAGNOSIS — D45 Polycythemia vera: Secondary | ICD-10-CM

## 2012-09-25 DIAGNOSIS — D751 Secondary polycythemia: Secondary | ICD-10-CM

## 2012-09-25 DIAGNOSIS — Z86711 Personal history of pulmonary embolism: Secondary | ICD-10-CM

## 2012-09-25 DIAGNOSIS — Z7901 Long term (current) use of anticoagulants: Secondary | ICD-10-CM

## 2012-09-25 LAB — CBC WITH DIFFERENTIAL/PLATELET
BASO%: 1.1 % (ref 0.0–2.0)
Basophils Absolute: 0.2 10*3/uL — ABNORMAL HIGH (ref 0.0–0.1)
EOS%: 2 % (ref 0.0–7.0)
HCT: 56.8 % — ABNORMAL HIGH (ref 38.4–49.9)
HGB: 18.6 g/dL — ABNORMAL HIGH (ref 13.0–17.1)
MCH: 24.7 pg — ABNORMAL LOW (ref 27.2–33.4)
MCHC: 32.7 g/dL (ref 32.0–36.0)
MCV: 75.5 fL — ABNORMAL LOW (ref 79.3–98.0)
MONO%: 13.6 % (ref 0.0–14.0)
NEUT%: 69.7 % (ref 39.0–75.0)

## 2012-09-25 LAB — COMPREHENSIVE METABOLIC PANEL (CC13)
Alkaline Phosphatase: 94 U/L (ref 40–150)
BUN: 21 mg/dL (ref 7.0–26.0)
Creatinine: 1.3 mg/dL (ref 0.7–1.3)
Glucose: 67 mg/dl — ABNORMAL LOW (ref 70–99)
Total Bilirubin: 0.67 mg/dL (ref 0.20–1.20)

## 2012-09-25 NOTE — Telephone Encounter (Signed)
appts made and printed for pt pt aware that phlebot will be added

## 2012-09-25 NOTE — Progress Notes (Signed)
1000 VSS. Phlebotomy completed with 520g removed using phlebotomy kit. Patient tolerated treatment well. Orange juice and snack provided. Patient to stay for 30 minutes post observation.  1035 30 minute post phlebotomy completed. VSS. Patient with no complaints. Discharged ambulating and AVS provided.

## 2012-09-25 NOTE — Telephone Encounter (Signed)
CALLED AND LM WITH [facility]PT[facility] FOR PHLEBOT

## 2012-09-25 NOTE — Patient Instructions (Signed)
Therapeutic Phlebotomy Therapeutic phlebotomy is the controlled removal of blood from your body for the purpose of treating a medical condition. It is similar to donating blood. Usually, about a pint (470 mL) of blood is removed. The average adult has 9 to 12 pints (4.3 to 5.7 L) of blood. Therapeutic phlebotomy may be used to treat the following medical conditions:  Hemochromatosis. This is a condition in which there is too much iron in the blood.  Polycythemia vera. This is a condition in which there are too many red cells in the blood.  Porphyria cutanea tarda. This is a disease usually passed from one generation to the next (inherited). It is a condition in which an important part of hemoglobin is not made properly. This results in the build up of abnormal amounts of porphyrins in the body.  Sickle cell disease. This is an inherited disease. It is a condition in which the red blood cells form an abnormal crescent shape rather than a round shape. LET YOUR CAREGIVER KNOW ABOUT:  Allergies.  Medicines taken including herbs, eyedrops, over-the-counter medicines, and creams.  Use of steroids (by mouth or creams).  Previous problems with anesthetics or numbing medicine.  History of blood clots.  History of bleeding or blood problems.  Previous surgery.  Possibility of pregnancy, if this applies. RISKS AND COMPLICATIONS This is a simple and safe procedure. Problems are unlikely. However, problems can occur and may include:  Nausea or lightheadedness.  Low blood pressure.  Soreness, bleeding, swelling, or bruising at the needle insertion site.  Infection. BEFORE THE PROCEDURE  This is a procedure that can be done as an outpatient. Confirm the time that you need to arrive for your procedure. Confirm whether there is a need to fast or withhold any medications. It is helpful to wear clothing with sleeves that can be raised above the elbow. A blood sample may be done to determine the  amount of red blood cells or iron in your blood. Plan ahead of time to have someone drive you home after the procedure. PROCEDURE The entire procedure from preparation through recovery takes about 1 hour. The actual collection takes about 10 to 15 minutes.  A needle will be inserted into your vein.  Tubing and a collection bag will be attached to that needle.  Blood will flow through the needle and tubing into the collection bag.  You may be asked to open and close your hand slowly and continuously during the entire collection.  Once the specified amount of blood has been removed from your body, the collection bag and tubing will be clamped.  The needle will be removed.  Pressure will be held on the site of the needle insertion to stop the bleeding. Then a bandage will be placed over the needle insertion site. AFTER THE PROCEDURE  Your recovery will be assessed and monitored. If there are no problems, as an outpatient, you should be able to go home shortly after the procedure.  Document Released: 02/27/2011 Document Revised: 12/18/2011 Document Reviewed: 02/27/2011 [employer] Patient Information 2013 [employer].  

## 2012-09-25 NOTE — Progress Notes (Signed)
Hematology and Oncology Follow Up Visit  Gregory Blankenship [id number] 23-Oct-1940 71 y.o. 09/25/2012 9:16 AM   Principle Diagnosis: 71 year old gentleman with polycythemia diagnosed February of 2013 presented with a hemoglobin of 18. The differential diagnosis is reactive versus primary polycythemia. He is JAK-2 positive.  Secondary diagnosis: History of prostate cancer diagnosed in 2011. He is also history of coronary arteries disease had a pulmonary embolus.  Interim History: Patient presents today for a followup visit. He is a man with elevated Hgb and WBC. The differential diagnosis would include reactive polycythemia related to his recent cardiac event as well as recent hospitalization. His workup so far have been unremarkable including CT scan of the abdomen and pelvis. He did have a JAK 2 positive mutation. After 2 phlebotomies in the last 2 months, he is doing well. Clinically he is feeling a lot better with no recent complications. He tolerated phlebotomy 2 months ago with some improvement in his overall energy.  He finished cardiac rehabilitation and had not had any other events. He had not had any bleeding complications he had not had any thrombotic events she is continued to be anticoagulated with Coumadin and aspirin. He is very active and exercise regularly. He is not reporting any headaches, chest pain or difficulty breathing.   Medications: I have reviewed the patient's current medications. Current outpatient prescriptions:acetaminophen (TYLENOL) 325 MG tablet, Take 2 tablets (650 mg total) by mouth every 4 (four) hours as needed., Disp: , Rfl: ;  aspirin 81 MG chewable tablet, Chew 1 tablet (81 mg total) by mouth daily., Disp: , Rfl: ;  carvedilol (COREG) 6.25 MG tablet, Take 6.25 mg by mouth 2 (two) times daily with a meal., Disp: , Rfl: ;  isosorbide mononitrate (IMDUR) 30 MG 24 hr tablet, Take 30 mg by mouth daily., Disp: , Rfl:  levothyroxine (SYNTHROID, LEVOTHROID) 88 MCG tablet, Take  88 mcg by mouth daily., Disp: , Rfl: ;  lisinopril (PRINIVIL,ZESTRIL) 5 MG tablet, Take 1 tablet (5 mg total) by mouth daily., Disp: 30 tablet, Rfl: 5;  nitroGLYCERIN (NITROSTAT) 0.4 MG SL tablet, Place 1 tablet (0.4 mg total) under the tongue every 5 (five) minutes as needed for chest pain., Disp: 25 tablet, Rfl: 2 simvastatin (ZOCOR) 20 MG tablet, Take 20 mg by mouth every evening. Managed by PCP, Dr. Nehemiah Settle, Disp: , Rfl: ;  warfarin (COUMADIN) 5 MG tablet, Take 5 mg by mouth daily. Managed by PCP, Dr. Nehemiah Settle, Disp: , Rfl:   Allergies:  Allergies  Allergen Reactions  . Erythromycin Nausea Only    Past Medical History, Surgical history, Social history, and Family History were reviewed and updated.  Review of Systems: Constitutional:  Negative for fever, chills, night sweats, anorexia, weight loss, pain. Cardiovascular: no chest pain or dyspnea on exertion Respiratory: no cough, shortness of breath, or wheezing Neurological: no TIA or stroke symptoms Dermatological: negative ENT: negative Skin: Negative. Gastrointestinal: no abdominal pain, change in bowel habits, or black or bloody stools Genito-Urinary: no dysuria, trouble voiding, or hematuria Hematological and Lymphatic: negative Breast: negative Musculoskeletal: negative Remaining ROS negative. Physical Exam: Blood pressure 127/82, pulse 49, temperature 96.7 F (35.9 C), temperature source Oral, resp. rate 18, height 5\' 11"  (1.803 m), weight 192 lb (87.091 kg). ECOG: 1 General appearance: Alert and oriented and appears in no active distress. Head: Normocephalic, atraumatic. Pupils equal, round, reactive to light. Oral mucosa moist and pink.  Neck: Supple without adenopathy.  Heart: Regular rate and rhythm. S1, S2.  Lungs: Clear to auscultation.  No rhonchi, wheeze, no dullness to percussion.  Abdomen: Soft, nontender. No hepatosplenomegaly.  Extremities: No clubbing, cyanosis, or edema.  Neurologic: Intact motor, sensory and  deep tendon reflexes.   Lab Results: Lab Results  Component Value Date   WBC 14.7* 09/25/2012   HGB 18.6* 09/25/2012   HCT 56.8* 09/25/2012   MCV 75.5* 09/25/2012   PLT 407* 09/25/2012     Chemistry      Component Value Date/Time   NA 136 07/23/2012 0938   NA 138 05/23/2012 0853   K 5.2* 07/23/2012 0938   K 5.4* 05/23/2012 0853   CL 103 07/23/2012 0938   CL 101 05/23/2012 0853   CO2 25 07/23/2012 0938   CO2 28 05/23/2012 0853   BUN 23.0 07/23/2012 0938   BUN 22 05/23/2012 0853   CREATININE 1.2 07/23/2012 0938   CREATININE 1.26 05/23/2012 0853      Component Value Date/Time   CALCIUM 9.7 07/23/2012 0938   CALCIUM 9.4 05/23/2012 0853   ALKPHOS 95 07/23/2012 0938   ALKPHOS 79 05/23/2012 0853   AST 29 07/23/2012 0938   AST 33 05/23/2012 0853   ALT 29 07/23/2012 0938   ALT 28 05/23/2012 0853   BILITOT 0.80 07/23/2012 0938   BILITOT 0.8 05/23/2012 0853        Impression and Plan:  71 year old old with the following issues:  1. Polycythemia differential diagnosis including reactive versus a primary cause such as polycythemia vera. 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 Given his Hgb is up to 18.6, I phlebotomies once today and in 2 weeks to get his  Hgb below 18. He is traveling outside the country in [place]2012 and I will see him after his return.   2. Thrombosis prophylaxis: He is continued to be anticoagulated with Coumadin at low-dose aspirin.  [facility], MD 12/18/20139:16 AM

## 2012-10-09 DIAGNOSIS — I219 Acute myocardial infarction, unspecified: Secondary | ICD-10-CM

## 2012-10-09 HISTORY — PX: CARDIAC CATHETERIZATION: SHX172

## 2012-10-09 HISTORY — DX: Acute myocardial infarction, unspecified: I21.9

## 2012-10-10 ENCOUNTER — Ambulatory Visit (HOSPITAL_BASED_OUTPATIENT_CLINIC_OR_DEPARTMENT_OTHER): Payer: Medicare Other

## 2012-10-10 ENCOUNTER — Other Ambulatory Visit (HOSPITAL_BASED_OUTPATIENT_CLINIC_OR_DEPARTMENT_OTHER): Payer: Medicare Other | Admitting: Lab

## 2012-10-10 DIAGNOSIS — D751 Secondary polycythemia: Secondary | ICD-10-CM

## 2012-10-10 DIAGNOSIS — D45 Polycythemia vera: Secondary | ICD-10-CM

## 2012-10-10 LAB — CBC WITH DIFFERENTIAL/PLATELET
Basophils Absolute: 0.1 10*3/uL (ref 0.0–0.1)
Eosinophils Absolute: 0.4 10*3/uL (ref 0.0–0.5)
HCT: 54.2 % — ABNORMAL HIGH (ref 38.4–49.9)
HGB: 17.5 g/dL — ABNORMAL HIGH (ref 13.0–17.1)
LYMPH%: 11.3 % — ABNORMAL LOW (ref 14.0–49.0)
MONO#: 1.5 10*3/uL — ABNORMAL HIGH (ref 0.1–0.9)
NEUT#: 8.8 10*3/uL — ABNORMAL HIGH (ref 1.5–6.5)
NEUT%: 72.6 % (ref 39.0–75.0)
Platelets: 422 10*3/uL — ABNORMAL HIGH (ref 140–400)
RBC: 7.08 10*6/uL — ABNORMAL HIGH (ref 4.20–5.82)
WBC: 12.2 10*3/uL — ABNORMAL HIGH (ref 4.0–10.3)

## 2012-10-10 NOTE — Progress Notes (Signed)
0935-Pt.'s HGB 17.5 today.  Pt is without complaints.  No need for phlebotomy today per Dr. Clelia Croft.  Pt understands to return as scheduled on 12/06/12 per Dr. Clelia Croft.

## 2012-11-27 ENCOUNTER — Other Ambulatory Visit: Payer: Self-pay | Admitting: Oncology

## 2012-12-06 ENCOUNTER — Ambulatory Visit: Payer: Medicare Other

## 2012-12-06 ENCOUNTER — Other Ambulatory Visit (HOSPITAL_BASED_OUTPATIENT_CLINIC_OR_DEPARTMENT_OTHER): Payer: Medicare Other | Admitting: Lab

## 2012-12-06 ENCOUNTER — Telehealth: Payer: Self-pay | Admitting: Oncology

## 2012-12-06 ENCOUNTER — Ambulatory Visit (HOSPITAL_BASED_OUTPATIENT_CLINIC_OR_DEPARTMENT_OTHER): Payer: Medicare Other | Admitting: Oncology

## 2012-12-06 VITALS — BP 122/74 | HR 50 | Temp 96.8°F | Resp 20 | Ht 71.0 in | Wt 192.5 lb

## 2012-12-06 DIAGNOSIS — D751 Secondary polycythemia: Secondary | ICD-10-CM

## 2012-12-06 DIAGNOSIS — D45 Polycythemia vera: Secondary | ICD-10-CM

## 2012-12-06 DIAGNOSIS — I2699 Other pulmonary embolism without acute cor pulmonale: Secondary | ICD-10-CM

## 2012-12-06 DIAGNOSIS — Z7901 Long term (current) use of anticoagulants: Secondary | ICD-10-CM

## 2012-12-06 LAB — CBC WITH DIFFERENTIAL/PLATELET
BASO%: 0.8 % (ref 0.0–2.0)
HCT: 55 % — ABNORMAL HIGH (ref 38.4–49.9)
LYMPH%: 10.3 % — ABNORMAL LOW (ref 14.0–49.0)
MCHC: 31.8 g/dL — ABNORMAL LOW (ref 32.0–36.0)
MONO#: 2.7 10*3/uL — ABNORMAL HIGH (ref 0.1–0.9)
NEUT%: 72.6 % (ref 39.0–75.0)
Platelets: 491 10*3/uL — ABNORMAL HIGH (ref 140–400)
WBC: 20.3 10*3/uL — ABNORMAL HIGH (ref 4.0–10.3)

## 2012-12-06 NOTE — Telephone Encounter (Signed)
GAve pt appt for lab and MD on 02/05/13

## 2012-12-06 NOTE — Progress Notes (Signed)
Hematology and Oncology Follow Up Visit  Gregory Blankenship [id number] 1941/07/14 72 y.o. 12/06/2012 9:42 AM   Principle Diagnosis: 72 year old gentleman with polycythemia diagnosed February of 2013 presented with a hemoglobin of 18. 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   Secondary diagnosis: History of prostate cancer diagnosed in 2011. He is also history of coronary arteries disease had a pulmonary embolus.  Interim History: Patient presents today for a followup visit. He is a man with elevated Hgb and WBC. His workup so far have been unremarkable including CT scan of the abdomen and pelvis. He did have a JAK 2 positive mutation. After 2 phlebotomies in the last 2 months, he is doing well without any major changes in his clinical status. Clinically he is feeling a lot better with no recent complications. He had not had any bleeding complications he had not had any thrombotic events she is continued to be anticoagulated with Coumadin and aspirin. He is very active and exercise regularly. He is not reporting any headaches, chest pain or difficulty breathing. He recently traveled to [place] without complications or issues.   Medications: I have reviewed the patient's current medications. Current outpatient prescriptions:acetaminophen (TYLENOL) 325 MG tablet, Take 650 mg by mouth every 6 (six) hours as needed for pain., Disp: , Rfl: ;  aspirin 81 MG tablet, Take 81 mg by mouth daily., Disp: , Rfl: ;  carvedilol (COREG) 6.25 MG tablet, Take 6.25 mg by mouth 2 (two) times daily with a meal., Disp: , Rfl: ;  isosorbide mononitrate (IMDUR) 30 MG 24 hr tablet, Take 30 mg by mouth daily., Disp: , Rfl:  levothyroxine (SYNTHROID, LEVOTHROID) 88 MCG tablet, Take 88 mcg by mouth daily., Disp: , Rfl: ;  lisinopril (PRINIVIL,ZESTRIL) 5 MG tablet, , Disp: , Rfl: ;  nitroGLYCERIN (NITROSTAT) 0.4 MG SL tablet, Place 0.4 mg under the tongue every 5 (five) minutes as needed for chest pain., Disp: , Rfl: ;   simvastatin (ZOCOR) 20 MG tablet, Take 20 mg by mouth every evening. Managed by PCP, Dr. Nehemiah Settle, Disp: , Rfl:  warfarin (COUMADIN) 5 MG tablet, Take 5 mg by mouth daily. Managed by PCP, Dr. Nehemiah Settle, Disp: , Rfl: ;  lisinopril (PRINIVIL,ZESTRIL) 5 MG tablet, Take 1 tablet (5 mg total) by mouth daily., Disp: 30 tablet, Rfl: 5;  nitroGLYCERIN (NITROSTAT) 0.4 MG SL tablet, Place 1 tablet (0.4 mg total) under the tongue every 5 (five) minutes as needed for chest pain., Disp: 25 tablet, Rfl: 2  Allergies:  Allergies  Allergen Reactions  . Erythromycin Nausea Only    Past Medical History, Surgical history, Social history, and Family History were reviewed and updated.  Review of Systems: Constitutional:  Negative for fever, chills, night sweats, anorexia, weight loss, pain. Cardiovascular: no chest pain or dyspnea on exertion Respiratory: no cough, shortness of breath, or wheezing Neurological: no TIA or stroke symptoms Dermatological: negative ENT: negative Skin: Negative. Gastrointestinal: no abdominal pain, change in bowel habits, or black or bloody stools Genito-Urinary: no dysuria, trouble voiding, or hematuria Hematological and Lymphatic: negative Breast: negative Musculoskeletal: negative Remaining ROS negative. Physical Exam: Blood pressure 122/74, pulse 50, temperature 96.8 F (36 C), temperature source Oral, resp. rate 20, height 5\' 11"  (1.803 m), weight 192 lb 8 oz (87.317 kg). ECOG: 1 General appearance: Alert and oriented and appears in no active distress. Head: Normocephalic, atraumatic. Pupils equal, round, reactive to light. Oral mucosa moist and pink.  Neck: Supple without adenopathy.  Heart: Regular rate and rhythm. S1, S2.  Lungs: Clear to auscultation. No rhonchi, wheeze, no dullness to percussion.  Abdomen: Soft, nontender. No hepatosplenomegaly.  Extremities: No clubbing, cyanosis, or edema.  Neurologic: Intact motor, sensory and deep tendon reflexes.   Lab  Results: Lab Results  Component Value Date   WBC 20.3* 12/06/2012   HGB 17.5* 12/06/2012   HCT 55.0* 12/06/2012   MCV 73.7* 12/06/2012   PLT 491* 12/06/2012     Chemistry      Component Value Date/Time   NA 143 09/25/2012 0824   NA 138 05/23/2012 0853   K 5.5* 09/25/2012 0824   K 5.4* 05/23/2012 0853   CL 102 09/25/2012 0824   CL 101 05/23/2012 0853   CO2 28 09/25/2012 0824   CO2 28 05/23/2012 0853   BUN 21.0 09/25/2012 0824   BUN 22 05/23/2012 0853   CREATININE 1.3 09/25/2012 0824   CREATININE 1.26 05/23/2012 0853      Component Value Date/Time   CALCIUM 9.3 09/25/2012 0824   CALCIUM 9.4 05/23/2012 0853   ALKPHOS 94 09/25/2012 0824   ALKPHOS 79 05/23/2012 0853   AST 30 09/25/2012 0824   AST 33 05/23/2012 0853   ALT 31 09/25/2012 0824   ALT 28 05/23/2012 0853   BILITOT 0.67 09/25/2012 0824   BILITOT 0.8 05/23/2012 0853        Impression and Plan:  72 year old old with the following issues:  1. 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 His Hgb is stable and symptomatic. I gave him the option of phlebotomy vs hydrea vs observation. For now, we will continue observation and consider intervention with higher counts.    2. Thrombosis prophylaxis: He is continued to be anticoagulated with Coumadin at low-dose aspirin.  [facility], MD 2/28/20149:42 AM

## 2012-12-06 NOTE — Progress Notes (Signed)
Per Corrie Dandy, RN- pt does not need phlebotomy today.

## 2012-12-10 ENCOUNTER — Other Ambulatory Visit: Payer: Self-pay | Admitting: Dermatology

## 2013-02-05 ENCOUNTER — Other Ambulatory Visit (HOSPITAL_BASED_OUTPATIENT_CLINIC_OR_DEPARTMENT_OTHER): Payer: Medicare Other | Admitting: Lab

## 2013-02-05 ENCOUNTER — Ambulatory Visit (HOSPITAL_BASED_OUTPATIENT_CLINIC_OR_DEPARTMENT_OTHER): Payer: Medicare Other | Admitting: Oncology

## 2013-02-05 ENCOUNTER — Telehealth: Payer: Self-pay | Admitting: Oncology

## 2013-02-05 VITALS — BP 109/69 | HR 56 | Temp 97.0°F | Resp 18 | Ht 71.0 in | Wt 196.3 lb

## 2013-02-05 DIAGNOSIS — D45 Polycythemia vera: Secondary | ICD-10-CM

## 2013-02-05 DIAGNOSIS — D751 Secondary polycythemia: Secondary | ICD-10-CM

## 2013-02-05 DIAGNOSIS — C61 Malignant neoplasm of prostate: Secondary | ICD-10-CM

## 2013-02-05 LAB — CBC WITH DIFFERENTIAL/PLATELET
EOS%: 2.6 % (ref 0.0–7.0)
MCH: 24.3 pg — ABNORMAL LOW (ref 27.2–33.4)
MCV: 73.7 fL — ABNORMAL LOW (ref 79.3–98.0)
MONO%: 12.8 % (ref 0.0–14.0)
RBC: 7.29 10*6/uL — ABNORMAL HIGH (ref 4.20–5.82)
RDW: 17.7 % — ABNORMAL HIGH (ref 11.0–14.6)
nRBC: 0 % (ref 0–0)

## 2013-02-05 LAB — COMPREHENSIVE METABOLIC PANEL (CC13)
ALT: 19 U/L (ref 0–55)
Alkaline Phosphatase: 94 U/L (ref 40–150)
Sodium: 138 mEq/L (ref 136–145)
Total Bilirubin: 0.53 mg/dL (ref 0.20–1.20)
Total Protein: 7 g/dL (ref 6.4–8.3)

## 2013-02-05 NOTE — Progress Notes (Signed)
Hematology and Oncology Follow Up Visit  Gregory Blankenship [id number] Sep 03, 1941 72 y.o. 02/05/2013 3:05 PM   Principle Diagnosis: 72 year old gentleman with polycythemia diagnosed February of 2013 presented with a hemoglobin of 18. 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  Secondary diagnosis: History of prostate cancer diagnosed in 2011. He is also history of coronary arteries disease had a pulmonary embolus.  Interim History: Patient presents today for a followup visit. He is a man with elevated Hgb and WBC. 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 He is doing well without any major changes in his clinical status. Clinically he is feeling a lot better with no recent complications. He had not had any bleeding complications he had not had any thrombotic events she is continued to be anticoagulated with Coumadin and aspirin. He is very active and exercise regularly. He is not reporting any headaches, chest pain or difficulty breathing. He recently traveled to [place] without complications or issues.   Medications: I have reviewed the patient's current medications. Current outpatient prescriptions:acetaminophen (TYLENOL) 325 MG tablet, Take 650 mg by mouth every 6 (six) hours as needed for pain., Disp: , Rfl: ;  aspirin 81 MG tablet, Take 81 mg by mouth daily., Disp: , Rfl: ;  carvedilol (COREG) 6.25 MG tablet, Take 6.25 mg by mouth 2 (two) times daily with a meal., Disp: , Rfl: ;  isosorbide mononitrate (IMDUR) 30 MG 24 hr tablet, Take 30 mg by mouth daily., Disp: , Rfl:  levothyroxine (SYNTHROID, LEVOTHROID) 88 MCG tablet, Take 88 mcg by mouth daily., Disp: , Rfl: ;  lisinopril (PRINIVIL,ZESTRIL) 5 MG tablet, , Disp: , Rfl: ;  nitroGLYCERIN (NITROSTAT) 0.4 MG SL tablet, Place 0.4 mg under the tongue every 5 (five) minutes as needed for chest pain., Disp: , Rfl: ;  simvastatin (ZOCOR) 20 MG tablet, Take 20 mg by mouth every evening. Managed by PCP, Dr. Nehemiah Settle, Disp: , Rfl:   warfarin (COUMADIN) 5 MG tablet, Take 5 mg by mouth daily. Managed by PCP, Dr. Nehemiah Settle, Disp: , Rfl: ;  lisinopril (PRINIVIL,ZESTRIL) 5 MG tablet, Take 1 tablet (5 mg total) by mouth daily., Disp: 30 tablet, Rfl: 5;  nitroGLYCERIN (NITROSTAT) 0.4 MG SL tablet, Place 1 tablet (0.4 mg total) under the tongue every 5 (five) minutes as needed for chest pain., Disp: 25 tablet, Rfl: 2  Allergies:  Allergies  Allergen Reactions  . Erythromycin Nausea Only    Past Medical History, Surgical history, Social history, and Family History were reviewed and updated.  Review of Systems: Constitutional:  Negative for fever, chills, night sweats, anorexia, weight loss, pain. Cardiovascular: no chest pain or dyspnea on exertion Respiratory: no cough, shortness of breath, or wheezing Neurological: no TIA or stroke symptoms Dermatological: negative ENT: negative Skin: Negative. Gastrointestinal: no abdominal pain, change in bowel habits, or black or bloody stools Genito-Urinary: no dysuria, trouble voiding, or hematuria Hematological and Lymphatic: negative Breast: negative Musculoskeletal: negative Remaining ROS negative. Physical Exam: Blood pressure 109/69, pulse 56, temperature 97 F (36.1 C), temperature source Oral, resp. rate 18, height 5\' 11"  (1.803 m), weight 196 lb 4.8 oz (89.041 kg). ECOG: 1 General appearance: Alert and oriented and appears in no active distress. Head: Normocephalic, atraumatic. Pupils equal, round, reactive to light. Oral mucosa moist and pink.  Neck: Supple without adenopathy.  Heart: Regular rate and rhythm. S1, S2.  Lungs: Clear to auscultation. No rhonchi, wheeze, no dullness to percussion.  Abdomen: Soft, nontender. No hepatosplenomegaly.  Extremities: No clubbing, cyanosis,  or edema.  Neurologic: Intact motor, sensory and deep tendon reflexes.   Lab Results: Lab Results  Component Value Date   WBC 17.2* 02/05/2013   HGB 17.7* 02/05/2013   HCT 53.7* 02/05/2013    MCV 73.7* 02/05/2013   PLT 465* 02/05/2013     Chemistry      Component Value Date/Time   NA 143 09/25/2012 0824   NA 138 05/23/2012 0853   K 5.5* 09/25/2012 0824   K 5.4* 05/23/2012 0853   CL 102 09/25/2012 0824   CL 101 05/23/2012 0853   CO2 28 09/25/2012 0824   CO2 28 05/23/2012 0853   BUN 21.0 09/25/2012 0824   BUN 22 05/23/2012 0853   CREATININE 1.3 09/25/2012 0824   CREATININE 1.26 05/23/2012 0853      Component Value Date/Time   CALCIUM 9.3 09/25/2012 0824   CALCIUM 9.4 05/23/2012 0853   ALKPHOS 94 09/25/2012 0824   ALKPHOS 79 05/23/2012 0853   AST 30 09/25/2012 0824   AST 33 05/23/2012 0853   ALT 31 09/25/2012 0824   ALT 28 05/23/2012 0853   BILITOT 0.67 09/25/2012 0824   BILITOT 0.8 05/23/2012 0853        Impression and Plan:  71 year old old with the following issues:  1. 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 His Hgb is stable and he is asymptomatic. I gave him the option of phlebotomy vs hydrea vs observation. For now, we will continue observation and consider intervention with higher counts. We discussed possible phlebotomy if his Hgb above 18.    2. Thrombosis prophylaxis: He is continued to be anticoagulated with Coumadin at low-dose aspirin.  [facility], MD 4/30/20143:05 PM

## 2013-02-19 ENCOUNTER — Other Ambulatory Visit: Payer: Self-pay | Admitting: Gastroenterology

## 2013-02-26 ENCOUNTER — Other Ambulatory Visit: Payer: Self-pay | Admitting: Cardiovascular Disease

## 2013-04-14 ENCOUNTER — Other Ambulatory Visit: Payer: Self-pay | Admitting: *Deleted

## 2013-04-15 ENCOUNTER — Other Ambulatory Visit (HOSPITAL_BASED_OUTPATIENT_CLINIC_OR_DEPARTMENT_OTHER): Payer: Medicare Other | Admitting: Lab

## 2013-04-15 ENCOUNTER — Telehealth: Payer: Self-pay | Admitting: Oncology

## 2013-04-15 ENCOUNTER — Ambulatory Visit (HOSPITAL_BASED_OUTPATIENT_CLINIC_OR_DEPARTMENT_OTHER): Payer: Medicare Other | Admitting: Oncology

## 2013-04-15 VITALS — BP 101/61 | HR 53 | Temp 97.0°F | Resp 18 | Ht 71.0 in | Wt 194.6 lb

## 2013-04-15 DIAGNOSIS — D751 Secondary polycythemia: Secondary | ICD-10-CM

## 2013-04-15 DIAGNOSIS — D45 Polycythemia vera: Secondary | ICD-10-CM

## 2013-04-15 LAB — CBC WITH DIFFERENTIAL/PLATELET
BASO%: 0.6 % (ref 0.0–2.0)
EOS%: 2.1 % (ref 0.0–7.0)
LYMPH%: 11.3 % — ABNORMAL LOW (ref 14.0–49.0)
MCHC: 32.8 g/dL (ref 32.0–36.0)
MONO#: 3.1 10*3/uL — ABNORMAL HIGH (ref 0.1–0.9)
MONO%: 15.8 % — ABNORMAL HIGH (ref 0.0–14.0)
Platelets: 461 10*3/uL — ABNORMAL HIGH (ref 140–400)
RBC: 7.32 10*6/uL — ABNORMAL HIGH (ref 4.20–5.82)
WBC: 19.6 10*3/uL — ABNORMAL HIGH (ref 4.0–10.3)

## 2013-04-15 LAB — COMPREHENSIVE METABOLIC PANEL (CC13)
ALT: 19 U/L (ref 0–55)
AST: 26 U/L (ref 5–34)
Alkaline Phosphatase: 78 U/L (ref 40–150)
CO2: 24 mEq/L (ref 22–29)
Sodium: 134 mEq/L — ABNORMAL LOW (ref 136–145)
Total Bilirubin: 0.67 mg/dL (ref 0.20–1.20)
Total Protein: 6.8 g/dL (ref 6.4–8.3)

## 2013-04-15 NOTE — Progress Notes (Signed)
Hematology and Oncology Follow Up Visit  Gregory Blankenship [id number] 23-Feb-1941 72 y.o. 04/15/2013 3:14 PM   Principle Diagnosis: 72 year old gentleman with polycythemia diagnosed February of 2013 presented with a hemoglobin of 18. 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  Secondary diagnosis: History of prostate cancer diagnosed in 2011. He is also history of coronary arteries disease had a pulmonary embolus.  Current therapy: Observation and surveillance under evaluation for possible phlebotomy.  Interim History: Gregory Blankenship presents today for a followup visit. He is a pleasant man with elevated Hgb and WBC. 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 He is doing well without any major changes in his clinical status. Clinically he is feeling a lot better with no recent complications. He had not had any bleeding complications he had not had any thrombotic events she is continued to be anticoagulated with Coumadin and aspirin. He is very active and exercise regularly. He is not reporting any headaches, chest pain or difficulty breathing. He is planning another trip to [place] in the near future.   Medications: I have reviewed the patient's current medications. Current Outpatient Prescriptions  Medication Sig Dispense Refill  . acetaminophen (TYLENOL) 325 MG tablet Take 650 mg by mouth every 6 (six) hours as needed for pain.      Marland Kitchen aspirin 81 MG tablet Take 81 mg by mouth daily.      . carvedilol (COREG) 6.25 MG tablet TAKE ONE TABLET BY MOUTH TWICE DAILY  60 tablet  6  . isosorbide mononitrate (IMDUR) 30 MG 24 hr tablet Take 30 mg by mouth daily.      Marland Kitchen levothyroxine (SYNTHROID, LEVOTHROID) 88 MCG tablet Take 88 mcg by mouth daily.      Marland Kitchen lisinopril (PRINIVIL,ZESTRIL) 5 MG tablet       . nitroGLYCERIN (NITROSTAT) 0.4 MG SL tablet Place 0.4 mg under the tongue every 5 (five) minutes as needed for chest pain.      . simvastatin (ZOCOR) 20 MG tablet Take 20 mg by mouth every  evening. Managed by PCP, Dr. Nehemiah Settle      . warfarin (COUMADIN) 5 MG tablet Take 5 mg by mouth daily. Managed by PCP, Dr. Nehemiah Settle      . lisinopril (PRINIVIL,ZESTRIL) 5 MG tablet Take 1 tablet (5 mg total) by mouth daily.  30 tablet  5   No current facility-administered medications for this visit.    Allergies:  Allergies  Allergen Reactions  . Erythromycin Nausea Only    Past Medical History, Surgical history, Social history, and Family History were reviewed and updated.  Review of Systems: Constitutional:  Negative for fever, chills, night sweats, anorexia, weight loss, pain. Cardiovascular: no chest pain or dyspnea on exertion Respiratory: no cough, shortness of breath, or wheezing Neurological: no TIA or stroke symptoms Dermatological: negative ENT: negative Skin: Negative. Gastrointestinal: no abdominal pain, change in bowel habits, or black or bloody stools Genito-Urinary: no dysuria, trouble voiding, or hematuria Hematological and Lymphatic: negative Breast: negative Musculoskeletal: negative Remaining ROS negative. Physical Exam: Blood pressure 101/61, pulse 53, temperature 97 F (36.1 C), temperature source Oral, resp. rate 18, height 5\' 11"  (1.803 m), weight 194 lb 9.6 oz (88.27 kg). ECOG: 1 General appearance: Alert and oriented and appears in no active distress. Head: Normocephalic, atraumatic. Pupils equal, round, reactive to light. Oral mucosa moist and pink.  Neck: Supple without adenopathy.  Heart: Regular rate and rhythm. S1, S2.  Lungs: Clear to auscultation. No rhonchi, wheeze, no dullness to percussion.  Abdomen: Soft, nontender. No hepatosplenomegaly.  Extremities: No clubbing, cyanosis, or edema.  Neurologic: Intact motor, sensory and deep tendon reflexes.   Lab Results: Lab Results  Component Value Date   WBC 19.6* 04/15/2013   HGB 17.2* 04/15/2013   HCT 52.6* 04/15/2013   MCV 71.9* 04/15/2013   PLT 461* 04/15/2013     Chemistry      Component Value  Date/Time   NA 138 02/05/2013 1429   NA 138 05/23/2012 0853   K 4.5 02/05/2013 1429   K 5.4* 05/23/2012 0853   CL 102 02/05/2013 1429   CL 101 05/23/2012 0853   CO2 27 02/05/2013 1429   CO2 28 05/23/2012 0853   BUN 22.9 02/05/2013 1429   BUN 22 05/23/2012 0853   CREATININE 1.4* 02/05/2013 1429   CREATININE 1.26 05/23/2012 0853      Component Value Date/Time   CALCIUM 9.1 02/05/2013 1429   CALCIUM 9.4 05/23/2012 0853   ALKPHOS 94 02/05/2013 1429   ALKPHOS 79 05/23/2012 0853   AST 25 02/05/2013 1429   AST 33 05/23/2012 0853   ALT 19 02/05/2013 1429   ALT 28 05/23/2012 0853   BILITOT 0.53 02/05/2013 1429   BILITOT 0.8 05/23/2012 0853      Impression and Plan:  72 year old old with the following issues:  1. 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 His Hgb is stable and he is asymptomatic. I gave him the option of phlebotomy vs hydrea vs observation. For now, we will continue observation and consider intervention with higher counts. We discussed possible phlebotomy if his Hgb above 18.  I will recheck him in 3 months.   2. Thrombosis prophylaxis: He is continued to be anticoagulated with Coumadin at low-dose aspirin.  [facility], MD 7/8/20143:14 PM

## 2013-04-15 NOTE — Telephone Encounter (Signed)
gv and printed appt sched and avs for pt  °

## 2013-06-23 ENCOUNTER — Other Ambulatory Visit: Payer: Self-pay | Admitting: Dermatology

## 2013-07-04 ENCOUNTER — Other Ambulatory Visit: Payer: Self-pay | Admitting: Cardiovascular Disease

## 2013-07-04 ENCOUNTER — Other Ambulatory Visit: Payer: Self-pay | Admitting: Cardiology

## 2013-07-04 NOTE — Telephone Encounter (Signed)
Rx was sent to pharmacy electronically. 

## 2013-07-16 ENCOUNTER — Ambulatory Visit (HOSPITAL_BASED_OUTPATIENT_CLINIC_OR_DEPARTMENT_OTHER): Payer: Medicare Other

## 2013-07-16 ENCOUNTER — Telehealth: Payer: Self-pay | Admitting: Oncology

## 2013-07-16 ENCOUNTER — Telehealth: Payer: Self-pay | Admitting: *Deleted

## 2013-07-16 ENCOUNTER — Ambulatory Visit (HOSPITAL_BASED_OUTPATIENT_CLINIC_OR_DEPARTMENT_OTHER): Payer: Medicare Other | Admitting: Oncology

## 2013-07-16 ENCOUNTER — Encounter (INDEPENDENT_AMBULATORY_CARE_PROVIDER_SITE_OTHER): Payer: Self-pay

## 2013-07-16 ENCOUNTER — Other Ambulatory Visit (HOSPITAL_BASED_OUTPATIENT_CLINIC_OR_DEPARTMENT_OTHER): Payer: Medicare Other | Admitting: Lab

## 2013-07-16 VITALS — BP 100/74 | HR 57 | Temp 97.0°F | Resp 18 | Ht 71.0 in | Wt 190.8 lb

## 2013-07-16 DIAGNOSIS — D45 Polycythemia vera: Secondary | ICD-10-CM

## 2013-07-16 DIAGNOSIS — D751 Secondary polycythemia: Secondary | ICD-10-CM

## 2013-07-16 LAB — COMPREHENSIVE METABOLIC PANEL (CC13)
AST: 24 U/L (ref 5–34)
Albumin: 3.7 g/dL (ref 3.5–5.0)
Anion Gap: 11 mEq/L (ref 3–11)
BUN: 20.7 mg/dL (ref 7.0–26.0)
Calcium: 9.6 mg/dL (ref 8.4–10.4)
Chloride: 105 mEq/L (ref 98–109)
Creatinine: 1.3 mg/dL (ref 0.7–1.3)
Glucose: 80 mg/dl (ref 70–140)
Potassium: 4.3 mEq/L (ref 3.5–5.1)

## 2013-07-16 LAB — CBC WITH DIFFERENTIAL/PLATELET
BASO%: 0.7 % (ref 0.0–2.0)
LYMPH%: 11.5 % — ABNORMAL LOW (ref 14.0–49.0)
MCH: 23.2 pg — ABNORMAL LOW (ref 27.2–33.4)
MCHC: 31.9 g/dL — ABNORMAL LOW (ref 32.0–36.0)
MCV: 72.9 fL — ABNORMAL LOW (ref 79.3–98.0)
MONO%: 12.2 % (ref 0.0–14.0)
NEUT%: 72.6 % (ref 39.0–75.0)
Platelets: 488 10*3/uL — ABNORMAL HIGH (ref 140–400)
RBC: 7.98 10*6/uL — ABNORMAL HIGH (ref 4.20–5.82)

## 2013-07-16 LAB — TECHNOLOGIST REVIEW

## 2013-07-16 NOTE — Telephone Encounter (Signed)
Per staff message and POF I have scheduled appts.  JMW  

## 2013-07-16 NOTE — Progress Notes (Signed)
0900-Phlebotomy to left ac without difficulty.  510 grams removed, ending at 0908.  Pressure dressing applied.  Pt refuses to stay for 30 minutes observation post phlebotomy.  Pt did take snack and drink after phlebotomy.  6440- Pt discharged to home with no complaints.  VS stable. Pressure dressing clean, dry and intact to left ac.

## 2013-07-16 NOTE — Progress Notes (Signed)
Hematology and Oncology Follow Up Visit  Gregory Blankenship [id number] May 13, 1941 72 y.o. 07/16/2013 8:31 AM   Principle Diagnosis: 72 year old gentleman with polycythemia diagnosed February of 2013 presented with a hemoglobin of 18. 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  Secondary diagnosis: History of prostate cancer diagnosed in 2011. He is also history of coronary arteries disease had a pulmonary embolus.  Current therapy:  Phlebotomy to keep his Hgb close to 17.   Interim History: Gregory Blankenship presents today for a followup visit. He is a pleasant man with elevated Hgb and WBC. 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 He is doing well without any major changes in his clinical status. He had not had any bleeding complications he had not had any thrombotic events she is continued to be anticoagulated with Coumadin and aspirin. He is very active and exercise regularly. He is not reporting any headaches, chest pain or difficulty breathing. He just returned from a trip to [place] and was able to enjoy it without complications.    Medications: I have reviewed the patient's current medications. Current Outpatient Prescriptions  Medication Sig Dispense Refill  . acetaminophen (TYLENOL) 325 MG tablet Take 650 mg by mouth every 6 (six) hours as needed for pain.      Marland Kitchen aspirin 81 MG tablet Take 81 mg by mouth daily.      . carvedilol (COREG) 6.25 MG tablet TAKE ONE TABLET BY MOUTH TWICE DAILY  60 tablet  6  . isosorbide mononitrate (IMDUR) 30 MG 24 hr tablet TAKE 1 TABLET BY MOUTH ONCE DAILY  30 tablet  3  . levothyroxine (SYNTHROID, LEVOTHROID) 88 MCG tablet Take 88 mcg by mouth daily.      Marland Kitchen lisinopril (PRINIVIL,ZESTRIL) 5 MG tablet Take 1 tablet (5 mg total) by mouth daily.  30 tablet  5  . lisinopril (PRINIVIL,ZESTRIL) 5 MG tablet TAKE 1 TABLET BY MOUTH EVERY DAY  30 tablet  3  . nitroGLYCERIN (NITROSTAT) 0.4 MG SL tablet Place 0.4 mg under the tongue every 5 (five)  minutes as needed for chest pain.      . simvastatin (ZOCOR) 20 MG tablet Take 20 mg by mouth every evening. Managed by PCP, Dr. Nehemiah Settle      . warfarin (COUMADIN) 5 MG tablet Take 5 mg by mouth daily. Managed by PCP, Dr. Nehemiah Settle       No current facility-administered medications for this visit.    Allergies:  Allergies  Allergen Reactions  . Erythromycin Nausea Only    Past Medical History, Surgical history, Social history, and Family History were reviewed and updated.  Review of Systems:  Remaining ROS negative. Physical Exam: Blood pressure 100/74, pulse 57, temperature 97 F (36.1 C), temperature source Oral, resp. rate 18, height 5\' 11"  (1.803 m), weight 190 lb 12.8 oz (86.546 kg). ECOG: 1 General appearance: Alert and oriented and appears in no active distress. Head: Normocephalic, atraumatic. Pupils equal, round, reactive to light. Oral mucosa moist and pink.  Neck: Supple without adenopathy.  Heart: Regular rate and rhythm. S1, S2.  Lungs: Clear to auscultation. No rhonchi, wheeze, no dullness to percussion.  Abdomen: Soft, nontender. No hepatosplenomegaly.  Extremities: No clubbing, cyanosis, or edema.  Neurologic: Intact motor, sensory and deep tendon reflexes.   Lab Results: Lab Results  Component Value Date   WBC 16.8* 07/16/2013   HGB 18.5* 07/16/2013   HCT 58.2* 07/16/2013   MCV 72.9* 07/16/2013   PLT 488* 07/16/2013  Chemistry      Component Value Date/Time   NA 134* 04/15/2013 1446   NA 138 05/23/2012 0853   K 5.1 04/15/2013 1446   K 5.4* 05/23/2012 0853   CL 102 02/05/2013 1429   CL 101 05/23/2012 0853   CO2 24 04/15/2013 1446   CO2 28 05/23/2012 0853   BUN 24.6 04/15/2013 1446   BUN 22 05/23/2012 0853   CREATININE 1.4* 04/15/2013 1446   CREATININE 1.26 05/23/2012 0853      Component Value Date/Time   CALCIUM 9.1 04/15/2013 1446   CALCIUM 9.4 05/23/2012 0853   ALKPHOS 78 04/15/2013 1446   ALKPHOS 79 05/23/2012 0853   AST 26 04/15/2013 1446   AST 33 05/23/2012 0853    ALT 19 04/15/2013 1446   ALT 28 05/23/2012 0853   BILITOT 0.67 04/15/2013 1446   BILITOT 0.8 05/23/2012 0853      Impression and Plan:  72 year old old with the following issues:  1. 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 His Hgb is up today to 18.5. He is planning another trip to [place] in 10/2013. We plan to preform phlebotomy monthly for 3 months in anticipation of his travel.    2. Thrombosis prophylaxis: He is continued to be anticoagulated with Coumadin at low-dose aspirin.  SHADAD,FIRAS, MD 10/8/20148:31 AM

## 2013-07-16 NOTE — Patient Instructions (Signed)

## 2013-07-16 NOTE — Telephone Encounter (Signed)
gv and printed appt sched and avs for NOV thru Jan 2015 ...emailed MW to add tx.

## 2013-08-13 ENCOUNTER — Other Ambulatory Visit: Payer: Self-pay | Admitting: *Deleted

## 2013-08-13 ENCOUNTER — Ambulatory Visit (HOSPITAL_BASED_OUTPATIENT_CLINIC_OR_DEPARTMENT_OTHER): Payer: Medicare Other

## 2013-08-13 ENCOUNTER — Other Ambulatory Visit (HOSPITAL_BASED_OUTPATIENT_CLINIC_OR_DEPARTMENT_OTHER): Payer: Medicare Other | Admitting: Lab

## 2013-08-13 DIAGNOSIS — D45 Polycythemia vera: Secondary | ICD-10-CM

## 2013-08-13 LAB — CBC WITH DIFFERENTIAL/PLATELET
BASO%: 0.5 % (ref 0.0–2.0)
Eosinophils Absolute: 0.4 10*3/uL (ref 0.0–0.5)
HGB: 17.9 g/dL — ABNORMAL HIGH (ref 13.0–17.1)
LYMPH%: 10.8 % — ABNORMAL LOW (ref 14.0–49.0)
MCHC: 31.8 g/dL — ABNORMAL LOW (ref 32.0–36.0)
MONO#: 2.3 10*3/uL — ABNORMAL HIGH (ref 0.1–0.9)
MONO%: 12.9 % (ref 0.0–14.0)
NEUT#: 13.2 10*3/uL — ABNORMAL HIGH (ref 1.5–6.5)
RBC: 7.65 10*6/uL — ABNORMAL HIGH (ref 4.20–5.82)
RDW: 16.8 % — ABNORMAL HIGH (ref 11.0–14.6)
WBC: 18 10*3/uL — ABNORMAL HIGH (ref 4.0–10.3)
lymph#: 1.9 10*3/uL (ref 0.9–3.3)

## 2013-08-13 NOTE — Patient Instructions (Signed)

## 2013-08-13 NOTE — Progress Notes (Signed)
Phlebotomy performed via left antecubital.  468 cc blood removed.  Patient tolerated procedure but started to feel dizzy within 5 minutes. Patient reclined and offered fluids.  BP 115/81 HR 46.  Patient states feeling better after drinking fluids.  1610 Patient observed for 30 minutes and given snack and drink. Post procedure vs taken and patient stated that he felt fine.

## 2013-08-22 ENCOUNTER — Encounter: Payer: Self-pay | Admitting: Cardiovascular Disease

## 2013-08-22 ENCOUNTER — Ambulatory Visit (INDEPENDENT_AMBULATORY_CARE_PROVIDER_SITE_OTHER): Payer: Medicare Other | Admitting: Cardiovascular Disease

## 2013-08-22 VITALS — BP 110/80 | HR 47 | Ht 73.0 in | Wt 193.0 lb

## 2013-08-22 DIAGNOSIS — E785 Hyperlipidemia, unspecified: Secondary | ICD-10-CM

## 2013-08-22 DIAGNOSIS — I2510 Atherosclerotic heart disease of native coronary artery without angina pectoris: Secondary | ICD-10-CM

## 2013-08-22 DIAGNOSIS — I10 Essential (primary) hypertension: Secondary | ICD-10-CM | POA: Insufficient documentation

## 2013-08-22 DIAGNOSIS — E782 Mixed hyperlipidemia: Secondary | ICD-10-CM

## 2013-08-22 DIAGNOSIS — I213 ST elevation (STEMI) myocardial infarction of unspecified site: Secondary | ICD-10-CM

## 2013-08-22 DIAGNOSIS — Z79899 Other long term (current) drug therapy: Secondary | ICD-10-CM

## 2013-08-22 DIAGNOSIS — I219 Acute myocardial infarction, unspecified: Secondary | ICD-10-CM

## 2013-08-22 DIAGNOSIS — I2699 Other pulmonary embolism without acute cor pulmonale: Secondary | ICD-10-CM

## 2013-08-22 NOTE — Assessment & Plan Note (Signed)
2 anticoagulation. This occurred in 2011 after A. prostate operation

## 2013-08-22 NOTE — Assessment & Plan Note (Signed)
Patient had a STEMI 12/02/11.. I cath and radially revealing an occluded LAD in the midportion which I was unable to recanalize.. 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 His initial EF was 30% with anteroapical dyskinesia which ultimately improved to 50% by 2-D echo June of 2013. He is completely asymptomatic on appropriate medications.

## 2013-08-22 NOTE — Progress Notes (Signed)
08/22/2013 Gregory Blankenship   1941/01/01  [id number]  Primary Physician Gregory Apo, MD Primary Cardiologist: Gregory Gess MD Gregory Blankenship   HPI:  The patient is a 72 year old, thin and fit-appearing, married Caucasian male, father of 3, grandfather to 5 grandchildren who I last saw in the office 6 months ago. He has a history of an anterior ST-segment-elevation myocardial infarction December 02, 2011. He was on Coumadin anticoagulation because of prior pulmonary embolus. I cath'd him radially revealing a left dominant system with an occluded mid LAD. I tried to percutaneously recanalize him. However, this was unsuccessful probably because this was a "CTO." He did have anterior wall motion abnormalities and EF of 30% and anteroapical dyskinesia which ultimately improved over time to an EF of 50% by 2D echo in June of this year. He is completely asymptomatic. He participates in cardiac rehab. His lipid profile was excellent back in February 2013 with a total cholesterol 146, LDL of 88 and HDL of 38.      Current Outpatient Prescriptions  Medication Sig Dispense Refill  . acetaminophen (TYLENOL) 325 MG tablet Take 650 mg by mouth every 6 (six) hours as needed for pain.      Marland Kitchen aspirin 81 MG tablet Take 81 mg by mouth daily.      Marland Kitchen CARAC 0.5 % cream       . carvedilol (COREG) 6.25 MG tablet TAKE ONE TABLET BY MOUTH TWICE DAILY  60 tablet  6  . fluticasone (FLONASE) 50 MCG/ACT nasal spray       . isosorbide mononitrate (IMDUR) 30 MG 24 hr tablet TAKE 1 TABLET BY MOUTH ONCE DAILY  30 tablet  3  . levothyroxine (SYNTHROID, LEVOTHROID) 88 MCG tablet Take 88 mcg by mouth daily.      Marland Kitchen lisinopril (PRINIVIL,ZESTRIL) 5 MG tablet TAKE 1 TABLET BY MOUTH EVERY DAY  30 tablet  3  . nitroGLYCERIN (NITROSTAT) 0.4 MG SL tablet Place 0.4 mg under the tongue every 5 (five) minutes as needed for chest pain.      . simvastatin (ZOCOR) 20 MG tablet Take 20 mg by mouth every evening. Managed by  PCP, Dr. Nehemiah Blankenship      . warfarin (COUMADIN) 5 MG tablet Take 5 mg by mouth daily. Managed by PCP, Dr. Nehemiah Blankenship       No current facility-administered medications for this visit.    Allergies  Allergen Reactions  . Erythromycin Nausea Only    History   Social History  . Marital Status: Married    Spouse Name: N/A    Number of Children: N/A  . Years of Education: N/A   Occupational History  . Not on file.   Social History Main Topics  . Smoking status: Former Smoker    Quit date: 12/01/1968  . Smokeless tobacco: Not on file  . Alcohol Use: 0.6 oz/week    1 Glasses of wine per week  . Drug Use:   . Sexual Activity:    Other Topics Concern  . Not on file   Social History Narrative  . No narrative on file     Review of Systems: General: negative for chills, fever, night sweats or weight changes.  Cardiovascular: negative for chest pain, dyspnea on exertion, edema, orthopnea, palpitations, paroxysmal nocturnal dyspnea or shortness of breath Dermatological: negative for rash Respiratory: negative for cough or wheezing Urologic: negative for hematuria Abdominal: negative for nausea, vomiting, diarrhea, bright red blood per rectum, melena, or hematemesis Neurologic: negative  for visual changes, syncope, or dizziness All other systems reviewed and are otherwise negative except as noted above.    Blood pressure 110/80, pulse 47, height 6\' 1"  (1.854 m), weight 193 lb (87.544 kg).  General appearance: alert and no distress Neck: no adenopathy, no carotid bruit, no JVD, supple, symmetrical, trachea midline and thyroid not enlarged, symmetric, no tenderness/mass/nodules Lungs: clear to auscultation bilaterally Heart: regular rate and rhythm, S1, S2 normal, no murmur, click, rub or gallop Extremities: extremities normal, atraumatic, no cyanosis or edema  EKG sinus bradycardia at 47 with septal Q waves and T-wave inversion in leads V1 and V2  ASSESSMENT AND PLAN:   Pulmonary  embolism, 9/11 after prostate surgery 2 anticoagulation. This occurred in 2011 after A. prostate operation  STEMI (ST elevation myocardial infarction) Patient had a STEMI 12/02/11.. I cath and radially revealing an occluded LAD in the midportion which I was unable to recanalize.. 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 His initial EF was 30% with anteroapical dyskinesia which ultimately improved to 50% by 2-D echo June of 2013. He is completely asymptomatic on appropriate medications.  Hyperlipidemia On statin therapy followed by his PCP. It's been over years and this was checked and I will recheck it      Gregory Gess MD [facility] 08/22/2013 11:18 AM

## 2013-08-22 NOTE — Patient Instructions (Signed)
Your physician recommends that you schedule a follow-up appointment in: 1 year   Your physician recommends that you return for lab work Lipid and Liver function test

## 2013-08-22 NOTE — Assessment & Plan Note (Signed)
On statin therapy followed by his PCP. It's been over years and this was checked and I will recheck it

## 2013-08-26 ENCOUNTER — Encounter: Payer: Self-pay | Admitting: Cardiovascular Disease

## 2013-08-27 LAB — HEPATIC FUNCTION PANEL
ALT: 17 U/L (ref 0–53)
AST: 25 U/L (ref 0–37)
Albumin: 4.1 g/dL (ref 3.5–5.2)
Alkaline Phosphatase: 85 U/L (ref 39–117)
Total Bilirubin: 0.6 mg/dL (ref 0.3–1.2)
Total Protein: 6.6 g/dL (ref 6.0–8.3)

## 2013-08-27 LAB — LIPID PANEL
Cholesterol: 141 mg/dL (ref 0–200)
HDL: 42 mg/dL (ref >39)
Total CHOL/HDL Ratio: 3.4 Ratio
Triglycerides: 83 mg/dL (ref <150)
VLDL: 17 mg/dL (ref 0–40)

## 2013-09-01 ENCOUNTER — Encounter: Payer: Self-pay | Admitting: *Deleted

## 2013-09-02 ENCOUNTER — Other Ambulatory Visit: Payer: Self-pay | Admitting: Vascular Surgery

## 2013-09-02 DIAGNOSIS — I8002 Phlebitis and thrombophlebitis of superficial vessels of left lower extremity: Secondary | ICD-10-CM

## 2013-09-03 ENCOUNTER — Telehealth: Payer: Self-pay | Admitting: Cardiovascular Disease

## 2013-09-03 NOTE — Telephone Encounter (Signed)
Lab results reviewed with the patient. Letter was mailed with results earlier this week.

## 2013-09-03 NOTE — Telephone Encounter (Signed)
Would like lab results from last week or the week before? He would like to know today if possible.

## 2013-09-10 ENCOUNTER — Other Ambulatory Visit (HOSPITAL_BASED_OUTPATIENT_CLINIC_OR_DEPARTMENT_OTHER): Payer: Medicare Other | Admitting: Lab

## 2013-09-10 ENCOUNTER — Ambulatory Visit (HOSPITAL_BASED_OUTPATIENT_CLINIC_OR_DEPARTMENT_OTHER): Payer: Medicare Other

## 2013-09-10 ENCOUNTER — Other Ambulatory Visit: Payer: Self-pay | Admitting: Oncology

## 2013-09-10 DIAGNOSIS — D45 Polycythemia vera: Secondary | ICD-10-CM

## 2013-09-10 DIAGNOSIS — D72829 Elevated white blood cell count, unspecified: Secondary | ICD-10-CM

## 2013-09-10 LAB — CBC WITH DIFFERENTIAL/PLATELET
BASO%: 1.2 % (ref 0.0–2.0)
EOS%: 2.6 % (ref 0.0–7.0)
HCT: 55.2 % — ABNORMAL HIGH (ref 38.4–49.9)
MCH: 23.2 pg — ABNORMAL LOW (ref 27.2–33.4)
MCHC: 31.4 g/dL — ABNORMAL LOW (ref 32.0–36.0)
MONO#: 2.5 10*3/uL — ABNORMAL HIGH (ref 0.1–0.9)
MONO%: 12.5 % (ref 0.0–14.0)
NEUT%: 74.2 % (ref 39.0–75.0)
RBC: 7.46 10*6/uL — ABNORMAL HIGH (ref 4.20–5.82)
RDW: 15.7 % — ABNORMAL HIGH (ref 11.0–14.6)
WBC: 20.1 10*3/uL — ABNORMAL HIGH (ref 4.0–10.3)
lymph#: 1.9 10*3/uL (ref 0.9–3.3)

## 2013-09-10 LAB — TECHNOLOGIST REVIEW

## 2013-09-10 NOTE — Patient Instructions (Signed)

## 2013-09-12 ENCOUNTER — Encounter: Payer: Self-pay | Admitting: Vascular Surgery

## 2013-09-15 ENCOUNTER — Ambulatory Visit (HOSPITAL_COMMUNITY)
Admission: RE | Admit: 2013-09-15 | Discharge: 2013-09-15 | Disposition: A | Discharge status: Home / Self Care | Payer: Medicare Other | Source: Ambulatory Visit | Attending: Vascular Surgery | Admitting: Vascular Surgery

## 2013-09-15 ENCOUNTER — Ambulatory Visit (INDEPENDENT_AMBULATORY_CARE_PROVIDER_SITE_OTHER): Payer: Medicare Other | Admitting: Vascular Surgery

## 2013-09-15 ENCOUNTER — Encounter: Payer: Self-pay | Admitting: Vascular Surgery

## 2013-09-15 ENCOUNTER — Encounter (INDEPENDENT_AMBULATORY_CARE_PROVIDER_SITE_OTHER): Payer: Self-pay

## 2013-09-15 VITALS — BP 110/74 | HR 60 | Resp 16 | Ht 73.0 in | Wt 186.0 lb

## 2013-09-15 DIAGNOSIS — I8002 Phlebitis and thrombophlebitis of superficial vessels of left lower extremity: Secondary | ICD-10-CM

## 2013-09-15 DIAGNOSIS — I83893 Varicose veins of bilateral lower extremities with other complications: Secondary | ICD-10-CM

## 2013-09-15 DIAGNOSIS — I8000 Phlebitis and thrombophlebitis of superficial vessels of unspecified lower extremity: Secondary | ICD-10-CM

## 2013-09-15 NOTE — Progress Notes (Signed)
Subjective:     Patient ID: Gregory Blankenship, male   DOB: 1941-02-18, 72 y.o.   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 He has remote history of bilateral vein stripping as a teenager probably from the knee to the ankle. I evaluated him and 2011 and he was found to have reflux in the left great saphenous system supplying many bulging varicosities and he underwent laser ablation left great saphenous vein. He also had multiple stab phlebectomy. He had a good result but did develop recurrent varicosities over the next few years in both legs. Right leg has also become much worse. 4 weeks ago he developed thrombophlebitis in the left medial thigh. He has been on Coumadin since 2011 because he suffered a pulmonary embolus. He also has polycythemia. Now he is experiencing aching throbbing and burning discomfort in both legs. He has been wearing elastic compression stockings for the past month-long-leg 20-30 mm gradient and no improvement in his symptoms.  Past Medical History  Diagnosis Date  . Hypothyroid   . Pulmonary embolism   . Coronary artery disease   . Hypertension   . Hyperlipidemia   . Polycythemia   . Varicose veins     History  Substance Use Topics  . Smoking status: Former Smoker    Quit date: 12/01/1968  . Smokeless tobacco: Not on file  . Alcohol Use: 0.6 oz/week    1 Glasses of wine per week    Family History  Problem Relation Age of Onset  . Varicose Veins Mother     Allergies  Allergen Reactions  . Erythromycin Nausea Only    Current outpatient prescriptions:acetaminophen (TYLENOL) 325 MG tablet, Take 650 mg by mouth every 6 (six) hours as needed for pain., Disp: , Rfl: ;  aspirin 81 MG tablet, Take 81 mg by mouth daily., Disp: , Rfl: ;  CARAC 0.5 % cream, , Disp: , Rfl: ;  carvedilol (COREG) 6.25 MG tablet, TAKE ONE TABLET BY MOUTH TWICE DAILY, Disp: 60 tablet, Rfl: 6;  fluticasone (FLONASE) 50 MCG/ACT  nasal spray, , Disp: , Rfl:  isosorbide mononitrate (IMDUR) 30 MG 24 hr tablet, TAKE 1 TABLET BY MOUTH ONCE DAILY, Disp: 30 tablet, Rfl: 3;  levothyroxine (SYNTHROID, LEVOTHROID) 88 MCG tablet, Take 88 mcg by mouth daily., Disp: , Rfl: ;  lisinopril (PRINIVIL,ZESTRIL) 5 MG tablet, TAKE 1 TABLET BY MOUTH EVERY DAY, Disp: 30 tablet, Rfl: 3;  nitroGLYCERIN (NITROSTAT) 0.4 MG SL tablet, Place 0.4 mg under the tongue every 5 (five) minutes as needed for chest pain., Disp: , Rfl:  simvastatin (ZOCOR) 20 MG tablet, Take 20 mg by mouth every evening. Managed by PCP, Dr. Nehemiah Settle, Disp: , Rfl: ;  warfarin (COUMADIN) 5 MG tablet, Take 5 mg by mouth daily. Managed by PCP, Dr. Nehemiah Settle, Disp: , Rfl:   BP 110/74  Pulse 60  Resp 16  Ht 6\' 1"  (1.854 m)  Wt 186 lb (84.369 kg)  BMI 24.55 kg/m2  Body mass index is 24.55 kg/(m^2).           Review of Systems denies chest pain, dyspnea on exertion, PND, orthopnea, hemoptysis, claudication. Does have polycythemia, has a history of myocardial infarction, history of pulmonary embolism, and has had prostatectomy for cancer. Other systems negative complete review of    Objective:   Physical Exam BP 110/74  Pulse 60  Resp 16  Ht 6\' 1"  (1.854 m)  Wt 186 lb (84.369 kg)  BMI 24.55 kg/m2  Gen.-alert and oriented x3 in no apparent distress HEENT normal for age Lungs no rhonchi or wheezing Cardiovascular regular rhythm no murmurs carotid pulses 3+ palpable no bruits audible Abdomen soft nontender no palpable masses Musculoskeletal free of  major deformities Skin clear -no rashes Neurologic normal Lower extremities 3+ femoral and dorsalis pedis pulses palpable bilaterally with 1+ edema bilaterally. Both legs have extensive bulging varicosities in the great saphenous system. Right leg is in the medial thigh extending anteriorly down into the medial calf and to the ankle level with some posterior varicosities. No active ulcer noted. Left leg has anterior thigh  medial varicosities with a 10 cm segment of resolving thrombophlebitis with large varicosities which are thrombosed consistent with about 4 weeks ago. He also has medial thigh and calf varicosities distal to this no active ulceration noted.  Today I performed a venous duplex exam the left leg chart reviewed and interpreted. He does have gross reflux now on the anterior accessory branch the left great saphenous vein in the great saphenous vein remains closed from previous ablation. There is no DVT. Right leg was not studied today but does have gross reflux on a bedside SonoSite ultrasound exam by me in the great saphenous system from the mid thigh to the saphenofemoral junction.      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 Patient has demonstrated severe reflux and anterior chest through branch left great saphenous vein by duplex today. Patient is having symptoms which are affecting his daily living.    Plan:         #1 long leg elastic compression stockings 20-30 mm gradient #2 elevate legs as much as possible #3 ibuprofen daily on a regular basis for pain #4 return in 3 months-if no significant improvement then patient will need laser ablation anterior chest through branch left great saphenous vein to be followed in 3 months with probable old stab phlebectomy He will also have a formal venous duplex exam of the right leg performed when he returns and will likely need laser ablation right great saphenous vein to be followed in 3 months with multiple stab phlebectomy We'll need to discontinue his Coumadin at the time of these procedures and we'll need to determine whether bridging with Lovenox will be necessary versus temporarily stopping Coumadin Return in 3 month

## 2013-09-22 ENCOUNTER — Other Ambulatory Visit: Payer: Self-pay | Admitting: *Deleted

## 2013-09-22 DIAGNOSIS — I83893 Varicose veins of bilateral lower extremities with other complications: Secondary | ICD-10-CM

## 2013-10-06 ENCOUNTER — Telehealth: Payer: Self-pay | Admitting: *Deleted

## 2013-10-06 NOTE — Telephone Encounter (Signed)
Patient called and left message that he needs to move his lab/MD visit. Message given to the desk RN.  JMW

## 2013-10-07 ENCOUNTER — Other Ambulatory Visit: Payer: Self-pay | Admitting: *Deleted

## 2013-10-08 ENCOUNTER — Other Ambulatory Visit: Payer: Self-pay | Admitting: *Deleted

## 2013-10-08 ENCOUNTER — Telehealth: Payer: Self-pay | Admitting: Oncology

## 2013-10-08 NOTE — Telephone Encounter (Signed)
s.w. pt wife and advised on new /first available appt ok adn aware

## 2013-10-10 ENCOUNTER — Ambulatory Visit: Payer: Medicare Other | Admitting: Oncology

## 2013-10-10 ENCOUNTER — Other Ambulatory Visit: Payer: Medicare Other

## 2013-11-05 ENCOUNTER — Other Ambulatory Visit: Payer: Self-pay | Admitting: Cardiovascular Disease

## 2013-11-05 NOTE — Telephone Encounter (Signed)
Rx was sent to pharmacy electronically. 

## 2013-11-06 ENCOUNTER — Other Ambulatory Visit: Payer: Self-pay | Admitting: *Deleted

## 2013-11-06 MED ORDER — CARVEDILOL 6.25 MG PO TABS: ORAL_TABLET | ORAL | Status: DC

## 2013-11-06 NOTE — Telephone Encounter (Signed)
Rx was sent to pharmacy electronically. 

## 2013-11-19 ENCOUNTER — Ambulatory Visit (HOSPITAL_BASED_OUTPATIENT_CLINIC_OR_DEPARTMENT_OTHER): Payer: Medicare Other | Admitting: Oncology

## 2013-11-19 ENCOUNTER — Encounter: Payer: Self-pay | Admitting: Oncology

## 2013-11-19 ENCOUNTER — Other Ambulatory Visit (HOSPITAL_BASED_OUTPATIENT_CLINIC_OR_DEPARTMENT_OTHER): Payer: Medicare Other

## 2013-11-19 VITALS — BP 106/65 | HR 56 | Temp 97.7°F | Resp 18 | Ht 73.0 in | Wt 200.0 lb

## 2013-11-19 DIAGNOSIS — D45 Polycythemia vera: Secondary | ICD-10-CM

## 2013-11-19 LAB — COMPREHENSIVE METABOLIC PANEL (CC13)
ALT: 20 U/L (ref 0–55)
AST: 32 U/L (ref 5–34)
Albumin: 4.3 g/dL (ref 3.5–5.0)
Alkaline Phosphatase: 89 U/L (ref 40–150)
Anion Gap: 9 mEq/L (ref 3–11)
BUN: 19.2 mg/dL (ref 7.0–26.0)
CO2: 26 mEq/L (ref 22–29)
Calcium: 9.6 mg/dL (ref 8.4–10.4)
Chloride: 104 mEq/L (ref 98–109)
Creatinine: 1.3 mg/dL (ref 0.7–1.3)
Glucose: 78 mg/dl (ref 70–140)
Potassium: 4.4 mEq/L (ref 3.5–5.1)
Sodium: 139 mEq/L (ref 136–145)
Total Bilirubin: 0.53 mg/dL (ref 0.20–1.20)
Total Protein: 7.1 g/dL (ref 6.4–8.3)

## 2013-11-19 LAB — CBC WITH DIFFERENTIAL/PLATELET
BASO%: 1.3 % (ref 0.0–2.0)
Basophils Absolute: 0.3 10*3/uL — ABNORMAL HIGH (ref 0.0–0.1)
EOS%: 2.2 % (ref 0.0–7.0)
Eosinophils Absolute: 0.4 10*3/uL (ref 0.0–0.5)
HCT: 53.9 % — ABNORMAL HIGH (ref 38.4–49.9)
HGB: 17.5 g/dL — ABNORMAL HIGH (ref 13.0–17.1)
LYMPH%: 10.1 % — ABNORMAL LOW (ref 14.0–49.0)
MCH: 22.9 pg — ABNORMAL LOW (ref 27.2–33.4)
MCHC: 32.5 g/dL (ref 32.0–36.0)
MCV: 70.5 fL — AB (ref 79.3–98.0)
MONO#: 2.8 10*3/uL — ABNORMAL HIGH (ref 0.1–0.9)
MONO%: 14.5 % — ABNORMAL HIGH (ref 0.0–14.0)
NEUT#: 13.6 10*3/uL — ABNORMAL HIGH (ref 1.5–6.5)
NEUT%: 71.9 % (ref 39.0–75.0)
NRBC: 0 % (ref 0–0)
PLATELETS: 543 10*3/uL — AB (ref 140–400)
RBC: 7.64 10*6/uL — AB (ref 4.20–5.82)
RDW: 17.4 % — ABNORMAL HIGH (ref 11.0–14.6)
WBC: 18.9 10*3/uL — AB (ref 4.0–10.3)
lymph#: 1.9 10*3/uL (ref 0.9–3.3)

## 2013-11-19 NOTE — Progress Notes (Signed)
Hematology and Oncology Follow Up Visit  Gregory Blankenship [id number] 1941/05/19 73 y.o. 11/19/2013 9:51 AM   Principle Diagnosis: 73 year old gentleman with polycythemia diagnosed February of 2013 presented with a hemoglobin of 18. 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  Secondary diagnosis: History of prostate cancer diagnosed in 2011. He is also history of coronary arteries disease had a pulmonary embolus.  Current therapy:  Phlebotomy to keep his Hgb close to 17.   Interim History: Gregory Blankenship presents today for a followup visit. He is a pleasant man with elevated Hgb and WBC. 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 He is doing well without any major changes in his clinical status. He had not had any bleeding complications he had not had any thrombotic events she is continued to be anticoagulated with Coumadin and aspirin. He is very active and exercise regularly. He is not reporting any headaches, chest pain or difficulty breathing. He is planning another trip to [place] in the near future.  Medications: I have reviewed the patient's current medications. Current Outpatient Prescriptions  Medication Sig Dispense Refill  . acetaminophen (TYLENOL) 325 MG tablet Take 650 mg by mouth every 6 (six) hours as needed for pain.      Marland Kitchen aspirin 81 MG tablet Take 81 mg by mouth daily.      Marland Kitchen CARAC 0.5 % cream       . carvedilol (COREG) 6.25 MG tablet TAKE ONE TABLET BY MOUTH TWICE DAILY  180 tablet  2  . fluticasone (FLONASE) 50 MCG/ACT nasal spray       . isosorbide mononitrate (IMDUR) 30 MG 24 hr tablet TAKE 1 TABLET BY MOUTH ONCE DAILY  30 tablet  10  . levothyroxine (SYNTHROID, LEVOTHROID) 88 MCG tablet Take 88 mcg by mouth daily.      Marland Kitchen lisinopril (PRINIVIL,ZESTRIL) 5 MG tablet TAKE 1 TABLET BY MOUTH EVERY DAY  30 tablet  10  . nitroGLYCERIN (NITROSTAT) 0.4 MG SL tablet Place 0.4 mg under the tongue every 5 (five) minutes as needed for chest pain.      . simvastatin  (ZOCOR) 20 MG tablet Take 20 mg by mouth every evening. Managed by PCP, Dr. Delfina Redwood      . warfarin (COUMADIN) 5 MG tablet Take 5 mg by mouth daily. Managed by PCP, Dr. Delfina Redwood       No current facility-administered medications for this visit.    Allergies:  Allergies  Allergen Reactions  . Erythromycin Nausea Only    Past Medical History, Surgical history, Social history, and Family History were reviewed and updated.  Review of Systems:  Remaining ROS negative. Physical Exam: Blood pressure 106/65, pulse 56, temperature 97.7 F (36.5 C), temperature source Oral, resp. rate 18, height 6\' 1"  (1.854 m), weight 200 lb (90.719 kg), SpO2 98.00%. ECOG: 1 General appearance: Alert and oriented and appears in no active distress. Head: Normocephalic, atraumatic. Pupils equal, round, reactive to light. Oral mucosa moist and pink.  Neck: Supple without adenopathy.  Heart: Regular rate and rhythm. S1, S2.  Lungs: Clear to auscultation. No rhonchi, wheeze, no dullness to percussion.  Abdomen: Soft, nontender. No hepatosplenomegaly.  Extremities: No clubbing, cyanosis, or edema.  Neurologic: Intact motor, sensory and deep tendon reflexes.   Lab Results: Lab Results  Component Value Date   WBC 18.9* 11/19/2013   HGB 17.5* 11/19/2013   HCT 53.9* 11/19/2013   MCV 70.5* 11/19/2013   PLT 543* 11/19/2013     Chemistry  Component Value Date/Time   NA 139 11/19/2013 0851   NA 138 05/23/2012 0853   K 4.4 11/19/2013 0851   K 5.4* 05/23/2012 0853   CL 102 02/05/2013 1429   CL 101 05/23/2012 0853   CO2 26 11/19/2013 0851   CO2 28 05/23/2012 0853   BUN 19.2 11/19/2013 0851   BUN 22 05/23/2012 0853   CREATININE 1.3 11/19/2013 0851   CREATININE 1.26 05/23/2012 0853      Component Value Date/Time   CALCIUM 9.6 11/19/2013 0851   CALCIUM 9.4 05/23/2012 0853   ALKPHOS 89 11/19/2013 0851   ALKPHOS 85 08/27/2013 0808   AST 32 11/19/2013 0851   AST 25 08/27/2013 0808   ALT 20 11/19/2013 0851   ALT 17  08/27/2013 0808   BILITOT 0.53 11/19/2013 0851   BILITOT 0.6 08/27/2013 0808      Impression and Plan:  73 year old old with the following issues:  1. 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 His Hgb is under reasonable control around 17.5 and asymptomatic. I plan on cold off on any phlebotomies at this time and I will reevaluate him in about 2-3 months. If he is symptomatic, or his hemoglobin is above 18 we will consider a phlebotomy at that time.   2. Thrombosis prophylaxis: He is continued to be anticoagulated with Coumadin at low-dose aspirin.  [facility], MD 2/11/20159:51 AM

## 2013-12-03 ENCOUNTER — Other Ambulatory Visit: Payer: Self-pay | Admitting: Cardiology

## 2014-01-05 ENCOUNTER — Telehealth: Payer: Self-pay | Admitting: Oncology

## 2014-01-05 NOTE — Telephone Encounter (Signed)
lvm for pt regarding to April appt....mailed pt appt sched/avs and letter °

## 2014-01-13 ENCOUNTER — Inpatient Hospital Stay (HOSPITAL_COMMUNITY): Admission: RE | Admit: 2014-01-13 | Payer: Medicare Other | Source: Ambulatory Visit

## 2014-01-13 ENCOUNTER — Ambulatory Visit: Payer: Medicare Other | Admitting: Vascular Surgery

## 2014-01-23 ENCOUNTER — Other Ambulatory Visit (HOSPITAL_BASED_OUTPATIENT_CLINIC_OR_DEPARTMENT_OTHER): Payer: Medicare Other

## 2014-01-23 ENCOUNTER — Telehealth: Payer: Self-pay | Admitting: Oncology

## 2014-01-23 ENCOUNTER — Ambulatory Visit (HOSPITAL_BASED_OUTPATIENT_CLINIC_OR_DEPARTMENT_OTHER): Payer: Medicare Other | Admitting: Oncology

## 2014-01-23 ENCOUNTER — Ambulatory Visit (HOSPITAL_BASED_OUTPATIENT_CLINIC_OR_DEPARTMENT_OTHER): Payer: Medicare Other

## 2014-01-23 ENCOUNTER — Telehealth: Payer: Self-pay | Admitting: *Deleted

## 2014-01-23 ENCOUNTER — Encounter: Payer: Self-pay | Admitting: Oncology

## 2014-01-23 VITALS — BP 117/75 | HR 49 | Temp 97.3°F | Resp 18 | Ht 73.0 in | Wt 196.8 lb

## 2014-01-23 DIAGNOSIS — Z7901 Long term (current) use of anticoagulants: Secondary | ICD-10-CM

## 2014-01-23 DIAGNOSIS — I2699 Other pulmonary embolism without acute cor pulmonale: Secondary | ICD-10-CM

## 2014-01-23 DIAGNOSIS — D45 Polycythemia vera: Secondary | ICD-10-CM

## 2014-01-23 DIAGNOSIS — D72829 Elevated white blood cell count, unspecified: Secondary | ICD-10-CM

## 2014-01-23 DIAGNOSIS — Z86711 Personal history of pulmonary embolism: Secondary | ICD-10-CM

## 2014-01-23 LAB — COMPREHENSIVE METABOLIC PANEL (CC13)
ALK PHOS: 93 U/L (ref 40–150)
ALT: 21 U/L (ref 0–55)
ANION GAP: 9 meq/L (ref 3–11)
AST: 28 U/L (ref 5–34)
Albumin: 4.2 g/dL (ref 3.5–5.0)
BUN: 24.3 mg/dL (ref 7.0–26.0)
CO2: 27 meq/L (ref 22–29)
CREATININE: 1.3 mg/dL (ref 0.7–1.3)
Calcium: 9.5 mg/dL (ref 8.4–10.4)
Chloride: 106 mEq/L (ref 98–109)
Glucose: 75 mg/dl (ref 70–140)
Potassium: 4.7 mEq/L (ref 3.5–5.1)
SODIUM: 142 meq/L (ref 136–145)
TOTAL PROTEIN: 7.5 g/dL (ref 6.4–8.3)
Total Bilirubin: 0.55 mg/dL (ref 0.20–1.20)

## 2014-01-23 LAB — CBC WITH DIFFERENTIAL/PLATELET
BASO%: 0.8 % (ref 0.0–2.0)
BASOS ABS: 0.2 10*3/uL — AB (ref 0.0–0.1)
EOS%: 2.3 % (ref 0.0–7.0)
Eosinophils Absolute: 0.5 10*3/uL (ref 0.0–0.5)
HEMATOCRIT: 57.1 % — AB (ref 38.4–49.9)
HGB: 17.9 g/dL — ABNORMAL HIGH (ref 13.0–17.1)
LYMPH#: 2.1 10*3/uL (ref 0.9–3.3)
LYMPH%: 10.3 % — ABNORMAL LOW (ref 14.0–49.0)
MCH: 22.1 pg — AB (ref 27.2–33.4)
MCHC: 31.4 g/dL — ABNORMAL LOW (ref 32.0–36.0)
MCV: 70.4 fL — ABNORMAL LOW (ref 79.3–98.0)
MONO#: 2.4 10*3/uL — ABNORMAL HIGH (ref 0.1–0.9)
MONO%: 12 % (ref 0.0–14.0)
NEUT#: 15 10*3/uL — ABNORMAL HIGH (ref 1.5–6.5)
NEUT%: 74.6 % (ref 39.0–75.0)
Platelets: 617 10*3/uL — ABNORMAL HIGH (ref 140–400)
RBC: 8.11 10*6/uL — ABNORMAL HIGH (ref 4.20–5.82)
RDW: 18.6 % — ABNORMAL HIGH (ref 11.0–14.6)
WBC: 20.1 10*3/uL — ABNORMAL HIGH (ref 4.0–10.3)

## 2014-01-23 LAB — TECHNOLOGIST REVIEW

## 2014-01-23 NOTE — Progress Notes (Signed)
Phlebotomy completed per MD order. 1 unit of blood removed from right antecubital without any problems. Patient given nourishments and observed 30 minutes afterwards and VS taken and charted. Cindi Carbon, RN

## 2014-01-23 NOTE — Patient Instructions (Signed)

## 2014-01-23 NOTE — Progress Notes (Signed)
Hematology and Oncology Follow Up Visit  Gregory Blankenship [id number] July 30, 1941 73 y.o. 01/23/2014 9:05 AM   Principle Diagnosis: 73 year old gentleman with polycythemia diagnosed February of 2013 presented with a hemoglobin of 18. 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  Secondary diagnosis: History of prostate cancer diagnosed in 2011. He is also history of coronary arteries disease had a pulmonary embolus.  Current therapy:  Phlebotomy to keep his Hgb close to 17.   Interim History: Gregory Blankenship presents today for a followup visit. He is a pleasant man with polycythemia vera. 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 He is doing well without any major changes in his clinical status. He had not had any bleeding complications he had not had any thrombotic events he is continued to be anticoagulated with Coumadin and aspirin. He is very active and exercise regularly. He is not reporting any headaches, chest pain or difficulty breathing. He was noted to have elevated potassium on recent laboratory data and his lisinopril was discontinued.  Medications: I have reviewed the patient's current medications. Current Outpatient Prescriptions  Medication Sig Dispense Refill  . acetaminophen (TYLENOL) 325 MG tablet Take 650 mg by mouth every 6 (six) hours as needed for pain.      Marland Kitchen aspirin 81 MG tablet Take 81 mg by mouth daily.      Marland Kitchen CARAC 0.5 % cream       . carvedilol (COREG) 6.25 MG tablet TAKE ONE TABLET BY MOUTH TWICE DAILY  180 tablet  2  . fluticasone (FLONASE) 50 MCG/ACT nasal spray       . isosorbide mononitrate (IMDUR) 30 MG 24 hr tablet TAKE 1 TABLET BY MOUTH ONCE DAILY  30 tablet  10  . levothyroxine (SYNTHROID, LEVOTHROID) 88 MCG tablet Take 88 mcg by mouth daily.      Marland Kitchen lisinopril (PRINIVIL,ZESTRIL) 5 MG tablet TAKE 1 TABLET BY MOUTH EVERY DAY  30 tablet  10  . nitroGLYCERIN (NITROSTAT) 0.4 MG SL tablet Place 0.4 mg under the tongue every 5 (five) minutes as needed  for chest pain.      . simvastatin (ZOCOR) 20 MG tablet Take 20 mg by mouth every evening. Managed by PCP, Dr. Delfina Redwood      . warfarin (COUMADIN) 5 MG tablet Take 5 mg by mouth daily. Managed by PCP, Dr. Delfina Redwood       No current facility-administered medications for this visit.    Allergies:  Allergies  Allergen Reactions  . Erythromycin Nausea Only    Past Medical History, Surgical history, Social history, and Family History were reviewed and updated.  Review of Systems:  Remaining ROS negative. Physical Exam:  Blood pressure 117/75, pulse 49, temperature 97.3 F (36.3 C), temperature source Oral, resp. rate 18, height 6\' 1"  (1.854 m), weight 196 lb 12.8 oz (89.268 kg), SpO2 98.00%. ECOG: 1 General appearance: Alert and oriented and appears in no active distress. Head: Normocephalic, atraumatic. Pupils equal, round, reactive to light. Oral mucosa moist and pink.  Neck: Supple without adenopathy.  Heart: Regular rate and rhythm. S1, S2.  Lungs: Clear to auscultation. No rhonchi, wheeze, no dullness to percussion.  Abdomen: Soft, nontender. No hepatosplenomegaly.  Extremities: No clubbing, cyanosis, or edema.  Neurologic: Intact motor, sensory and deep tendon reflexes.   Lab Results: Lab Results  Component Value Date   WBC 20.1* 01/23/2014   HGB 17.9* 01/23/2014   HCT 57.1* 01/23/2014   MCV 70.4* 01/23/2014   PLT 617* 01/23/2014  Chemistry      Component Value Date/Time   NA 139 11/19/2013 0851   NA 138 05/23/2012 0853   K 4.4 11/19/2013 0851   K 5.4* 05/23/2012 0853   CL 102 02/05/2013 1429   CL 101 05/23/2012 0853   CO2 26 11/19/2013 0851   CO2 28 05/23/2012 0853   BUN 19.2 11/19/2013 0851   BUN 22 05/23/2012 0853   CREATININE 1.3 11/19/2013 0851   CREATININE 1.26 05/23/2012 0853      Component Value Date/Time   CALCIUM 9.6 11/19/2013 0851   CALCIUM 9.4 05/23/2012 0853   ALKPHOS 89 11/19/2013 0851   ALKPHOS 85 08/27/2013 0808   AST 32 11/19/2013 0851   AST 25 08/27/2013  0808   ALT 20 11/19/2013 0851   ALT 17 08/27/2013 0808   BILITOT 0.53 11/19/2013 0851   BILITOT 0.6 08/27/2013 0808      Impression and Plan:  72 year old old with the following issues:  1. 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 His Hgb is increased at this time close to 18. I favor performing a phlebotomy today which I discussed with the patient and he is agreeable. We will performed a phlebotomy today and followup in 2-3 months for a reassessment.   2. Thrombosis prophylaxis: He is continued to be anticoagulated with Coumadin at low-dose aspirin.  Wyatt Portela, MD 4/17/20159:05 AM

## 2014-01-23 NOTE — Telephone Encounter (Signed)
Gave pt appt for July, date changed per pt rqst ok per md , emailed michelle reagrding phlebotomy

## 2014-01-23 NOTE — Telephone Encounter (Signed)
Per staff message and POF I have scheduled appts.  JMW  

## 2014-01-27 ENCOUNTER — Telehealth: Payer: Self-pay | Admitting: Oncology

## 2014-01-27 NOTE — Telephone Encounter (Signed)
Talked to pt, he is aware of appt on July 2015 lab,md and phlebotomy

## 2014-02-03 ENCOUNTER — Ambulatory Visit: Payer: Medicare Other | Admitting: Vascular Surgery

## 2014-02-03 ENCOUNTER — Encounter (HOSPITAL_COMMUNITY): Payer: Medicare Other

## 2014-02-16 ENCOUNTER — Encounter: Payer: Self-pay | Admitting: Vascular Surgery

## 2014-02-17 ENCOUNTER — Ambulatory Visit (HOSPITAL_COMMUNITY)
Admission: RE | Admit: 2014-02-17 | Discharge: 2014-02-17 | Disposition: A | Discharge status: Home / Self Care | Payer: Medicare Other | Source: Ambulatory Visit | Attending: Vascular Surgery | Admitting: Vascular Surgery

## 2014-02-17 ENCOUNTER — Ambulatory Visit (INDEPENDENT_AMBULATORY_CARE_PROVIDER_SITE_OTHER): Payer: Medicare Other | Admitting: Vascular Surgery

## 2014-02-17 ENCOUNTER — Encounter: Payer: Self-pay | Admitting: Vascular Surgery

## 2014-02-17 VITALS — BP 141/85 | HR 52 | Resp 16 | Ht 72.0 in | Wt 189.0 lb

## 2014-02-17 DIAGNOSIS — I83893 Varicose veins of bilateral lower extremities with other complications: Secondary | ICD-10-CM | POA: Insufficient documentation

## 2014-02-17 NOTE — Progress Notes (Signed)
Subjective:     Patient ID: Gregory Blankenship, male   DOB: Mar 16, 1941, 73 y.o.   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 He has previously had distal vein stripping many many years ago from the knee to the ankle the great saphenous vein. He has also had closure of the left great saphenous vein and 2011 with laser ablation and multiple stab phlebectomy. He now has recurrent symptomatic bulging varicosities throughout both lower extremities and also had an episode of thrombophlebitis in left medial thigh 3 months ago. He has been wearing only elastic compression stockings and trying ablation ibuprofen with no improvement in his symptoms. He has edema as the day progresses in both legs as well as pain numbness and tingling. He does have a history of pulmonary embolus in the past and is on chronic Coumadin therapy.  Past Medical History  Diagnosis Date  . Hypothyroid   . Pulmonary embolism   . Coronary artery disease   . Hypertension   . Hyperlipidemia   . Polycythemia   . Varicose veins     History  Substance Use Topics  . Smoking status: Former Smoker    Quit date: 12/01/1968  . Smokeless tobacco: Not on file  . Alcohol Use: 0.6 oz/week    1 Glasses of wine per week    Family History  Problem Relation Age of Onset  . Varicose Veins Mother     Allergies  Allergen Reactions  . Erythromycin Nausea Only    Current outpatient prescriptions:aspirin 81 MG tablet, Take 81 mg by mouth daily., Disp: , Rfl: ;  carvedilol (COREG) 6.25 MG tablet, TAKE ONE TABLET BY MOUTH TWICE DAILY, Disp: 180 tablet, Rfl: 2;  fluticasone (FLONASE) 50 MCG/ACT nasal spray, , Disp: , Rfl: ;  isosorbide mononitrate (IMDUR) 30 MG 24 hr tablet, TAKE 1 TABLET BY MOUTH ONCE DAILY, Disp: 30 tablet, Rfl: 10 levothyroxine (SYNTHROID, LEVOTHROID) 88 MCG tablet, Take 88 mcg by mouth daily., Disp: , Rfl: ;  nitroGLYCERIN  (NITROSTAT) 0.4 MG SL tablet, Place 0.4 mg under the tongue every 5 (five) minutes as needed for chest pain., Disp: , Rfl: ;  simvastatin (ZOCOR) 20 MG tablet, Take 20 mg by mouth every evening. Managed by PCP, Dr. Delfina Redwood, Disp: , Rfl: ;  warfarin (COUMADIN) 5 MG tablet, Take 5 mg by mouth daily. Managed by PCP, Dr. Delfina Redwood, Disp: , Rfl:  acetaminophen (TYLENOL) 325 MG tablet, Take 650 mg by mouth every 6 (six) hours as needed for pain., Disp: , Rfl: ;  CARAC 0.5 % cream, , Disp: , Rfl: ;  lisinopril (PRINIVIL,ZESTRIL) 5 MG tablet, TAKE 1 TABLET BY MOUTH EVERY DAY, Disp: 30 tablet, Rfl: 10  BP 141/85  Pulse 52  Resp 16  Ht 6' (1.829 m)  Wt 189 lb (85.73 kg)  BMI 25.63 kg/m2  Body mass index is 25.63 kg/(m^2).           Review of Systems denies chest pain, dyspnea on exertion, PND, orthopnea, hemoptysis.    Objective:   Physical Exam BP 141/85  Pulse 52  Resp 16  Ht 6' (1.829 m)  Wt 189 lb (85.73 kg)  BMI 25.63 kg/m2  General well-developed well-nourished male no apparent stress alert and oriented x3 Lungs no rhonchi or wheezing Both legs with extensive ropey type varicosities extending down the anterior and lateral thigh into the lateral and medial calf on the right and in  the mid medial thigh and medial calf and lateral calf on the left with 1+ edema. No active ulcerations noted.  Today I performed a venous duplex exam of the right lower extremity to read and interpreted. There is gross reflux throughout the native right great saphenous vein from the mid thigh to the saphenofemoral junction with a large caliber vein supplying these varicosities. There is also reflux in the right small saphenous vein but it is a smaller caliber vessel. There is no DVT. Previous study has revealed gross reflux in the left great saphenous vein anterior accessory branch. This is a large caliber vein which is supplying the varicosities on the left. The left great saphenous vein itself is successfully  closed from previous ablation therapy     Assessment:     Severe symptomatic recurrent bulging varicosities both lower extremities resistant to conservative measures and continuing to progress-affecting patient's daily living History of pulmonary embolus following prostate cancer surgery-on chronic Coumadin therapy managed by Dr. polite    Plan:     Patient needs #1 laser ablation anterior accessory branch left great saphenous vein followed by a #2 laser ablation right great saphenous vein. We will then wait 3 months and determine if stab phlebectomy of these extensive large bulging varicosities will be indicated. Will need to discontinue Coumadin about one week prior to surgery and bridge with Lovenox if felt necessary by Dr. Cleaster Corin touch base with his office after procedure has been approved by the insurance company

## 2014-02-25 ENCOUNTER — Other Ambulatory Visit: Payer: Self-pay | Admitting: *Deleted

## 2014-02-25 DIAGNOSIS — I83893 Varicose veins of bilateral lower extremities with other complications: Secondary | ICD-10-CM

## 2014-03-13 ENCOUNTER — Encounter: Payer: Self-pay | Admitting: Vascular Surgery

## 2014-03-16 ENCOUNTER — Encounter: Payer: Self-pay | Admitting: Vascular Surgery

## 2014-03-16 ENCOUNTER — Ambulatory Visit (INDEPENDENT_AMBULATORY_CARE_PROVIDER_SITE_OTHER): Payer: Medicare Other | Admitting: Vascular Surgery

## 2014-03-16 VITALS — BP 114/70 | HR 58 | Resp 16 | Ht 72.0 in | Wt 189.0 lb

## 2014-03-16 DIAGNOSIS — I83893 Varicose veins of bilateral lower extremities with other complications: Secondary | ICD-10-CM

## 2014-03-16 NOTE — Progress Notes (Signed)
Subjective:     Patient ID: Gregory Blankenship, male   DOB: 1940-10-30, 73 y.o.   MRN: [id number]  HPI this 73 year old male had laser ablation of the anterior accessory branch of the left great saphenous vein from the distal 5 to near the saphenofemoral junction performed under local tumescent anesthesia for painful recurrent varicosities. A total of 2050 J of energy was utilized. He tolerated the procedure well.  Review of Systems     Objective:   Physical Exam BP 114/70  Pulse 58  Resp 16  Ht 6' (1.829 m)  Wt 189 lb (85.73 kg)  BMI 25.63 kg/m2       Assessment:     Well-tolerated laser ablation anterior chest for branch left great saphenous vein performed under local tumescent anesthesia    Plan:     Return in one week for a venous duplex exam to confirm closure left great saphenous vein-anterior accessory branch Patient will resume his Lovenox Wednesday, June 10 Procedure on contralateral right leg will be Monday, June 29

## 2014-03-16 NOTE — Progress Notes (Signed)
   Laser Ablation Procedure      Date: 03/16/2014    Gregory Blankenship DOB:[birth year]  Consent signed: Yes  Surgeon:J.D. Kellie Simmering  Procedure: Laser Ablation: left Greater Saphenous Vein  BP 114/70  Pulse 58  Resp 16  Ht 6' (1.829 m)  Wt 189 lb (85.73 kg)  BMI 25.63 kg/m2  Start time: 3:10   End time: 3:45  Tumescent Anesthesia: 300 cc 0.9% NaCl with 50 cc Lidocaine HCL with 1% Epi and 15 cc 8.4% NaHCO3  Local Anesthesia: 5 cc Lidocaine HCL and NaHCO3 (ratio 2:1)  Pulsed mode: 15, 500 ms delay, 1.0 duration Total energy: 1.058, total pulses: 71, total time: 1:10    Patient tolerated procedure well: Yes  Notes:   Description of Procedure:  After marking the course of the saphenous vein and the secondary varicosities in the standing position, the patient was placed on the operating table in the supine position, and the left leg was prepped and draped in sterile fashion. Local anesthetic was administered, and under ultrasound guidance the saphenous vein was accessed with a micro needle and guide wire; then the micro puncture sheath was placed. A guide wire was inserted to the saphenofemoral junction, followed by a 5 french sheath.  The position of the sheath and then the laser fiber below the junction was confirmed using the ultrasound and visualization of the aiming beam.  Tumescent anesthesia was administered along the course of the saphenous vein using ultrasound guidance. Protective laser glasses were placed on the patient, and the laser was fired at 15 watt pulsed mode advancing 1-2 mm per sec.  For a total of 1.058 joules.  A steri strip was applied to the puncture site.  .  ABD pads and thigh high compression stockings were applied.  Ace wrap bandages were applied over the phlebectomy sites and at the top of the saphenofemoral junction.  Blood loss was less than 15 cc.  The patient ambulated out of the operating room having tolerated the procedure well.

## 2014-03-17 ENCOUNTER — Encounter: Payer: Self-pay | Admitting: Vascular Surgery

## 2014-03-17 ENCOUNTER — Telehealth: Payer: Self-pay | Admitting: *Deleted

## 2014-03-17 NOTE — Telephone Encounter (Signed)
Pt doing very well. No pain. Following all instructions. Will see him next Monday for his fu appt.

## 2014-03-20 ENCOUNTER — Encounter: Payer: Self-pay | Admitting: Vascular Surgery

## 2014-03-23 ENCOUNTER — Encounter: Payer: Self-pay | Admitting: Vascular Surgery

## 2014-03-23 ENCOUNTER — Ambulatory Visit (HOSPITAL_COMMUNITY)
Admission: RE | Admit: 2014-03-23 | Discharge: 2014-03-23 | Disposition: A | Discharge status: Home / Self Care | Payer: Medicare Other | Source: Ambulatory Visit | Attending: Vascular Surgery | Admitting: Vascular Surgery

## 2014-03-23 ENCOUNTER — Ambulatory Visit (INDEPENDENT_AMBULATORY_CARE_PROVIDER_SITE_OTHER): Payer: Self-pay | Admitting: Vascular Surgery

## 2014-03-23 VITALS — BP 120/74 | HR 56 | Resp 16 | Ht 72.0 in | Wt 190.0 lb

## 2014-03-23 DIAGNOSIS — I83893 Varicose veins of bilateral lower extremities with other complications: Secondary | ICD-10-CM | POA: Insufficient documentation

## 2014-03-23 NOTE — Progress Notes (Signed)
Subjective:     Patient ID: Gregory Blankenship, male   DOB: 01-21-1941, 73 y.o.   MRN: [id number]  HPI this 73 year old male returns 1 week post laser ablation of the left great saphenous vein-anterior accessory branch. 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 He states that the left ankle and foot area are already less swollen and less heavy and tired feeling. He states the bulging is not as tense as before. He has noted some bruising in the left thigh. He is on Lovenox injections while his Coumadin is on hold because he has an upcoming procedure on the right leg 7. He is wearing long leg elastic compression markings.  Past Medical History  Diagnosis Date  . Hypothyroid   . Pulmonary embolism   . Coronary artery disease   . Hypertension   . Hyperlipidemia   . Polycythemia   . Varicose veins     History  Substance Use Topics  . Smoking status: Former Smoker    Quit date: 12/01/1968  . Smokeless tobacco: Not on file  . Alcohol Use: 0.6 oz/week    1 Glasses of wine per week    Family History  Problem Relation Age of Onset  . Varicose Veins Mother     Allergies  Allergen Reactions  . Erythromycin Nausea Only    Current outpatient prescriptions:acetaminophen (TYLENOL) 325 MG tablet, Take 650 mg by mouth every 6 (six) hours as needed for pain., Disp: , Rfl: ;  aspirin 81 MG tablet, Take 81 mg by mouth daily., Disp: , Rfl: ;  CARAC 0.5 % cream, , Disp: , Rfl: ;  carvedilol (COREG) 6.25 MG tablet, TAKE ONE TABLET BY MOUTH TWICE DAILY, Disp: 180 tablet, Rfl: 2;  fluticasone (FLONASE) 50 MCG/ACT nasal spray, , Disp: , Rfl:  isosorbide mononitrate (IMDUR) 30 MG 24 hr tablet, TAKE 1 TABLET BY MOUTH ONCE DAILY, Disp: 30 tablet, Rfl: 10;  levothyroxine (SYNTHROID, LEVOTHROID) 88 MCG tablet, Take 88 mcg by mouth daily., Disp: , Rfl: ;  lisinopril (PRINIVIL,ZESTRIL) 5 MG tablet, TAKE 1 TABLET BY MOUTH EVERY DAY, Disp: 30 tablet, Rfl: 10;  nitroGLYCERIN (NITROSTAT) 0.4 MG SL  tablet, Place 0.4 mg under the tongue every 5 (five) minutes as needed for chest pain., Disp: , Rfl:  simvastatin (ZOCOR) 20 MG tablet, Take 20 mg by mouth every evening. Managed by PCP, Dr. Delfina Redwood, Disp: , Rfl: ;  warfarin (COUMADIN) 5 MG tablet, Take 5 mg by mouth daily. Managed by PCP, Dr. Delfina Redwood, Disp: , Rfl:   BP 120/74  Pulse 56  Resp 16  Ht 6' (1.829 m)  Wt 190 lb (86.183 kg)  BMI 25.76 kg/m2  Body mass index is 25.76 kg/(m^2).           Review of Systems denies chest pain, dyspnea on exertion, PND, orthopnea, hemoptysis.    Objective:   Physical Exam BP 120/74  Pulse 56  Resp 16  Ht 6' (1.829 m)  Wt 190 lb (86.183 kg)  BMI 25.76 kg/m2  General well-developed well-nourished male no apparent stress alert and oriented x3 Left leg with bruising in the medial proximal thigh but no hematoma. 3+ dorsalis pedis pulse palpable. Minimal distal edema with bulging varicosities and medial thigh calf and ankle areas.  Today I ordered a venous duplex exam of the left leg which are viewed and interpret. The anterior accessory branch of the left great saphenous vein was totally closed up to about 1.5 cm from the saphenofemoral junction and  there is no DVT      Assessment:     Successful laser ablation anterior accessory branch left great saphenous vein    Plan:     Patient will return on June 29-2 weeks for laser ablation right great saphenous vein. We'll continue daily Lovenox injections and interim Will need stab phlebectomy was which are scheduled for August in both [place]

## 2014-04-03 ENCOUNTER — Encounter: Payer: Self-pay | Admitting: Vascular Surgery

## 2014-04-06 ENCOUNTER — Encounter: Payer: Self-pay | Admitting: Vascular Surgery

## 2014-04-06 ENCOUNTER — Ambulatory Visit (INDEPENDENT_AMBULATORY_CARE_PROVIDER_SITE_OTHER): Payer: Medicare Other | Admitting: Vascular Surgery

## 2014-04-06 VITALS — BP 118/76 | HR 52 | Resp 16 | Ht 73.0 in | Wt 193.0 lb

## 2014-04-06 DIAGNOSIS — I83893 Varicose veins of bilateral lower extremities with other complications: Secondary | ICD-10-CM

## 2014-04-06 NOTE — Progress Notes (Signed)
Subjective:     Patient ID: Gregory Blankenship, male   DOB: 07/17/1941, 73 y.o.   MRN: [id number]  HPI this 73 year old male had laser ablation right great saphenous vein from the mid to distal thigh to near the saphenofemoral junction performed under local tumescent anesthesia for painful callosities due to gross reflux. A total of 1350 J of energy was utilized. He tolerated the procedure well.   Review of Systems     Objective:   Physical Exam BP 118/76  Pulse 52  Resp 16  Ht 6\' 1"  (1.854 m)  Wt 193 lb (87.544 kg)  BMI 25.47 kg/m2       Assessment:     Well-tolerated laser ablation right great saphenous vein performed under local tumescent anesthesia for painful varicosities    Plan:     Return in one week for a venous duplex exam to confirm closure right great saphenous vein Patient will resume normal Coumadin dose tonight Will resume Lovenox injections in 48 hours and continue this until advised by Dr. polite that Coumadin is under good control

## 2014-04-06 NOTE — Progress Notes (Signed)
   Laser Ablation Procedure      Date: 04/06/2014    Gregory Blankenship DOB:[birth year]  Consent signed: Yes  Surgeon:J.D. Kellie Simmering  Procedure: Laser Ablation: right Greater Saphenous Vein  BP 118/76  Pulse 52  Resp 16  Ht 6\' 1"  (1.854 m)  Wt 193 lb (87.544 kg)  BMI 25.47 kg/m2  Start time: 9:10   End time: 9:40  Tumescent Anesthesia: 200 cc 0.9% NaCl with 50 cc Lidocaine HCL with 1% Epi and 15 cc 8.4% NaHCO3  Local Anesthesia: 3 cc Lidocaine HCL and NaHCO3 (ratio 2:1)  Pulsed mode: 15 watts, 500 ms delay, 1.0 duration Total energy: 1376, total pulses: 92, total time: 1:31     Patient tolerated procedure well: Yes  Notes:   Description of Procedure:  After marking the course of the saphenous vein and the secondary varicosities in the standing position, the patient was placed on the operating table in the supine position, and the right leg was prepped and draped in sterile fashion. Local anesthetic was administered, and under ultrasound guidance the saphenous vein was accessed with a micro needle and guide wire; then the micro puncture sheath was placed. A guide wire was inserted to the saphenofemoral junction, followed by a 5 french sheath.  The position of the sheath and then the laser fiber below the junction was confirmed using the ultrasound and visualization of the aiming beam.  Tumescent anesthesia was administered along the course of the saphenous vein using ultrasound guidance. Protective laser glasses were placed on the patient, and the laser was fired at 15 watt pulsed mode advancing 1-2 mm per sec.  For a total of 1375 joules.  A steri strip was applied to the puncture site.    ABD pads and thigh high compression stockings were applied.  Ace wrap bandages were applied over the phlebectomy sites and at the top of the saphenofemoral junction.  Blood loss was less than 15 cc.  The patient ambulated out of the operating room having tolerated the procedure well.

## 2014-04-07 ENCOUNTER — Telehealth: Payer: Self-pay | Admitting: *Deleted

## 2014-04-07 NOTE — Telephone Encounter (Signed)
Pt doing well except he has broken out in hives. He is calling Dr. Delfina Redwood to see if he can take Benadryl. Otherwise doing well.

## 2014-04-09 ENCOUNTER — Encounter: Payer: Self-pay | Admitting: Vascular Surgery

## 2014-04-13 ENCOUNTER — Encounter: Payer: Self-pay | Admitting: Vascular Surgery

## 2014-04-13 ENCOUNTER — Ambulatory Visit (HOSPITAL_COMMUNITY)
Admission: RE | Admit: 2014-04-13 | Discharge: 2014-04-13 | Disposition: A | Discharge status: Home / Self Care | Payer: Medicare Other | Source: Ambulatory Visit | Attending: Vascular Surgery | Admitting: Vascular Surgery

## 2014-04-13 ENCOUNTER — Ambulatory Visit (INDEPENDENT_AMBULATORY_CARE_PROVIDER_SITE_OTHER): Payer: Medicare Other | Admitting: Vascular Surgery

## 2014-04-13 VITALS — BP 124/75 | HR 56 | Resp 16 | Ht 73.0 in | Wt 188.0 lb

## 2014-04-13 DIAGNOSIS — I83893 Varicose veins of bilateral lower extremities with other complications: Secondary | ICD-10-CM | POA: Insufficient documentation

## 2014-04-13 NOTE — Progress Notes (Signed)
Subjective:     Patient ID: Gregory Blankenship, male   DOB: July 05, 1941, 73 y.o.   MRN: [id number]  HPI this 73 year old male returns 1 week post laser ablation right great saphenous vein from distal thigh to near the saphenofemoral junction. He denies any worsening edema. He has one was longer elastic compression stocking and taken ibuprofen as instructed. He has  currently resumed his Coumadin daily dose. He has no specific complaints.  Past Medical History  Diagnosis Date  . Hypothyroid   . Pulmonary embolism   . Coronary artery disease   . Hypertension   . Hyperlipidemia   . Polycythemia   . Varicose veins     History  Substance Use Topics  . Smoking status: Former Smoker    Quit date: 12/01/1968  . Smokeless tobacco: Not on file  . Alcohol Use: 0.6 oz/week    1 Glasses of wine per week    Family History  Problem Relation Age of Onset  . Varicose Veins Mother     Allergies  Allergen Reactions  . Erythromycin Nausea Only    Current outpatient prescriptions:acetaminophen (TYLENOL) 325 MG tablet, Take 650 mg by mouth every 6 (six) hours as needed for pain., Disp: , Rfl: ;  aspirin 81 MG tablet, Take 81 mg by mouth daily., Disp: , Rfl: ;  CARAC 0.5 % cream, , Disp: , Rfl: ;  carvedilol (COREG) 6.25 MG tablet, TAKE ONE TABLET BY MOUTH TWICE DAILY, Disp: 180 tablet, Rfl: 2;  fluticasone (FLONASE) 50 MCG/ACT nasal spray, , Disp: , Rfl:  isosorbide mononitrate (IMDUR) 30 MG 24 hr tablet, TAKE 1 TABLET BY MOUTH ONCE DAILY, Disp: 30 tablet, Rfl: 10;  levothyroxine (SYNTHROID, LEVOTHROID) 88 MCG tablet, Take 88 mcg by mouth daily., Disp: , Rfl: ;  lisinopril (PRINIVIL,ZESTRIL) 5 MG tablet, TAKE 1 TABLET BY MOUTH EVERY DAY, Disp: 30 tablet, Rfl: 10;  nitroGLYCERIN (NITROSTAT) 0.4 MG SL tablet, Place 0.4 mg under the tongue every 5 (five) minutes as needed for chest pain., Disp: , Rfl:  simvastatin (ZOCOR) 20 MG tablet, Take 20 mg by mouth every evening. Managed by PCP, Dr. Delfina Redwood, Disp: , Rfl:  ;  warfarin (COUMADIN) 5 MG tablet, Take 5 mg by mouth daily. Managed by PCP, Dr. Delfina Redwood, Disp: , Rfl:   There were no vitals taken for this visit.  There is no weight on file to calculate BMI.           Review of Systems denies chest pain, dyspnea on exertion, PND, orthopnea, hemoptysis, claudication.     Objective:   Physical Exam There were no vitals taken for this visit.  . General well-developed well-nourished male in no apparent distress alert and oriented x3 Lungs no rhonchi or wheezing Right lower extremity-extensive network of varicosities and anterior thigh extending laterally and medially and the calf with no distal edema. 3+ dorsalis pedis pulse palpable.  Today I ordered a venous duplex exam the right leg which are reviewed and interpreted. The great saphenous vein is totally closed from mid thigh to near the saphenofemoral junction with no DVT.      Assessment:    successful laser ablation right great saphenous vein for gross reflux with no DVT    Plan:     #1 continue long-leg elastic compression stockings #2 patient is currently on Coumadin and will discontinue this 5 days prior to next procedure on August 3 #3 he will begin Lovenox bridge which he will discontinue on the morning of Sunday, August  2 and resume 48-hour to post procedure

## 2014-04-15 ENCOUNTER — Telehealth: Payer: Self-pay | Admitting: Oncology

## 2014-04-15 NOTE — Telephone Encounter (Signed)
MOVED 7/10 APPT TO 7/17 DUE TO PMDC. S/W WIFE RE CHANGE AND PT OUT OF TOWN WK OF 7/17. DUE TO A CANCELLATION APPTS WERE ABLE TO BE MOVED BACK TO 7/10 IN THE PM. WIFE HAS NEW TIME FOR LB/FU/TX 7/10 @ 1:15PM.

## 2014-04-17 ENCOUNTER — Other Ambulatory Visit (HOSPITAL_BASED_OUTPATIENT_CLINIC_OR_DEPARTMENT_OTHER): Payer: Medicare Other

## 2014-04-17 ENCOUNTER — Ambulatory Visit (HOSPITAL_BASED_OUTPATIENT_CLINIC_OR_DEPARTMENT_OTHER): Payer: Medicare Other | Admitting: Oncology

## 2014-04-17 ENCOUNTER — Other Ambulatory Visit: Payer: Medicare Other

## 2014-04-17 ENCOUNTER — Encounter: Payer: Self-pay | Admitting: Oncology

## 2014-04-17 ENCOUNTER — Telehealth: Payer: Self-pay | Admitting: *Deleted

## 2014-04-17 ENCOUNTER — Telehealth: Payer: Self-pay | Admitting: Oncology

## 2014-04-17 ENCOUNTER — Ambulatory Visit: Payer: Medicare Other | Admitting: Oncology

## 2014-04-17 VITALS — BP 103/65 | HR 59 | Temp 97.2°F | Resp 18 | Ht 73.0 in | Wt 195.1 lb

## 2014-04-17 DIAGNOSIS — D45 Polycythemia vera: Secondary | ICD-10-CM

## 2014-04-17 DIAGNOSIS — D72829 Elevated white blood cell count, unspecified: Secondary | ICD-10-CM

## 2014-04-17 DIAGNOSIS — I2699 Other pulmonary embolism without acute cor pulmonale: Secondary | ICD-10-CM

## 2014-04-17 LAB — CBC WITH DIFFERENTIAL/PLATELET
BASO%: 0 % (ref 0.0–2.0)
BASOS ABS: 0 10*3/uL (ref 0.0–0.1)
EOS%: 3.3 % (ref 0.0–7.0)
Eosinophils Absolute: 0.7 10*3/uL — ABNORMAL HIGH (ref 0.0–0.5)
HEMATOCRIT: 53 % — AB (ref 38.4–49.9)
HEMOGLOBIN: 16.6 g/dL (ref 13.0–17.1)
LYMPH%: 9.6 % — ABNORMAL LOW (ref 14.0–49.0)
MCH: 22.3 pg — AB (ref 27.2–33.4)
MCHC: 31.4 g/dL — AB (ref 32.0–36.0)
MCV: 71 fL — AB (ref 79.3–98.0)
MONO#: 2.7 10*3/uL — ABNORMAL HIGH (ref 0.1–0.9)
MONO%: 12 % (ref 0.0–14.0)
NEUT#: 16.5 10*3/uL — ABNORMAL HIGH (ref 1.5–6.5)
NEUT%: 75.1 % — AB (ref 39.0–75.0)
Platelets: 530 10*3/uL — ABNORMAL HIGH (ref 140–400)
RBC: 7.46 10*6/uL — ABNORMAL HIGH (ref 4.20–5.82)
RDW: 17.1 % — ABNORMAL HIGH (ref 11.0–14.6)
WBC: 22 10*3/uL — ABNORMAL HIGH (ref 4.0–10.3)
lymph#: 2.1 10*3/uL (ref 0.9–3.3)

## 2014-04-17 LAB — COMPREHENSIVE METABOLIC PANEL (CC13)
ALT: 27 U/L (ref 0–55)
AST: 28 U/L (ref 5–34)
Albumin: 4 g/dL (ref 3.5–5.0)
Alkaline Phosphatase: 85 U/L (ref 40–150)
Anion Gap: 9 mEq/L (ref 3–11)
BILIRUBIN TOTAL: 0.57 mg/dL (ref 0.20–1.20)
BUN: 21.8 mg/dL (ref 7.0–26.0)
CO2: 26 mEq/L (ref 22–29)
CREATININE: 1.4 mg/dL — AB (ref 0.7–1.3)
Calcium: 9.3 mg/dL (ref 8.4–10.4)
Chloride: 103 mEq/L (ref 98–109)
Glucose: 91 mg/dl (ref 70–140)
Potassium: 4.6 mEq/L (ref 3.5–5.1)
Sodium: 138 mEq/L (ref 136–145)
Total Protein: 7 g/dL (ref 6.4–8.3)

## 2014-04-17 LAB — TECHNOLOGIST REVIEW

## 2014-04-17 NOTE — Progress Notes (Signed)
Hematology and Oncology Follow Up Visit  Gregory Blankenship [id number] Jan 06, 1941 73 y.o. 04/17/2014 1:52 PM   Principle Diagnosis: 73 year old gentleman with polycythemia diagnosed February of 2013 presented with a hemoglobin of 18. 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 He is also history of coronary arteries disease had a pulmonary embolus.  Current therapy:  Phlebotomy to keep his Hgb close to 17.   Interim History: [name] presents today for a followup visit. Since his last visit, he is doing well without any major changes in his clinical status. He had not had any bleeding complications he had not had any thrombotic events he is continued to be anticoagulated with Coumadin and aspirin. He is very active and exercise regularly. He is not reporting any headaches, chest pain or difficulty breathing. He was noted to have elevated potassium on recent laboratory data and his lisinopril was discontinued which helped his potassium at this time. He also reported evening time dry cough that also have resolved. He has not reported any fevers chills or sweats. Has not reported any nausea or vomiting. Has not reported any syncope or alteration of mental status. As that report any frequency urgency or hesitancy. Rest of his review of systems unremarkable.  Medications: I have reviewed the patient's current medications. Current Outpatient Prescriptions  Medication Sig Dispense Refill  . acetaminophen (TYLENOL) 325 MG tablet Take 650 mg by mouth every 6 (six) hours as needed for pain.      Marland Kitchen aspirin 81 MG tablet Take 81 mg by mouth daily.      . carvedilol (COREG) 6.25 MG tablet TAKE ONE TABLET BY MOUTH TWICE DAILY  180 tablet  2  . isosorbide mononitrate (IMDUR) 30 MG 24 hr tablet TAKE 1 TABLET BY MOUTH ONCE DAILY  30 tablet  10  . levothyroxine (SYNTHROID, LEVOTHROID) 88 MCG tablet Take  88 mcg by mouth daily.      . nitroGLYCERIN (NITROSTAT) 0.4 MG SL tablet Place 0.4 mg under the tongue every 5 (five) minutes as needed for chest pain.      . simvastatin (ZOCOR) 20 MG tablet Take 20 mg by mouth every evening. Managed by PCP, Dr. Delfina Redwood      . warfarin (COUMADIN) 5 MG tablet Take 5 mg by mouth daily. Managed by PCP, Dr. Delfina Redwood       No current facility-administered medications for this visit.    Allergies:  Allergies  Allergen Reactions  . Erythromycin Nausea Only    Past Medical History, Surgical history, Social history, and Family History were reviewed and updated.   Physical Exam:  Blood pressure 103/65, pulse 59, temperature 97.2 F (36.2 C), temperature source Oral, resp. rate 18, height 6\' 1"  (1.854 m), weight 195 lb 1.6 oz (88.497 kg), SpO2 100.00%. ECOG: 1 General appearance: Alert and oriented and appears in no active distress. Head: Normocephalic, atraumatic. Pupils equal, round, reactive to light. Oral mucosa moist and pink.  Neck: Supple without adenopathy.  Heart: Regular rate and rhythm. S1, S2.  Lungs: Clear to auscultation. No rhonchi, wheeze, no dullness to percussion.  Abdomen: Soft, nontender. No hepatosplenomegaly. Good bowel sounds. Extremities: No clubbing, cyanosis, or edema.  Neurologic: No deficits noted.   Lab Results: Lab Results  Component Value Date   WBC 22.0* 04/17/2014   HGB 16.6 04/17/2014   HCT 53.0* 04/17/2014   MCV 71.0* 04/17/2014   PLT 530* 04/17/2014  Chemistry      Component Value Date/Time   NA 142 01/23/2014 0821   NA 138 05/23/2012 0853   K 4.7 01/23/2014 0821   K 5.4* 05/23/2012 0853   CL 102 02/05/2013 1429   CL 101 05/23/2012 0853   CO2 27 01/23/2014 0821   CO2 28 05/23/2012 0853   BUN 24.3 01/23/2014 0821   BUN 22 05/23/2012 0853   CREATININE 1.3 01/23/2014 0821   CREATININE 1.26 05/23/2012 0853      Component Value Date/Time   CALCIUM 9.5 01/23/2014 0821   CALCIUM 9.4 05/23/2012 0853   ALKPHOS 93 01/23/2014  0821   ALKPHOS 85 08/27/2013 0808   AST 28 01/23/2014 0821   AST 25 08/27/2013 0808   ALT 21 01/23/2014 0821   ALT 17 08/27/2013 0808   BILITOT 0.55 01/23/2014 0821   BILITOT 0.6 08/27/2013 0808      Impression and Plan:  73 year old old with the following issues:  1. 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 His Hgb today is under reasonable control and plan to hold off on any phlebotomy at this time. We will repeat his counts in 3 months and proceed with phlebotomy if his hemoglobin of 17.   2. Thrombosis prophylaxis: He is continued to be anticoagulated with Coumadin at low-dose aspirin.  Zola Button, MD 7/10/20151:52 PM

## 2014-04-17 NOTE — Telephone Encounter (Signed)
Gave pt appt for lab,md and phlebotomy for october 2015

## 2014-04-17 NOTE — Telephone Encounter (Signed)
Per staff phone call and POF I have schedueld appts. Scheduler advised of appts.  JMW  

## 2014-04-24 ENCOUNTER — Other Ambulatory Visit: Payer: Medicare Other

## 2014-04-24 ENCOUNTER — Ambulatory Visit: Payer: Medicare Other | Admitting: Oncology

## 2014-04-27 ENCOUNTER — Encounter: Payer: Self-pay | Admitting: Vascular Surgery

## 2014-05-08 ENCOUNTER — Encounter: Payer: Self-pay | Admitting: Vascular Surgery

## 2014-05-11 ENCOUNTER — Ambulatory Visit (INDEPENDENT_AMBULATORY_CARE_PROVIDER_SITE_OTHER): Payer: Medicare Other | Admitting: Vascular Surgery

## 2014-05-11 ENCOUNTER — Encounter: Payer: Self-pay | Admitting: Vascular Surgery

## 2014-05-11 VITALS — BP 149/93 | HR 56 | Resp 16 | Ht 78.0 in | Wt 188.0 lb

## 2014-05-11 DIAGNOSIS — I83893 Varicose veins of bilateral lower extremities with other complications: Secondary | ICD-10-CM

## 2014-05-11 NOTE — Progress Notes (Signed)
Subjective:     Patient ID: Gregory Blankenship, male   DOB: [birth year], 73 y.o.   MRN: [id number]  HPI S. 73 year old male had approximately 30 stab phlebectomy of painful varicosities performed in the left lower extremity under local tumescent anesthesia for painful varicosities. He tolerated the excision well.   Review of Systems     Objective:   Physical Exam BP 149/93  Pulse 56  Resp 16  Ht 6\' 6"  (1.981 m)  Wt 188 lb (85.276 kg)  BMI 21.73 kg/m2       Assessment:     Greater than 20 stab phlebectomy of painful varicosities left leg performed under local tumescent anesthesia    Plan:     Return next week for identical procedure on the contralateral right leg

## 2014-05-11 NOTE — Progress Notes (Signed)
   Laser Ablation Procedure      Date: 05/11/2014    MERLE CIRELLI DOB:Aug 18, 1941  Consent signed: Yes  Surgeon:J.D. Kellie Simmering  Procedure: Stab Phlebs left leg  BP 149/93  Pulse 56  Resp 16  Ht 6\' 6"  (1.981 m)  Wt 188 lb (85.276 kg)  BMI 21.73 kg/m2  Start time: 9:10   End time: 10:10  Tumescent Anesthesia: 200 cc 0.9% NaCl with 50 cc Lidocaine HCL with 1% Epi and 15 cc 8.4% NaHCO3  Local Anesthesia: 8 cc Lidocaine HCL and NaHCO3 (ratio 2:1)       Stab Phlebectomy: >20 Sites: Calf  Patient tolerated procedure well: Yes  Notes:   Description of Procedure:    The patient was then put into Trendelenburg position.  Local anesthetic was utilized overlying the marked varicosities.  Greater than 20 stab wounds were made using the tip of an 11 blade; and using the vein hook,  The phlebectomies were performed using a hemostat to avulse these varicosities.  Adequate hemostasis was achieved, and steri strips were applied to the stab wound.      ABD pads and thigh high compression stockings were applied.  Ace wrap bandages were applied over the phlebectomy sites.  Blood loss was less than 15 cc.  The patient ambulated out of the operating room having tolerated the procedure well.

## 2014-05-12 ENCOUNTER — Telehealth: Payer: Self-pay | Admitting: *Deleted

## 2014-05-12 NOTE — Telephone Encounter (Signed)
Laft a message asking him to call for for an update on how he is doing.

## 2014-05-13 ENCOUNTER — Encounter: Payer: Self-pay | Admitting: Vascular Surgery

## 2014-05-14 ENCOUNTER — Telehealth: Payer: Self-pay | Admitting: *Deleted

## 2014-05-14 NOTE — Telephone Encounter (Signed)
Gregory Blankenship states that he experienced bleeding from stab phlebectomy on left leg.  Stated he has bleeding stopped but wanted to make [facility] office aware as he is on Lovenox.  Stated he was standing for prolonged period this morning and was wondering if this may have caused this episode.  Reminded Gregory Blankenship that if bleeding reoccurs, to lie down and put leg above the level of the heart, apply direct compression until bleeding stops, and then apply pad and ace wrap over the site for added compression.  Gregory Blankenship verbalized understanding.

## 2014-05-14 NOTE — Telephone Encounter (Signed)
Mr. Gregory Blankenship states that the left leg stab phlebectomy site below his knee lateral to his shin is continuing to ooze and bleed and has bled through an ABD pad and ace wrap despite elevating his leg and direct compression.  Advised Mr. Gregory Blankenship to come to [facility] to be assessed. Mr. Gregory Blankenship states his wife will drive him to [facility].    4:50PM Mr. Gregory Blankenship arrived at [facility] and saturated ABD pad and Ace wrap were observed on left calf upon admission.  Laid him down on OR bed and placed him in severe Trendelenburg position.  Removed pads and Ace wraps and cleaned left leg.  Applied direct compression to oozing stab phlebectomy incision on left calf for 10 minutes.  Bleeding was stopped.  Applied double steri strips to incisions and topped with Xeroform gauze.  Placed ABD pads over left calf and applied thigh high compression hose 20-30 mm HG on left leg.  Topped compression hose with double layer of ABD pads and wrapped Ace bandage over the top of dressing.  No further bleeding noted.  Instructed him to keep compression dressing on until Monday, to keep left leg elevated, and to limit himself to quiet ,calm activities until Monday.  Reviewed steps of how to stop bleeding if it should reoccur and sent him home with extra ABD pads and Ace wrap. At 5:30PM , Mr. Gregory Blankenship taken to his car via wheelchair and placed in back seat.  Mr. Gregory Blankenship will call [facility] if bleeding reoccurs or if he has further questions or concerns.

## 2014-05-15 ENCOUNTER — Encounter: Payer: Self-pay | Admitting: Vascular Surgery

## 2014-05-15 ENCOUNTER — Ambulatory Visit (INDEPENDENT_AMBULATORY_CARE_PROVIDER_SITE_OTHER): Payer: Medicare Other | Admitting: *Deleted

## 2014-05-15 DIAGNOSIS — I83893 Varicose veins of bilateral lower extremities with other complications: Secondary | ICD-10-CM

## 2014-05-15 NOTE — Progress Notes (Signed)
Mr. Hemphill called me this morning. He has not seen any new bleeding at the inner left knee area but feels like the ace wrap is too tight. His foot is red. He has had it elevated during the night and would like loosen the ace wrap. He came to the office and I unwrapped the ace. There was no new bleeding on the abd pad just a small circle of old dry blood. The hematoma area is hard (no longer soft). All steri strips are intact. I removed a few extra abd's and rewrapped the ace over the stocking. I instructed him to leave the stocking and ace on until Sunday. He can take a shower then and just wear the stocking if there is no new bleeding. We are to see him Monday for his right leg stab phlebectomy procedure.

## 2014-05-18 ENCOUNTER — Ambulatory Visit (INDEPENDENT_AMBULATORY_CARE_PROVIDER_SITE_OTHER): Payer: Medicare Other | Admitting: Vascular Surgery

## 2014-05-18 ENCOUNTER — Encounter: Payer: Self-pay | Admitting: Vascular Surgery

## 2014-05-18 VITALS — BP 127/83 | HR 59 | Resp 16 | Ht 72.0 in | Wt 186.0 lb

## 2014-05-18 DIAGNOSIS — I83893 Varicose veins of bilateral lower extremities with other complications: Secondary | ICD-10-CM

## 2014-05-18 NOTE — Progress Notes (Signed)
Subjective:     Patient ID: Gregory Blankenship, male   DOB: 10-31-1940, 73 y.o.   MRN: [id number]  HPI this 73 year old male had approximately 35 stab phlebectomy painful varicosities in the right leg performed under local tumescent anesthesia. He tolerated the procedure well.   Review of Systems     Objective:   Physical Exam BP 127/83  Pulse 59  Resp 16  Ht 6' (1.829 m)  Wt 186 lb (84.369 kg)  BMI 25.22 kg/m2       Assessment:     Well tolerated stat lobectomy painful varicosities right leg performed under local tumescent anesthesia-approximately 35    Plan:     Return in 6 weeks for final followup Will resume Lovenox on Wednesday, August 12 and will resume Coumadin tonight Arrange for PT-INR to be checked at the end of week by medical doctor to determine when to stop Lovenox

## 2014-05-18 NOTE — Progress Notes (Signed)
   Laser Ablation Procedure      Date: 05/18/2014    Gregory Blankenship DOB:[birth year]  Consent signed: Yes  Surgeon:J.D. Kellie Simmering  Procedure:right eg  BP 127/83  Pulse 59  Resp 16  Ht 6' (1.829 m)  Wt 186 lb (84.369 kg)  BMI 25.22 kg/m2  Start time: 9:05   End time: 10:35  Tumescent Anesthesia: 225 cc 0.9% NaCl with 50 cc Lidocaine HCL with 1% Epi and 15 cc 8.4% NaHCO3  Local Anesthesia: 9 cc Lidocaine HCL and NaHCO3 (ratio 2:1)      Stab Phlebectomy: >20 Sites: Thigh, Calf and Ankle  Patient tolerated procedure well: Yes  Notes:   Description of Procedure:    The patient was then put into Trendelenburg position.  Local anesthetic was utilized overlying the marked varicosities.  Greater than 20 stab wounds were made using the tip of an 11 blade; and using the vein hook,  The phlebectomies were performed using a hemostat to avulse these varicosities.  Adequate hemostasis was achieved, and steri strips were applied to the stab wound.      ABD pads and thigh high compression stockings were applied.  Ace wrap bandages were applied over the phlebectomy sites.  Blood loss was less than 15 cc.  The patient ambulated out of the operating room having tolerated the procedure well.

## 2014-05-19 ENCOUNTER — Telehealth: Payer: Self-pay | Admitting: *Deleted

## 2014-05-19 NOTE — Telephone Encounter (Signed)
Patient's wife said he had a good night. No pain or bleeding. Following all instructions.

## 2014-05-21 ENCOUNTER — Other Ambulatory Visit: Payer: Self-pay | Admitting: Dermatology

## 2014-06-16 ENCOUNTER — Telehealth: Payer: Self-pay | Admitting: Cardiovascular Disease

## 2014-06-19 NOTE — Telephone Encounter (Signed)
Close encounter 

## 2014-06-26 ENCOUNTER — Encounter: Payer: Self-pay | Admitting: Vascular Surgery

## 2014-06-29 ENCOUNTER — Ambulatory Visit (INDEPENDENT_AMBULATORY_CARE_PROVIDER_SITE_OTHER): Payer: Self-pay | Admitting: Vascular Surgery

## 2014-06-29 ENCOUNTER — Encounter: Payer: Self-pay | Admitting: Vascular Surgery

## 2014-06-29 VITALS — BP 134/86 | HR 64 | Resp 16 | Ht 78.0 in | Wt 186.0 lb

## 2014-06-29 DIAGNOSIS — I83893 Varicose veins of bilateral lower extremities with other complications: Secondary | ICD-10-CM

## 2014-06-29 NOTE — Progress Notes (Signed)
Subjective:     Patient ID: Gregory Blankenship, male   DOB: 01-02-1941, 73 y.o.   MRN: [id number]  HPI this 73 year old male returns 6 weeks post multiple stab phlebectomy of painful varicosities in the right leg. He states that he has had no significant discomfort related to these stab phlebectomy sites. He has noted in both legs that the heaviness and aching discomfort has been relieved and he has no distal edema as he was having prior to his ablation procedures. He has no specific complaints.   Review of Systems     Objective:   Physical Exam BP 134/86  Pulse 64  Resp 16  Ht 6\' 6"  (1.981 m)  Wt 186 lb (84.369 kg)  BMI 21.50 kg/m2  General well-developed well-nourished male in no apparent stress alert and oriented x3 Lungs no rhonchi or lower extremities with nicely healed stab phlebectomy sites. No distal edema noted. The posterior cells pedis pulses palpable bilaterally. A few scattered varicosities are remaining     Assessment:     Successful bilateral laser ablation left anterior accessory branch and right great saphenous line with multiple stab phlebectomy there is complete relief of his symptoms of pain and swelling    Plan:     Return to see [place] on when necessary basis

## 2014-07-22 ENCOUNTER — Ambulatory Visit: Payer: Medicare Other | Admitting: Oncology

## 2014-07-24 ENCOUNTER — Other Ambulatory Visit (HOSPITAL_BASED_OUTPATIENT_CLINIC_OR_DEPARTMENT_OTHER): Payer: Medicare Other

## 2014-07-24 ENCOUNTER — Telehealth: Payer: Self-pay | Admitting: Oncology

## 2014-07-24 ENCOUNTER — Ambulatory Visit (HOSPITAL_BASED_OUTPATIENT_CLINIC_OR_DEPARTMENT_OTHER): Payer: Medicare Other | Admitting: Oncology

## 2014-07-24 VITALS — BP 99/69 | HR 50 | Temp 97.6°F | Resp 18 | Ht 78.0 in | Wt 193.4 lb

## 2014-07-24 DIAGNOSIS — D72829 Elevated white blood cell count, unspecified: Secondary | ICD-10-CM

## 2014-07-24 DIAGNOSIS — D751 Secondary polycythemia: Secondary | ICD-10-CM

## 2014-07-24 LAB — CBC WITH DIFFERENTIAL/PLATELET
BASO%: 0.3 % (ref 0.0–2.0)
BASOS ABS: 0.1 10*3/uL (ref 0.0–0.1)
EOS ABS: 0.4 10*3/uL (ref 0.0–0.5)
EOS%: 2.5 % (ref 0.0–7.0)
HEMATOCRIT: 54 % — AB (ref 38.4–49.9)
HEMOGLOBIN: 16.8 g/dL (ref 13.0–17.1)
LYMPH%: 9.1 % — AB (ref 14.0–49.0)
MCH: 22.1 pg — ABNORMAL LOW (ref 27.2–33.4)
MCHC: 31 g/dL — ABNORMAL LOW (ref 32.0–36.0)
MCV: 71.3 fL — ABNORMAL LOW (ref 79.3–98.0)
MONO#: 1.8 10*3/uL — AB (ref 0.1–0.9)
MONO%: 10.3 % (ref 0.0–14.0)
NEUT%: 77.8 % — AB (ref 39.0–75.0)
NEUTROS ABS: 13.9 10*3/uL — AB (ref 1.5–6.5)
PLATELETS: 572 10*3/uL — AB (ref 140–400)
RBC: 7.57 10*6/uL — ABNORMAL HIGH (ref 4.20–5.82)
RDW: 18.1 % — ABNORMAL HIGH (ref 11.0–14.6)
WBC: 17.9 10*3/uL — AB (ref 4.0–10.3)
lymph#: 1.6 10*3/uL (ref 0.9–3.3)

## 2014-07-24 LAB — COMPREHENSIVE METABOLIC PANEL (CC13)
ALT: 20 U/L (ref 0–55)
ANION GAP: 8 meq/L (ref 3–11)
AST: 26 U/L (ref 5–34)
Albumin: 3.6 g/dL (ref 3.5–5.0)
Alkaline Phosphatase: 82 U/L (ref 40–150)
BILIRUBIN TOTAL: 0.56 mg/dL (ref 0.20–1.20)
BUN: 20.1 mg/dL (ref 7.0–26.0)
CO2: 25 meq/L (ref 22–29)
Calcium: 9.3 mg/dL (ref 8.4–10.4)
Chloride: 106 mEq/L (ref 98–109)
Creatinine: 1.4 mg/dL — ABNORMAL HIGH (ref 0.7–1.3)
GLUCOSE: 80 mg/dL (ref 70–140)
Potassium: 4.2 mEq/L (ref 3.5–5.1)
Sodium: 139 mEq/L (ref 136–145)
TOTAL PROTEIN: 6.4 g/dL (ref 6.4–8.3)

## 2014-07-24 LAB — TECHNOLOGIST REVIEW

## 2014-07-24 NOTE — Progress Notes (Signed)
Hematology and Oncology Follow Up Visit  Gregory Blankenship [id number] 1940-12-25 73 y.o. 07/24/2014 9:10 AM   Principle Diagnosis: 73 year old gentleman with polycythemia diagnosed February of 2013 presented with a hemoglobin of 18. 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 He is also history of coronary arteries disease had a pulmonary embolus.  Current therapy:  Phlebotomy to keep his Hgb close to 17.   Interim History: Gregory Blankenship presents today for a followup visit. Since his last visit, he is doing well without any major changes in his clinical status. He had not had any bleeding complications he had not had any thrombotic events he is continued to be anticoagulated with Coumadin and aspirin. He is very active and exercise regularly and played golf last night. He is also planning a trip to [place] in January of 2016.He is not reporting any headaches, chest pain or difficulty breathing. He was noted to have elevated potassium on recent laboratory data and his lisinopril was discontinued which helped his potassium at this time. He also reported evening time dry cough that also have resolved. He has not reported any fevers chills or sweats. Has not reported any nausea or vomiting. Has not reported any syncope or alteration of mental status. As that report any frequency urgency or hesitancy. Rest of his review of systems unremarkable.  Medications: I have reviewed the patient's current medications. Current Outpatient Prescriptions  Medication Sig Dispense Refill  . acetaminophen (TYLENOL) 325 MG tablet Take 650 mg by mouth every 6 (six) hours as needed for pain.      Marland Kitchen aspirin 81 MG tablet Take 81 mg by mouth daily.      . carvedilol (COREG) 6.25 MG tablet TAKE ONE TABLET BY MOUTH TWICE DAILY  180 tablet  2  . enoxaparin (LOVENOX) 100 MG/ML injection       . isosorbide  mononitrate (IMDUR) 30 MG 24 hr tablet TAKE 1 TABLET BY MOUTH ONCE DAILY  30 tablet  10  . levothyroxine (SYNTHROID, LEVOTHROID) 88 MCG tablet Take 88 mcg by mouth daily.      . nitroGLYCERIN (NITROSTAT) 0.4 MG SL tablet Place 0.4 mg under the tongue every 5 (five) minutes as needed for chest pain.      . simvastatin (ZOCOR) 20 MG tablet Take 20 mg by mouth every evening. Managed by PCP, Dr. Delfina Blankenship      . warfarin (COUMADIN) 5 MG tablet Take 5 mg by mouth daily. Managed by PCP, Dr. Delfina Blankenship       No current facility-administered medications for this visit.    Allergies:  Allergies  Allergen Reactions  . Erythromycin Nausea Only    Past Medical History, Surgical history, Social history, and Family History were reviewed and updated.   Physical Exam:  Blood pressure 99/69, pulse 50, temperature 97.6 F (36.4 C), temperature source Oral, resp. rate 18, height 6\' 6"  (1.981 m), weight 193 lb 6.4 oz (87.726 kg). ECOG: 1 General appearance: Alert and oriented and appears in no active distress. Head: Normocephalic, atraumatic. Pupils equal, round, reactive to light. Oral mucosa moist and pink.  Neck: Supple without adenopathy.  Heart: Regular rate and rhythm. S1, S2.  Lungs: Clear to auscultation. No rhonchi, wheeze, no dullness to percussion.  Abdomen: Soft, nontender. No hepatosplenomegaly. Good bowel sounds. Extremities: No clubbing, cyanosis, or edema.  Neurologic: No deficits noted.   Lab Results: Lab Results  Component Value  Date   WBC 17.9* 07/24/2014   HGB 16.8 07/24/2014   HCT 54.0* 07/24/2014   MCV 71.3* 07/24/2014   PLT 572* 07/24/2014     Chemistry      Component Value Date/Time   NA 138 04/17/2014 1325   NA 138 05/23/2012 0853   K 4.6 04/17/2014 1325   K 5.4* 05/23/2012 0853   CL 102 02/05/2013 1429   CL 101 05/23/2012 0853   CO2 26 04/17/2014 1325   CO2 28 05/23/2012 0853   BUN 21.8 04/17/2014 1325   BUN 22 05/23/2012 0853   CREATININE 1.4* 04/17/2014 1325   CREATININE  1.26 05/23/2012 0853      Component Value Date/Time   CALCIUM 9.3 04/17/2014 1325   CALCIUM 9.4 05/23/2012 0853   ALKPHOS 85 04/17/2014 1325   ALKPHOS 85 08/27/2013 0808   AST 28 04/17/2014 1325   AST 25 08/27/2013 0808   ALT 27 04/17/2014 1325   ALT 17 08/27/2013 0808   BILITOT 0.57 04/17/2014 1325   BILITOT 0.6 08/27/2013 0808      Impression and Plan:  73 year old old with the following issues:  1. 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 His Hgb today is under reasonable control (16.8) and plan to hold off on any phlebotomy at this time. We will repeat his counts in 12/2014.   2. Thrombosis prophylaxis: He is continued to be anticoagulated with Coumadin at low-dose aspirin.  VCBSWH,QPRFF, MD 10/16/20159:10 AM

## 2014-07-24 NOTE — Telephone Encounter (Signed)
gv and printed appts ched and avs fo rpt for March 2016.....no pof

## 2014-08-05 ENCOUNTER — Other Ambulatory Visit: Payer: Self-pay | Admitting: Cardiology

## 2014-08-05 NOTE — Telephone Encounter (Signed)
E sent to pharmacy 

## 2014-08-25 ENCOUNTER — Ambulatory Visit (INDEPENDENT_AMBULATORY_CARE_PROVIDER_SITE_OTHER): Payer: Medicare Other | Admitting: Cardiovascular Disease

## 2014-08-25 ENCOUNTER — Encounter: Payer: Self-pay | Admitting: Cardiovascular Disease

## 2014-08-25 VITALS — BP 100/68 | HR 50 | Ht 72.0 in | Wt 190.6 lb

## 2014-08-25 DIAGNOSIS — I2699 Other pulmonary embolism without acute cor pulmonale: Secondary | ICD-10-CM

## 2014-08-25 DIAGNOSIS — I10 Essential (primary) hypertension: Secondary | ICD-10-CM

## 2014-08-25 DIAGNOSIS — E785 Hyperlipidemia, unspecified: Secondary | ICD-10-CM

## 2014-08-25 DIAGNOSIS — I2101 ST elevation (STEMI) myocardial infarction involving left main coronary artery: Secondary | ICD-10-CM

## 2014-08-25 DIAGNOSIS — I2510 Atherosclerotic heart disease of native coronary artery without angina pectoris: Secondary | ICD-10-CM

## 2014-08-25 NOTE — Assessment & Plan Note (Signed)
History of pulmonary embolism on Coumadin anticoagulation followed by his primary care physician

## 2014-08-25 NOTE — Assessment & Plan Note (Signed)
History of hyperlipidemia on simvastatin 20 mg daily. His last lipid profile was one year ago. We will repeat a fasting lipid profile and continue his current medication at current dosing.

## 2014-08-25 NOTE — Patient Instructions (Signed)
Dr Gwenlyn Found has ordered the following test(s) to be done:  Blood work - lipid, liver panel  Dr Gwenlyn Found wants you to follow-up in 1 year. You will receive a reminder letter in the mail one months in advance. If you don't receive a letter, please call our office to schedule the follow-up appointment.

## 2014-08-25 NOTE — Assessment & Plan Note (Signed)
His blood pressure today in the office is 100/68, well-controlled on carvedilol. We will continue current medications.

## 2014-08-25 NOTE — Progress Notes (Signed)
08/25/2014 Gregory Blankenship   08-17-1941  [id number]  Primary Physician Kandice Hams, MD Primary Cardiologist: Lorretta Harp MD Renae Gloss   HPI:  The patient is a 73 year old, thin and fit-appearing, married Caucasian male, father of 20, grandfather to 5 grandchildren who I last saw in the office 12 months ago. He has a history of an anterior ST-segment-elevation myocardial infarction December 02, 2011. He was on Coumadin anticoagulation because of prior pulmonary embolus. I cath'd him radially revealing a left dominant system with an occluded mid LAD. I tried to percutaneously recanalize him. However, this was unsuccessful probably because this was a "CTO." He did have anterior wall motion abnormalities and EF of 30% and anteroapical dyskinesia which ultimately improved over time to an EF of 50% by 2D echo in June of 2014. He is completely asymptomatic. He participated in cardiac rehab. His lipid profile was excellent 08/27/13 with a total cholesterol of 141, LDL of 82 and HDL of 42. Since I saw him one year ago he denies chest pain or shortness of breath.   Current Outpatient Prescriptions  Medication Sig Dispense Refill  . acetaminophen (TYLENOL) 325 MG tablet Take 650 mg by mouth every 6 (six) hours as needed for pain.    Marland Kitchen aspirin 81 MG tablet Take 81 mg by mouth daily.    . carvedilol (COREG) 6.25 MG tablet TAKE ONE TABLET BY MOUTH TWICE DAILY 180 tablet 2  . enoxaparin (LOVENOX) 100 MG/ML injection     . isosorbide mononitrate (IMDUR) 30 MG 24 hr tablet TAKE 1 TABLET BY MOUTH ONCE DAILY 30 tablet 10  . levothyroxine (SYNTHROID, LEVOTHROID) 88 MCG tablet Take 88 mcg by mouth daily.    Marland Kitchen NITROSTAT 0.4 MG SL tablet PLACE 1 TABELT UNDER THE TONGUE EVERY 5 MINUTES AS NEEDED FOR CHEST PAIN. 25 tablet 3  . simvastatin (ZOCOR) 20 MG tablet Take 20 mg by mouth every evening. Managed by PCP, Dr. Delfina Redwood    . warfarin (COUMADIN) 5 MG tablet Take 5 mg by mouth daily. Managed by  PCP, Dr. Delfina Redwood     No current facility-administered medications for this visit.    Allergies  Allergen Reactions  . Erythromycin Nausea Only    History   Social History  . Marital Status: Married    Spouse Name: N/A    Number of Children: N/A  . Years of Education: N/A   Occupational History  . Not on file.   Social History Main Topics  . Smoking status: Former Smoker    Quit date: 12/01/1968  . Smokeless tobacco: Not on file  . Alcohol Use: 0.6 oz/week    1 Glasses of wine per week  . Drug Use: Not on file  . Sexual Activity: Not on file   Other Topics Concern  . Not on file   Social History Narrative     Review of Systems: General: negative for chills, fever, night sweats or weight changes.  Cardiovascular: negative for chest pain, dyspnea on exertion, edema, orthopnea, palpitations, paroxysmal nocturnal dyspnea or shortness of breath Dermatological: negative for rash Respiratory: negative for cough or wheezing Urologic: negative for hematuria Abdominal: negative for nausea, vomiting, diarrhea, bright red blood per rectum, melena, or hematemesis Neurologic: negative for visual changes, syncope, or dizziness All other systems reviewed and are otherwise negative except as noted above.    Blood pressure 100/68, pulse 50, height 6' (1.829 m), weight 190 lb 9.6 oz (86.456 kg).  General appearance: alert and no  distress Neck: no adenopathy, no carotid bruit, no JVD, supple, symmetrical, trachea midline and thyroid not enlarged, symmetric, no tenderness/mass/nodules Lungs: clear to auscultation bilaterally Heart: regular rate and rhythm, S1, S2 normal, no murmur, click, rub or gallop Extremities: extremities normal, atraumatic, no cyanosis or edema  EKG sinus bradycardia at 58 with septal Q waves unchanged from prior EKGs. I personally reviewed this EKG.  ASSESSMENT AND PLAN:   Pulmonary embolism, 9/11 after prostate surgery History of pulmonary embolism on  Coumadin anticoagulation followed by his primary care physician  STEMI (ST elevation myocardial infarction) History of ST segment elevation myocardial infarction 2/26 3/13. I performed cardiac catheterization radially revealing a left dominant system with an occluded mid LAD. 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 He did have an anterior wall motion and LV with an EF of 30% and anteroapical dyskinesia which ultimately improved to 50% by 2-D echo in June of last year. He is completely asymptomatic. Continue current medications.  Essential hypertension His blood pressure today in the office is 100/68, well-controlled on carvedilol. We will continue current medications.  Hyperlipidemia History of hyperlipidemia on simvastatin 20 mg daily. His last lipid profile was one year ago. We will repeat a fasting lipid profile and continue his current medication at current dosing.      Lorretta Harp MD FACP,FACC,FAHA, [facility] 08/25/2014 8:38 AM

## 2014-08-25 NOTE — Assessment & Plan Note (Signed)
History of ST segment elevation myocardial infarction 2/26 3/13. I performed cardiac catheterization radially revealing a left dominant system with an occluded mid LAD. 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 He did have an anterior wall motion and LV with an EF of 30% and anteroapical dyskinesia which ultimately improved to 50% by 2-D echo in June of last year. He is completely asymptomatic. Continue current medications.

## 2014-08-26 LAB — HEPATIC FUNCTION PANEL
ALK PHOS: 86 U/L (ref 39–117)
ALT: 21 U/L (ref 0–53)
AST: 30 U/L (ref 0–37)
Albumin: 4.5 g/dL (ref 3.5–5.2)
BILIRUBIN TOTAL: 0.7 mg/dL (ref 0.2–1.2)
Bilirubin, Direct: 0.2 mg/dL (ref 0.0–0.3)
Indirect Bilirubin: 0.5 mg/dL (ref 0.2–1.2)
Total Protein: 7.3 g/dL (ref 6.0–8.3)

## 2014-08-26 LAB — LIPID PANEL
Cholesterol: 141 mg/dL (ref 0–200)
HDL: 41 mg/dL (ref >39)
LDL Cholesterol: 83 mg/dL (ref 0–99)
Total CHOL/HDL Ratio: 3.4 Ratio
Triglycerides: 84 mg/dL (ref <150)
VLDL: 17 mg/dL (ref 0–40)

## 2014-09-01 ENCOUNTER — Encounter: Payer: Self-pay | Admitting: *Deleted

## 2014-09-06 ENCOUNTER — Other Ambulatory Visit: Payer: Self-pay | Admitting: Cardiology

## 2014-09-07 NOTE — Telephone Encounter (Signed)
Rx has been sent to the pharmacy electronically. ° °

## 2014-09-10 ENCOUNTER — Other Ambulatory Visit: Payer: Self-pay | Admitting: Cardiovascular Disease

## 2014-09-10 NOTE — Telephone Encounter (Signed)
E-SENT MEDICATION TO PHARMACY  30 X 11 REFILL

## 2014-09-17 ENCOUNTER — Encounter (HOSPITAL_COMMUNITY): Payer: Self-pay | Admitting: Cardiovascular Disease

## 2014-10-28 ENCOUNTER — Other Ambulatory Visit: Payer: Self-pay | Admitting: Cardiovascular Disease

## 2014-12-15 ENCOUNTER — Ambulatory Visit (HOSPITAL_BASED_OUTPATIENT_CLINIC_OR_DEPARTMENT_OTHER): Payer: Medicare Other | Admitting: Oncology

## 2014-12-15 ENCOUNTER — Telehealth: Payer: Self-pay | Admitting: Oncology

## 2014-12-15 ENCOUNTER — Other Ambulatory Visit (HOSPITAL_BASED_OUTPATIENT_CLINIC_OR_DEPARTMENT_OTHER): Payer: Medicare Other

## 2014-12-15 ENCOUNTER — Ambulatory Visit (HOSPITAL_BASED_OUTPATIENT_CLINIC_OR_DEPARTMENT_OTHER): Payer: Medicare Other

## 2014-12-15 ENCOUNTER — Telehealth: Payer: Self-pay | Admitting: *Deleted

## 2014-12-15 VITALS — BP 106/68 | HR 54 | Temp 98.0°F | Resp 18 | Ht 72.0 in | Wt 187.8 lb

## 2014-12-15 DIAGNOSIS — D751 Secondary polycythemia: Secondary | ICD-10-CM

## 2014-12-15 DIAGNOSIS — D72829 Elevated white blood cell count, unspecified: Secondary | ICD-10-CM

## 2014-12-15 LAB — CBC WITH DIFFERENTIAL/PLATELET
BASO%: 2.2 % — ABNORMAL HIGH (ref 0.0–2.0)
Basophils Absolute: 0.4 10e3/uL — ABNORMAL HIGH (ref 0.0–0.1)
EOS%: 2.7 % (ref 0.0–7.0)
Eosinophils Absolute: 0.5 10e3/uL (ref 0.0–0.5)
HCT: 56.1 % — ABNORMAL HIGH (ref 38.4–49.9)
HGB: 18.2 g/dL — ABNORMAL HIGH (ref 13.0–17.1)
LYMPH%: 10.4 % — ABNORMAL LOW (ref 14.0–49.0)
MCH: 23.1 pg — ABNORMAL LOW (ref 27.2–33.4)
MCHC: 32.4 g/dL (ref 32.0–36.0)
MCV: 71.3 fL — ABNORMAL LOW (ref 79.3–98.0)
MONO#: 2.4 10e3/uL — ABNORMAL HIGH (ref 0.1–0.9)
MONO%: 12.4 % (ref 0.0–14.0)
NEUT#: 13.8 10e3/uL — ABNORMAL HIGH (ref 1.5–6.5)
NEUT%: 72.3 % (ref 39.0–75.0)
Platelets: 662 10e3/uL — ABNORMAL HIGH (ref 140–400)
RBC: 7.87 10e6/uL — ABNORMAL HIGH (ref 4.20–5.82)
RDW: 19 % — ABNORMAL HIGH (ref 11.0–14.6)
WBC: 19.1 10e3/uL — ABNORMAL HIGH (ref 4.0–10.3)
lymph#: 2 10e3/uL (ref 0.9–3.3)
nRBC: 0 % (ref 0–0)

## 2014-12-15 LAB — COMPREHENSIVE METABOLIC PANEL (CC13)
ALBUMIN: 3.8 g/dL (ref 3.5–5.0)
ALT: 17 U/L (ref 0–55)
ANION GAP: 9 meq/L (ref 3–11)
AST: 24 U/L (ref 5–34)
Alkaline Phosphatase: 86 U/L (ref 40–150)
BILIRUBIN TOTAL: 0.6 mg/dL (ref 0.20–1.20)
BUN: 17.9 mg/dL (ref 7.0–26.0)
CO2: 26 meq/L (ref 22–29)
Calcium: 9.3 mg/dL (ref 8.4–10.4)
Chloride: 105 mEq/L (ref 98–109)
Creatinine: 1.2 mg/dL (ref 0.7–1.3)
EGFR: 62 mL/min/{1.73_m2} — AB (ref >90)
GLUCOSE: 88 mg/dL (ref 70–140)
POTASSIUM: 4.5 meq/L (ref 3.5–5.1)
Sodium: 140 mEq/L (ref 136–145)
TOTAL PROTEIN: 6.7 g/dL (ref 6.4–8.3)

## 2014-12-15 LAB — TECHNOLOGIST REVIEW

## 2014-12-15 NOTE — Progress Notes (Signed)
Hematology and Oncology Follow Up Visit  Gregory Blankenship [id number] [birth year] 74 y.o. 12/15/2014 8:54 AM   Principle Diagnosis: 74 year old gentleman with polycythemia diagnosed February of 2013 presented with a hemoglobin of 18. 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 He is also history of coronary arteries disease had a pulmonary embolus.  Current therapy:  Phlebotomy to keep his Hgb close to 17.   Interim History: Mr. Gregory Blankenship presents today for a followup visit. Since his last visit, he reports no major complaints. He recently traveled to [place] and return without any complications. He did not have any thrombosis or bleeding. He continues to be anticoagulated with Coumadin and aspirin. He is not reporting any headaches, chest pain or difficulty breathing. He was noted to have elevated potassium on recent laboratory data and his lisinopril was discontinued which helped his potassium at this time. He also reported evening time dry cough that also have resolved. He has not reported any fevers chills or sweats. Has not reported any nausea or vomiting. Has not reported any syncope or alteration of mental status. As that report any frequency urgency or hesitancy. Rest of his review of systems unremarkable.  Medications: I have reviewed the patient's current medications. Current Outpatient Prescriptions  Medication Sig Dispense Refill  . acetaminophen (TYLENOL) 325 MG tablet Take 650 mg by mouth every 6 (six) hours as needed for pain.    Marland Kitchen aspirin 81 MG tablet Take 81 mg by mouth daily.    . carvedilol (COREG) 6.25 MG tablet TAKE 1 TABLET BY MOUTH TWICE DAILY 180 tablet 2  . enoxaparin (LOVENOX) 100 MG/ML injection     . isosorbide mononitrate (IMDUR) 30 MG 24 hr tablet TAKE 1 TABLET BY MOUTH ONCE DAILY 30 tablet 11  . isosorbide mononitrate (IMDUR) 30 MG 24 hr tablet TAKE 1  TABLET BY MOUTH ONCE DAILY 90 tablet 3  . levothyroxine (SYNTHROID, LEVOTHROID) 88 MCG tablet Take 88 mcg by mouth daily.    Marland Kitchen NITROSTAT 0.4 MG SL tablet PLACE 1 TABELT UNDER THE TONGUE EVERY 5 MINUTES AS NEEDED FOR CHEST PAIN. 25 tablet 3  . simvastatin (ZOCOR) 20 MG tablet Take 20 mg by mouth every evening. Managed by PCP, Dr. Delfina Redwood    . warfarin (COUMADIN) 5 MG tablet Take 5 mg by mouth daily. Managed by PCP, Dr. Delfina Redwood     No current facility-administered medications for this visit.    Allergies:  Allergies  Allergen Reactions  . Erythromycin Nausea Only    Past Medical History, Surgical history, Social history, and Family History were reviewed and updated.   Physical Exam:  Blood pressure 106/68, pulse 54, temperature 98 F (36.7 C), temperature source Oral, resp. rate 18, height 6' (1.829 m), weight 187 lb 12.8 oz (85.186 kg), SpO2 99 %. ECOG: 1 General appearance: Alert and oriented and appears in no active distress. Head: Normocephalic, atraumatic. Pupils equal, round, reactive to light. Oral mucosa moist and pink.  Neck: Supple without adenopathy.  Heart: Regular rate and rhythm. S1, S2.  Lungs: Clear to auscultation. No rhonchi, wheeze, no dullness to percussion.  Abdomen: Soft, nontender. No hepatosplenomegaly. Good bowel sounds. Extremities: No clubbing, cyanosis, or edema.  Neurologic: No deficits noted.   Lab Results: Lab Results  Component Value Date   WBC 19.1* 12/15/2014   HGB 18.2* 12/15/2014   HCT 56.1* 12/15/2014   MCV 71.3* 12/15/2014   PLT  662* 12/15/2014     Chemistry      Component Value Date/Time   NA 139 07/24/2014 0840   NA 138 05/23/2012 0853   K 4.2 07/24/2014 0840   K 5.4* 05/23/2012 0853   CL 102 02/05/2013 1429   CL 101 05/23/2012 0853   CO2 25 07/24/2014 0840   CO2 28 05/23/2012 0853   BUN 20.1 07/24/2014 0840   BUN 22 05/23/2012 0853   CREATININE 1.4* 07/24/2014 0840   CREATININE 1.26 05/23/2012 0853      Component Value  Date/Time   CALCIUM 9.3 07/24/2014 0840   CALCIUM 9.4 05/23/2012 0853   ALKPHOS 86 08/26/2014 0802   ALKPHOS 82 07/24/2014 0840   AST 30 08/26/2014 0802   AST 26 07/24/2014 0840   ALT 21 08/26/2014 0802   ALT 20 07/24/2014 0840   BILITOT 0.7 08/26/2014 0802   BILITOT 0.56 07/24/2014 0840      Impression and Plan:  74 year old old with the following issues:  1. 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 His Hgb today is slightly elevated at 18.2. Although he is asymptomatic it would be reasonable to proceed with a phlebotomy and repeat his blood count in 4 months. The goal is to get him a hemoglobin less than 17.  2. Thrombosis prophylaxis: He is continued to be anticoagulated with Coumadin at low-dose aspirin.  [facility], MD 3/8/20168:54 AM

## 2014-12-15 NOTE — Telephone Encounter (Signed)
Pt confirmed labs/ov/phlebotomy per 03/08 POF, gave pt AVS..... KJ

## 2014-12-15 NOTE — Patient Instructions (Signed)

## 2014-12-15 NOTE — Telephone Encounter (Signed)
Per staff message and POF I have scheduled appts. Advised scheduler of appts. JMW  

## 2015-04-15 ENCOUNTER — Ambulatory Visit (HOSPITAL_BASED_OUTPATIENT_CLINIC_OR_DEPARTMENT_OTHER): Payer: Medicare Other

## 2015-04-15 ENCOUNTER — Ambulatory Visit (HOSPITAL_BASED_OUTPATIENT_CLINIC_OR_DEPARTMENT_OTHER): Payer: Medicare Other | Admitting: Oncology

## 2015-04-15 ENCOUNTER — Telehealth: Payer: Self-pay | Admitting: Oncology

## 2015-04-15 ENCOUNTER — Other Ambulatory Visit (HOSPITAL_BASED_OUTPATIENT_CLINIC_OR_DEPARTMENT_OTHER): Payer: Medicare Other

## 2015-04-15 VITALS — BP 122/72 | HR 55 | Temp 97.4°F | Resp 16 | Ht 72.0 in | Wt 190.4 lb

## 2015-04-15 VITALS — BP 112/81 | HR 51 | Temp 97.6°F | Resp 16

## 2015-04-15 DIAGNOSIS — Z7901 Long term (current) use of anticoagulants: Secondary | ICD-10-CM

## 2015-04-15 DIAGNOSIS — Z86711 Personal history of pulmonary embolism: Secondary | ICD-10-CM

## 2015-04-15 DIAGNOSIS — Z8546 Personal history of malignant neoplasm of prostate: Secondary | ICD-10-CM

## 2015-04-15 DIAGNOSIS — D751 Secondary polycythemia: Secondary | ICD-10-CM

## 2015-04-15 DIAGNOSIS — D72829 Elevated white blood cell count, unspecified: Secondary | ICD-10-CM

## 2015-04-15 LAB — COMPREHENSIVE METABOLIC PANEL (CC13)
ALK PHOS: 91 U/L (ref 40–150)
ALT: 22 U/L (ref 0–55)
AST: 25 U/L (ref 5–34)
Albumin: 3.9 g/dL (ref 3.5–5.0)
Anion Gap: 9 mEq/L (ref 3–11)
BUN: 17.9 mg/dL (ref 7.0–26.0)
CO2: 26 mEq/L (ref 22–29)
Calcium: 9.6 mg/dL (ref 8.4–10.4)
Chloride: 105 mEq/L (ref 98–109)
Creatinine: 1.3 mg/dL (ref 0.7–1.3)
EGFR: 55 mL/min/{1.73_m2} — ABNORMAL LOW (ref >90)
Glucose: 76 mg/dl (ref 70–140)
POTASSIUM: 4.1 meq/L (ref 3.5–5.1)
Sodium: 139 mEq/L (ref 136–145)
Total Bilirubin: 0.61 mg/dL (ref 0.20–1.20)
Total Protein: 6.9 g/dL (ref 6.4–8.3)

## 2015-04-15 LAB — CBC WITH DIFFERENTIAL/PLATELET
BASO%: 1.6 % (ref 0.0–2.0)
BASOS ABS: 0.3 10*3/uL — AB (ref 0.0–0.1)
EOS%: 2.2 % (ref 0.0–7.0)
Eosinophils Absolute: 0.4 10*3/uL (ref 0.0–0.5)
HCT: 54.1 % — ABNORMAL HIGH (ref 38.4–49.9)
HEMOGLOBIN: 17.7 g/dL — AB (ref 13.0–17.1)
LYMPH#: 1.9 10*3/uL (ref 0.9–3.3)
LYMPH%: 9.7 % — ABNORMAL LOW (ref 14.0–49.0)
MCH: 22.5 pg — ABNORMAL LOW (ref 27.2–33.4)
MCHC: 32.7 g/dL (ref 32.0–36.0)
MCV: 68.7 fL — AB (ref 79.3–98.0)
MONO#: 2.3 10*3/uL — AB (ref 0.1–0.9)
MONO%: 11.7 % (ref 0.0–14.0)
NEUT%: 74.8 % (ref 39.0–75.0)
NEUTROS ABS: 15 10*3/uL — AB (ref 1.5–6.5)
Platelets: 599 10*3/uL — ABNORMAL HIGH (ref 140–400)
RBC: 7.87 10*6/uL — AB (ref 4.20–5.82)
RDW: 17.6 % — ABNORMAL HIGH (ref 11.0–14.6)
WBC: 20 10*3/uL — AB (ref 4.0–10.3)
nRBC: 0 % (ref 0–0)

## 2015-04-15 LAB — TECHNOLOGIST REVIEW

## 2015-04-15 NOTE — Progress Notes (Signed)
Hgb 17.7 - per Dr. Alen Blew, perform phlebotomy.   Pt reports having breakfast - apple juice provided prior to start.  Phlebotomy started at 947 am.  Completed at 955 am.  Pt tolerated well.   Provided graham crackers, peanut butter and more apple juice.  Pt observed for 30 minutes.  Post phlebotomy vital signs stable.  Pt discharged ambulatory and without complaints.

## 2015-04-15 NOTE — Telephone Encounter (Signed)
Pt confirmed labs/ov per 07/07 POF, gave pt AVS and Calendar... KJ, also added Phlebotomy and had another scheduler check behind me.Marland KitchenMarland KitchenMarland Kitchen

## 2015-04-15 NOTE — Progress Notes (Signed)
Hematology and Oncology Follow Up Visit  Gregory Blankenship [id number] 05/25/1941 74 y.o. 04/15/2015 8:44 AM   Principle Diagnosis: 74 year old gentleman with polycythemia diagnosed February of 2013 presented with a hemoglobin of 18. 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 He is also history of coronary arteries disease had a pulmonary embolus.  Current therapy:  Phlebotomy to keep his Hgb less than 17.   Interim History: Gregory Blankenship presents today for a followup visit. Since his last visit, he continues to do well without any complaints. He is planning to travel again to [place] in August 2016.  He did not have any thrombosis or bleeding. He continues to be anticoagulated with Coumadin and aspirin. He has not reported any chest pain or angina. Has not reported any epistaxis.  He is not reporting any headaches, chest pain or difficulty breathing. He has not reported any fevers chills or sweats. Has not reported any nausea or vomiting. Has not reported any syncope or alteration of mental status. As that report any frequency urgency or hesitancy. He does not report any skeletal complaints of arthralgias or myalgias. Rest of his review of systems unremarkable.  Medications: I have reviewed the patient's current medications. Current Outpatient Prescriptions  Medication Sig Dispense Refill  . acetaminophen (TYLENOL) 325 MG tablet Take 650 mg by mouth every 6 (six) hours as needed for pain.    Marland Kitchen aspirin 81 MG tablet Take 81 mg by mouth daily.    . carvedilol (COREG) 6.25 MG tablet TAKE 1 TABLET BY MOUTH TWICE DAILY 180 tablet 2  . isosorbide mononitrate (IMDUR) 30 MG 24 hr tablet TAKE 1 TABLET BY MOUTH ONCE DAILY 30 tablet 11  . levothyroxine (SYNTHROID, LEVOTHROID) 88 MCG tablet Take 88 mcg by mouth daily.    Marland Kitchen NITROSTAT 0.4 MG SL tablet PLACE 1 TABELT UNDER THE TONGUE EVERY 5  MINUTES AS NEEDED FOR CHEST PAIN. 25 tablet 3  . simvastatin (ZOCOR) 20 MG tablet Take 20 mg by mouth every evening. Managed by PCP, Dr. Delfina Blankenship    . warfarin (COUMADIN) 5 MG tablet Take 5 mg by mouth daily. Managed by PCP, Dr. Delfina Blankenship     No current facility-administered medications for this visit.    Allergies:  Allergies  Allergen Reactions  . Erythromycin Nausea Only    Past Medical History, Surgical history, Social history, and Family History were reviewed and updated.   Physical Exam:  Blood pressure 122/72, pulse 55, temperature 97.4 F (36.3 C), temperature source Oral, resp. rate 16, height 6' (1.829 m), weight 190 lb 6.4 oz (86.365 kg), SpO2 99 %. ECOG: 1 General appearance: Alert and oriented well-appearing gentleman. Head: Normocephalic, atraumatic. Oral mucosa moist and pink.  Neck: Supple without adenopathy.  Heart: Regular rate and rhythm. S1, S2. No murmurs or gallops. Lungs: Clear to auscultation. No rhonchi, wheeze, no dullness to percussion.  Abdomen: Soft, nontender. No hepatosplenomegaly. Good bowel sounds. Extremities: No clubbing, cyanosis, or edema.  Neurologic: No deficits noted.   Lab Results: Lab Results  Component Value Date   WBC 20.0* 04/15/2015   HGB 17.7* 04/15/2015   HCT 54.1* 04/15/2015   MCV 68.7* 04/15/2015   PLT 599* 04/15/2015     Chemistry      Component Value Date/Time   NA 140 12/15/2014 0819   NA 138 05/23/2012 0853   K 4.5 12/15/2014 0819   K 5.4* 05/23/2012 7371  CL 102 02/05/2013 1429   CL 101 05/23/2012 0853   CO2 26 12/15/2014 0819   CO2 28 05/23/2012 0853   BUN 17.9 12/15/2014 0819   BUN 22 05/23/2012 0853   CREATININE 1.2 12/15/2014 0819   CREATININE 1.26 05/23/2012 0853      Component Value Date/Time   CALCIUM 9.3 12/15/2014 0819   CALCIUM 9.4 05/23/2012 0853   ALKPHOS 86 12/15/2014 0819   ALKPHOS 86 08/26/2014 0802   AST 24 12/15/2014 0819   AST 30 08/26/2014 0802   ALT 17 12/15/2014 0819   ALT 21  08/26/2014 0802   BILITOT 0.60 12/15/2014 0819   BILITOT 0.7 08/26/2014 0802      Impression and Plan:  74 year old old with the following issues:  1. 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 His Hgb today is slightly elevated at 17.7 Although he is asymptomatic it would be reasonable to proceed with a phlebotomy and repeat his blood count in 4 months. The goal is to get him a hemoglobin less than 17. He never had any complications associated with phlebotomy and would be reasonable to perform lab for his travel to decrease his thrombosis risk.  2. Thrombosis prophylaxis: He is continued to be anticoagulated with Coumadin at low-dose aspirin.  [facility], MD 7/7/20168:44 AM

## 2015-04-15 NOTE — Patient Instructions (Signed)

## 2015-04-29 ENCOUNTER — Encounter: Payer: Self-pay | Admitting: Cardiovascular Disease

## 2015-06-11 ENCOUNTER — Other Ambulatory Visit: Payer: Self-pay | Admitting: Cardiology

## 2015-08-10 ENCOUNTER — Encounter: Payer: Self-pay | Admitting: Cardiovascular Disease

## 2015-08-10 ENCOUNTER — Ambulatory Visit (INDEPENDENT_AMBULATORY_CARE_PROVIDER_SITE_OTHER): Payer: Medicare Other | Admitting: Cardiovascular Disease

## 2015-08-10 VITALS — BP 100/72 | HR 56

## 2015-08-10 DIAGNOSIS — E785 Hyperlipidemia, unspecified: Secondary | ICD-10-CM

## 2015-08-10 DIAGNOSIS — I10 Essential (primary) hypertension: Secondary | ICD-10-CM

## 2015-08-10 DIAGNOSIS — I2510 Atherosclerotic heart disease of native coronary artery without angina pectoris: Secondary | ICD-10-CM | POA: Diagnosis not present

## 2015-08-10 DIAGNOSIS — I2109 ST elevation (STEMI) myocardial infarction involving other coronary artery of anterior wall: Secondary | ICD-10-CM | POA: Diagnosis not present

## 2015-08-10 DIAGNOSIS — Z7901 Long term (current) use of anticoagulants: Secondary | ICD-10-CM

## 2015-08-10 NOTE — Progress Notes (Signed)
08/10/2015 Gregory Blankenship   May 05, 1941  [id number]  Primary Physician Kandice Hams, MD Primary Cardiologist: Lorretta Harp MD Renae Gloss   HPI:  The patient is a 74 year old, thin and fit-appearing, married Caucasian male, father of 59, grandfather to 5 grandchildren who I last saw in the office 12 months ago. He has a history of an anterior ST-segment-elevation myocardial infarction December 02, 2011. He was on Coumadin anticoagulation because of prior pulmonary embolus. I cath'd him radially revealing a left dominant system with an occluded mid LAD. I tried to percutaneously recanalize him. However, this was unsuccessful probably because this was a "CTO." He did have anterior wall motion abnormalities and EF of 30% and anteroapical dyskinesia which ultimately improved over time to an EF of 50% by 2D echo in June of 2014. He is completely asymptomatic. He participated in cardiac rehab. His lipid profile was excellent 08/26/14 with a total cholesterol of 141, LDL 83 and HDL of 41.. Since I saw him one year ago he denies chest pain or shortness of breath. He has a daughter who lives in [place] adheres wife travel there several times a year to visit.   Current Outpatient Prescriptions  Medication Sig Dispense Refill  . acetaminophen (TYLENOL) 325 MG tablet Take 650 mg by mouth every 6 (six) hours as needed for pain.    Marland Kitchen aspirin 81 MG tablet Take 81 mg by mouth daily.    . carvedilol (COREG) 6.25 MG tablet TAKE 1 TABLET BY MOUTH TWICE DAILY 180 tablet 0  . isosorbide mononitrate (IMDUR) 30 MG 24 hr tablet TAKE 1 TABLET BY MOUTH ONCE DAILY 30 tablet 11  . levothyroxine (SYNTHROID, LEVOTHROID) 88 MCG tablet Take 88 mcg by mouth daily.    Marland Kitchen NITROSTAT 0.4 MG SL tablet PLACE 1 TABELT UNDER THE TONGUE EVERY 5 MINUTES AS NEEDED FOR CHEST PAIN. 25 tablet 3  . simvastatin (ZOCOR) 20 MG tablet Take 20 mg by mouth every evening. Managed by PCP, Dr. Delfina Redwood    . warfarin (COUMADIN) 5  MG tablet Take 5 mg by mouth daily. Managed by PCP, Dr. Delfina Redwood     No current facility-administered medications for this visit.    Allergies  Allergen Reactions  . Erythromycin Nausea Only    Social History   Social History  . Marital Status: Married    Spouse Name: N/A  . Number of Children: N/A  . Years of Education: N/A   Occupational History  . Not on file.   Social History Main Topics  . Smoking status: Former Smoker    Quit date: 12/01/1968  . Smokeless tobacco: Not on file  . Alcohol Use: 0.6 oz/week    1 Glasses of wine per week  . Drug Use: Not on file  . Sexual Activity: Not on file   Other Topics Concern  . Not on file   Social History Narrative     Review of Systems: General: negative for chills, fever, night sweats or weight changes.  Cardiovascular: negative for chest pain, dyspnea on exertion, edema, orthopnea, palpitations, paroxysmal nocturnal dyspnea or shortness of breath Dermatological: negative for rash Respiratory: negative for cough or wheezing Urologic: negative for hematuria Abdominal: negative for nausea, vomiting, diarrhea, bright red blood per rectum, melena, or hematemesis Neurologic: negative for visual changes, syncope, or dizziness All other systems reviewed and are otherwise negative except as noted above.    Blood pressure 100/72, pulse 56.  General appearance: alert and no distress Neck: no  adenopathy, no carotid bruit, no JVD, supple, symmetrical, trachea midline and thyroid not enlarged, symmetric, no tenderness/mass/nodules Lungs: normal percussion bilaterally Heart: regular rate and rhythm, S1, S2 normal, no murmur, click, rub or gallop Extremities: extremities normal, atraumatic, no cyanosis or edema  EKG normal sinus rhythm at 60 with Q waves from V1 to 3 V4 consistent with an old anterior wall myocardial infarction. 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 I performed crit catheterization radially revealing an occluded mid LAD which I was unable to percutaneously recanalize. I thought this was a "CTO". His initial EF was 30% with an anteroapical wall motion mallet was ultimately improved over time 20 EF of 50% by 2-D echo June 2014. He denies chest pain or shortness of breath.  Hyperlipidemia History of hyperlipidemia on simvastatin 20 mg a day with his last lipid profile performed 08/26/14 revealed total cholesterol 141, LDL of 83 and HDL of 41. We will repeat a lipid and liver profile  Essential hypertension History of hypertension with blood pressure measured at 100/72. He is on carvedilol. He is not on ACE inhibitor most likely because of his low baseline blood pressure. Continue current meds at current dosing  Chronic anticoagulation History of remote pulmonary embolus him on Coumadin anticoagulation      Lorretta Harp MD [facility], [facility] 08/10/2015 8:24 AM

## 2015-08-10 NOTE — Assessment & Plan Note (Signed)
History of hypertension with blood pressure measured at 100/72. He is on carvedilol. He is not on ACE inhibitor most likely because of his low baseline blood pressure. Continue current meds at current dosing

## 2015-08-10 NOTE — Assessment & Plan Note (Signed)
History of hyperlipidemia on simvastatin 20 mg a day with his last lipid profile performed 08/26/14 revealed total cholesterol 141, LDL of 83 and HDL of 41. We will repeat a lipid and liver profile

## 2015-08-10 NOTE — Assessment & Plan Note (Signed)
History of remote pulmonary embolus him on Coumadin anticoagulation

## 2015-08-10 NOTE — Assessment & Plan Note (Signed)
History of CAD status post anterior STEMI 12/02/11. I performed crit catheterization radially revealing an occluded mid LAD which I was unable to percutaneously recanalize. I thought this was a "CTO". His initial EF was 30% with an anteroapical wall motion mallet was ultimately improved over time 20 EF of 50% by 2-D echo June 2014. He denies chest pain or shortness of breath.

## 2015-08-10 NOTE — Patient Instructions (Signed)
Medication Instructions:  Your physician recommends that you continue on your current medications as directed. Please refer to the Current Medication list given to you today.   Labwork: Your physician recommends that you return for lab work in: Lipids/Liver The lab can be found on the FIRST FLOOR of out building in [address]   Testing/Procedures: none  Follow-Up: Your physician wants you to follow-up in: 12 months with Dr. Gwenlyn Found. You will receive a reminder letter in the mail two months in advance. If you don't receive a letter, please call our office to schedule the follow-up appointment.   Any Other Special Instructions Will Be Listed Below (If Applicable).     If you need a refill on your cardiac medications before your next appointment, please call your pharmacy.

## 2015-08-11 LAB — HEPATIC FUNCTION PANEL
ALBUMIN: 4 g/dL (ref 3.6–5.1)
ALT: 15 U/L (ref 9–46)
AST: 23 U/L (ref 10–35)
Alkaline Phosphatase: 86 U/L (ref 40–115)
Bilirubin, Direct: 0.2 mg/dL (ref <=0.2)
Indirect Bilirubin: 0.4 mg/dL (ref 0.2–1.2)
Total Bilirubin: 0.6 mg/dL (ref 0.2–1.2)
Total Protein: 6.6 g/dL (ref 6.1–8.1)

## 2015-08-11 LAB — LIPID PANEL
CHOLESTEROL: 133 mg/dL (ref 125–200)
HDL: 31 mg/dL — ABNORMAL LOW (ref >=40)
LDL Cholesterol: 85 mg/dL (ref <130)
Total CHOL/HDL Ratio: 4.3 Ratio (ref <=5.0)
Triglycerides: 84 mg/dL (ref <150)
VLDL: 17 mg/dL (ref <30)

## 2015-08-19 ENCOUNTER — Ambulatory Visit (HOSPITAL_BASED_OUTPATIENT_CLINIC_OR_DEPARTMENT_OTHER): Payer: Medicare Other | Admitting: Oncology

## 2015-08-19 ENCOUNTER — Other Ambulatory Visit (HOSPITAL_BASED_OUTPATIENT_CLINIC_OR_DEPARTMENT_OTHER): Payer: Medicare Other

## 2015-08-19 ENCOUNTER — Telehealth: Payer: Self-pay | Admitting: Oncology

## 2015-08-19 VITALS — BP 106/68 | HR 46 | Temp 98.5°F | Resp 17 | Ht 72.0 in | Wt 194.7 lb

## 2015-08-19 DIAGNOSIS — D751 Secondary polycythemia: Secondary | ICD-10-CM

## 2015-08-19 LAB — COMPREHENSIVE METABOLIC PANEL (CC13)
ALBUMIN: 3.8 g/dL (ref 3.5–5.0)
ALK PHOS: 81 U/L (ref 40–150)
ALT: 16 U/L (ref 0–55)
ANION GAP: 8 meq/L (ref 3–11)
AST: 24 U/L (ref 5–34)
BILIRUBIN TOTAL: 0.56 mg/dL (ref 0.20–1.20)
BUN: 20.6 mg/dL (ref 7.0–26.0)
CO2: 26 mEq/L (ref 22–29)
Calcium: 9.4 mg/dL (ref 8.4–10.4)
Chloride: 105 mEq/L (ref 98–109)
Creatinine: 1.3 mg/dL (ref 0.7–1.3)
EGFR: 55 mL/min/{1.73_m2} — AB (ref >90)
GLUCOSE: 79 mg/dL (ref 70–140)
POTASSIUM: 4.7 meq/L (ref 3.5–5.1)
SODIUM: 139 meq/L (ref 136–145)
TOTAL PROTEIN: 6.7 g/dL (ref 6.4–8.3)

## 2015-08-19 LAB — CBC WITH DIFFERENTIAL/PLATELET
BASO%: 1.7 % (ref 0.0–2.0)
BASOS ABS: 0.4 10*3/uL — AB (ref 0.0–0.1)
EOS%: 2.4 % (ref 0.0–7.0)
Eosinophils Absolute: 0.6 10*3/uL — ABNORMAL HIGH (ref 0.0–0.5)
HCT: 52.1 % — ABNORMAL HIGH (ref 38.4–49.9)
HGB: 16.7 g/dL (ref 13.0–17.1)
LYMPH#: 2.2 10*3/uL (ref 0.9–3.3)
LYMPH%: 9.5 % — ABNORMAL LOW (ref 14.0–49.0)
MCH: 22.1 pg — ABNORMAL LOW (ref 27.2–33.4)
MCHC: 32.1 g/dL (ref 32.0–36.0)
MCV: 68.8 fL — AB (ref 79.3–98.0)
MONO#: 3.2 10*3/uL — ABNORMAL HIGH (ref 0.1–0.9)
MONO%: 13.8 % (ref 0.0–14.0)
NEUT#: 16.9 10*3/uL — ABNORMAL HIGH (ref 1.5–6.5)
NEUT%: 72.6 % (ref 39.0–75.0)
NRBC: 0 % (ref 0–0)
Platelets: 730 10*3/uL — ABNORMAL HIGH (ref 140–400)
RBC: 7.57 10*6/uL — ABNORMAL HIGH (ref 4.20–5.82)
RDW: 18.6 % — ABNORMAL HIGH (ref 11.0–14.6)
WBC: 23.2 10*3/uL — ABNORMAL HIGH (ref 4.0–10.3)

## 2015-08-19 NOTE — Progress Notes (Signed)
Hematology and Oncology Follow Up Visit  Gregory Blankenship [id number] 1941/08/07 74 y.o. 08/19/2015 8:42 AM   Principle Diagnosis: 74 year old gentleman with polycythemia diagnosed February of 2013 presented with a hemoglobin of 18. 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 He is also history of coronary arteries disease had a pulmonary embolus.  Current therapy:  Phlebotomy to keep his Hgb less than 17.   Interim History: Gregory Blankenship presents today for a followup visit. Since his last visit, he traveled to [place] without any complications. He did not have any issues with a flight or afterwards. He has not reported any thrombosis or bleeding episodes. He continues to be on warfarin and aspirin. He has not reported any headaches or blurry vision. He has not reported any chest pain or angina. Does not report any dyspnea on exertion. He continues to perform activities of daily living without any decline. His last phlebotomy was done in July 2016 and have tolerated it well.   He has not reported any fevers chills or sweats. Has not reported any nausea or vomiting. Has not reported any syncope or alteration of mental status. As that report any frequency urgency or hesitancy. He does not report any skeletal complaints of arthralgias or myalgias. Rest of his review of systems unremarkable.  Medications: I have reviewed the patient's current medications. Current Outpatient Prescriptions  Medication Sig Dispense Refill  . acetaminophen (TYLENOL) 325 MG tablet Take 650 mg by mouth every 6 (six) hours as needed for pain.    Marland Kitchen aspirin 81 MG tablet Take 81 mg by mouth daily.    . carvedilol (COREG) 6.25 MG tablet TAKE 1 TABLET BY MOUTH TWICE DAILY 180 tablet 0  . isosorbide mononitrate (IMDUR) 30 MG 24 hr tablet TAKE 1 TABLET BY MOUTH ONCE DAILY 30 tablet 11  . levothyroxine (SYNTHROID,  LEVOTHROID) 88 MCG tablet Take 88 mcg by mouth daily.    Marland Kitchen NITROSTAT 0.4 MG SL tablet PLACE 1 TABELT UNDER THE TONGUE EVERY 5 MINUTES AS NEEDED FOR CHEST PAIN. 25 tablet 3  . simvastatin (ZOCOR) 20 MG tablet Take 20 mg by mouth every evening. Managed by PCP, Dr. Delfina Redwood    . warfarin (COUMADIN) 5 MG tablet Take 5 mg by mouth daily. Managed by PCP, Dr. Delfina Redwood     No current facility-administered medications for this visit.    Allergies:  Allergies  Allergen Reactions  . Erythromycin Nausea Only    Past Medical History, Surgical history, Social history, and Family History were reviewed and updated.   Physical Exam:  Blood pressure 106/68, pulse 46, temperature 98.5 F (36.9 C), temperature source Oral, resp. rate 17, height 6' (1.829 m), weight 194 lb 11.2 oz (88.315 kg), SpO2 98 %. ECOG: 1 General appearance: Alert gentleman without distress. Head: Normocephalic, atraumatic. No oral ulcers or thrush. Neck: Supple without adenopathy.  Heart: Regular rate and rhythm. S1, S2. No murmurs or gallops. Lungs: Clear to auscultation. No rhonchi, wheeze, no dullness to percussion.  Abdomen: Soft, nontender. No hepatosplenomegaly. Good bowel sounds. No rebound or guarding. Extremities: No clubbing, cyanosis, or edema.  Neurologic: No deficits noted.   Lab Results: Lab Results  Component Value Date   WBC 23.2* 08/19/2015   HGB 16.7 08/19/2015   HCT 52.1* 08/19/2015   MCV 68.8* 08/19/2015   PLT 730* 08/19/2015     Chemistry      Component Value Date/Time  NA 139 04/15/2015 0808   NA 138 05/23/2012 0853   K 4.1 04/15/2015 0808   K 5.4* 05/23/2012 0853   CL 102 02/05/2013 1429   CL 101 05/23/2012 0853   CO2 26 04/15/2015 0808   CO2 28 05/23/2012 0853   BUN 17.9 04/15/2015 0808   BUN 22 05/23/2012 0853   CREATININE 1.3 04/15/2015 0808   CREATININE 1.26 05/23/2012 0853      Component Value Date/Time   CALCIUM 9.6 04/15/2015 0808   CALCIUM 9.4 05/23/2012 0853   ALKPHOS 86  08/10/2015 0813   ALKPHOS 91 04/15/2015 0808   AST 23 08/10/2015 0813   AST 25 04/15/2015 0808   ALT 15 08/10/2015 0813   ALT 22 04/15/2015 0808   BILITOT 0.6 08/10/2015 0813   BILITOT 0.61 04/15/2015 0808      Impression and Plan:  74 year old old with the following issues:  1. 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 His Hgb today is within normal range and does not require phlebotomy. The plan is to repeat his laboratory testing in 4 months in anticipation of his next trip to [place]. If his hemoglobin is above 17, we will perform a phlebotomy at that time.   2. Thrombosis prophylaxis: He is continued to be anticoagulated with Coumadin at low-dose aspirin.  3. 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  Zola Button, MD 11/10/20168:42 AM

## 2015-08-19 NOTE — Addendum Note (Signed)
Addended by: Randolm Idol on: 08/19/2015 08:50 AM   Modules accepted: Medications

## 2015-08-19 NOTE — Telephone Encounter (Signed)
Gave patient avs report and appointments for March 2017.  °

## 2015-09-13 ENCOUNTER — Other Ambulatory Visit: Payer: Self-pay | Admitting: Cardiovascular Disease

## 2015-09-13 NOTE — Telephone Encounter (Signed)
REFILL 

## 2015-11-18 DIAGNOSIS — Z7901 Long term (current) use of anticoagulants: Secondary | ICD-10-CM | POA: Diagnosis not present

## 2015-12-08 DIAGNOSIS — C61 Malignant neoplasm of prostate: Secondary | ICD-10-CM | POA: Diagnosis not present

## 2015-12-10 ENCOUNTER — Ambulatory Visit (HOSPITAL_BASED_OUTPATIENT_CLINIC_OR_DEPARTMENT_OTHER): Payer: Medicare Other

## 2015-12-10 ENCOUNTER — Other Ambulatory Visit (HOSPITAL_BASED_OUTPATIENT_CLINIC_OR_DEPARTMENT_OTHER): Payer: Medicare Other

## 2015-12-10 ENCOUNTER — Telehealth: Payer: Self-pay | Admitting: Oncology

## 2015-12-10 ENCOUNTER — Ambulatory Visit (HOSPITAL_BASED_OUTPATIENT_CLINIC_OR_DEPARTMENT_OTHER): Payer: Medicare Other | Admitting: Oncology

## 2015-12-10 VITALS — BP 106/73 | HR 50 | Temp 97.5°F | Resp 16 | Ht 72.0 in | Wt 193.0 lb

## 2015-12-10 VITALS — BP 111/82 | HR 52

## 2015-12-10 DIAGNOSIS — D751 Secondary polycythemia: Secondary | ICD-10-CM

## 2015-12-10 DIAGNOSIS — Z7901 Long term (current) use of anticoagulants: Secondary | ICD-10-CM

## 2015-12-10 DIAGNOSIS — D473 Essential (hemorrhagic) thrombocythemia: Secondary | ICD-10-CM

## 2015-12-10 DIAGNOSIS — D72829 Elevated white blood cell count, unspecified: Secondary | ICD-10-CM

## 2015-12-10 LAB — CBC WITH DIFFERENTIAL/PLATELET
BASO%: 1.8 % (ref 0.0–2.0)
BASOS ABS: 0.4 10*3/uL — AB (ref 0.0–0.1)
EOS ABS: 0.6 10*3/uL — AB (ref 0.0–0.5)
EOS%: 2.4 % (ref 0.0–7.0)
HCT: 54.7 % — ABNORMAL HIGH (ref 38.4–49.9)
HEMOGLOBIN: 18 g/dL — AB (ref 13.0–17.1)
LYMPH#: 2 10*3/uL (ref 0.9–3.3)
LYMPH%: 8.6 % — ABNORMAL LOW (ref 14.0–49.0)
MCH: 22.6 pg — ABNORMAL LOW (ref 27.2–33.4)
MCHC: 32.9 g/dL (ref 32.0–36.0)
MCV: 68.6 fL — ABNORMAL LOW (ref 79.3–98.0)
MONO#: 3 10*3/uL — AB (ref 0.1–0.9)
MONO%: 12.6 % (ref 0.0–14.0)
NEUT%: 74.6 % (ref 39.0–75.0)
NEUTROS ABS: 17.7 10*3/uL — AB (ref 1.5–6.5)
PLATELETS: 768 10*3/uL — AB (ref 140–400)
RBC: 7.97 10*6/uL — AB (ref 4.20–5.82)
RDW: 19.1 % — AB (ref 11.0–14.6)
WBC: 23.7 10*3/uL — AB (ref 4.0–10.3)
nRBC: 0 % (ref 0–0)

## 2015-12-10 LAB — COMPREHENSIVE METABOLIC PANEL
ALBUMIN: 4 g/dL (ref 3.5–5.0)
ALK PHOS: 95 U/L (ref 40–150)
ALT: 19 U/L (ref 0–55)
ANION GAP: 9 meq/L (ref 3–11)
AST: 28 U/L (ref 5–34)
BUN: 19.3 mg/dL (ref 7.0–26.0)
CO2: 25 meq/L (ref 22–29)
CREATININE: 1.4 mg/dL — AB (ref 0.7–1.3)
Calcium: 9.4 mg/dL (ref 8.4–10.4)
Chloride: 105 mEq/L (ref 98–109)
EGFR: 49 mL/min/{1.73_m2} — ABNORMAL LOW (ref >90)
GLUCOSE: 86 mg/dL (ref 70–140)
Potassium: 4.8 mEq/L (ref 3.5–5.1)
Sodium: 139 mEq/L (ref 136–145)
TOTAL PROTEIN: 7.4 g/dL (ref 6.4–8.3)
Total Bilirubin: 0.67 mg/dL (ref 0.20–1.20)

## 2015-12-10 LAB — TECHNOLOGIST REVIEW

## 2015-12-10 NOTE — Progress Notes (Signed)
Therapeutic phlebotomy done for 537 gms over 10 minuytes. Tolerated well. No adverse reactions. VSS after  30 minute observation time.

## 2015-12-10 NOTE — Progress Notes (Signed)
Hematology and Oncology Follow Up Visit  Gregory Blankenship [id number] 1941/08/06 75 y.o. 12/10/2015 9:18 AM   Principle Diagnosis: 75 year old gentleman with polycythemia diagnosed February of 2013 presented with a hemoglobin of 18. 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 He is also history of coronary arteries disease had a pulmonary embolus.  Current therapy:  Phlebotomy to keep his Hgb less than 17.   Interim History: Gregory Blankenship presents today for a followup visit. Since his last visit, he continues to be asymptomatic at this time. He continues to be on warfarin and aspirin. He denied any bleeding or thrombosis episodes. He has not reported any headaches or blurry vision. He has not reported any chest pain or angina. Does not report any dyspnea on exertion. He continues to perform activities of daily living without any decline. He is planning a trip to [place] in the near future.   He has not reported any fevers chills or sweats. Has not reported any nausea or vomiting. Has not reported any syncope or alteration of mental status. As that report any frequency urgency or hesitancy. He does not report any skeletal complaints of arthralgias or myalgias. Rest of his review of systems unremarkable.  Medications: I have reviewed the patient's current medications. Current Outpatient Prescriptions  Medication Sig Dispense Refill  . acetaminophen (TYLENOL) 325 MG tablet Take 650 mg by mouth every 6 (six) hours as needed for pain.    Marland Kitchen aspirin 81 MG tablet Take 81 mg by mouth daily.    . carvedilol (COREG) 6.25 MG tablet TAKE 1 TABLET 2 TIMES A DAY. 180 tablet 3  . fluorouracil (EFUDEX) 5 % cream Use as directed  0  . isosorbide mononitrate (IMDUR) 30 MG 24 hr tablet TAKE 1 TABLET BY MOUTH ONCE DAILY 30 tablet 11  . levothyroxine (SYNTHROID, LEVOTHROID) 88 MCG tablet Take 88 mcg by  mouth daily.    Marland Kitchen NITROSTAT 0.4 MG SL tablet PLACE 1 TABELT UNDER THE TONGUE EVERY 5 MINUTES AS NEEDED FOR CHEST PAIN. 25 tablet 3  . simvastatin (ZOCOR) 20 MG tablet Take 20 mg by mouth every evening. Managed by PCP, Dr. Delfina Redwood    . warfarin (COUMADIN) 5 MG tablet Take 5 mg by mouth daily. Managed by PCP, Dr. Delfina Redwood     No current facility-administered medications for this visit.    Allergies:  Allergies  Allergen Reactions  . Erythromycin Nausea Only    Past Medical History, Surgical history, Social history, and Family History were reviewed and updated.   Physical Exam:  Blood pressure 106/73, pulse 50, temperature 97.5 F (36.4 C), temperature source Oral, resp. rate 16, height 6' (1.829 m), weight 193 lb (87.544 kg), SpO2 99 %. ECOG: 1 General appearance: Healthy appearing gentleman without distress. Head: Normocephalic, atraumatic. No oral thrush noted. Neck: Supple without adenopathy.  Heart: Regular rate and rhythm. S1, S2. No murmurs or gallops. Lungs: Clear to auscultation. No rhonchi, wheeze, no dullness to percussion.  Abdomen: Soft, nontender. No hepatosplenomegaly. No rebound or guarding or shifting dullness. Extremities: No clubbing, cyanosis, or edema.  Neurologic: No deficits noted.   Lab Results: Lab Results  Component Value Date   WBC 23.7* 12/10/2015   HGB 18.0* 12/10/2015   HCT 54.7* 12/10/2015   MCV 68.6* 12/10/2015   PLT 768* 12/10/2015     Chemistry      Component Value Date/Time   NA 139  08/19/2015 0812   NA 138 05/23/2012 0853   K 4.7 08/19/2015 0812   K 5.4* 05/23/2012 0853   CL 102 02/05/2013 1429   CL 101 05/23/2012 0853   CO2 26 08/19/2015 0812   CO2 28 05/23/2012 0853   BUN 20.6 08/19/2015 0812   BUN 22 05/23/2012 0853   CREATININE 1.3 08/19/2015 0812   CREATININE 1.26 05/23/2012 0853      Component Value Date/Time   CALCIUM 9.4 08/19/2015 0812   CALCIUM 9.4 05/23/2012 0853   ALKPHOS 81 08/19/2015 0812   ALKPHOS 86 08/10/2015  0813   AST 24 08/19/2015 0812   AST 23 08/10/2015 0813   ALT 16 08/19/2015 0812   ALT 15 08/10/2015 0813   BILITOT 0.56 08/19/2015 0812   BILITOT 0.6 08/10/2015 0813      Impression and Plan:  75 year old old with the following issues:  1. 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 His Hgb today is within Is elevated and would benefit from phlebotomy. Risks and benefits of this procedure was discussed again and he is agreeable to proceed. The plan is to continue to monitor him every 3 months and institute phlebotomy as needed.   2. Thrombosis prophylaxis: He is continued to be anticoagulated with Coumadin at low-dose aspirin.  3. 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 For the time being we'll continue to monitor this counts.  4. Follow-up: With 3 months.  [facility], MD 3/3/20179:18 AM

## 2015-12-10 NOTE — Telephone Encounter (Signed)
Gave and printed appt sched and avs for pt for June °

## 2015-12-10 NOTE — Patient Instructions (Signed)

## 2015-12-15 DIAGNOSIS — C61 Malignant neoplasm of prostate: Secondary | ICD-10-CM | POA: Diagnosis not present

## 2015-12-15 DIAGNOSIS — Z0000 Encounter for general adult medical examination without abnormal findings: Secondary | ICD-10-CM | POA: Diagnosis not present

## 2015-12-16 DIAGNOSIS — Z7901 Long term (current) use of anticoagulants: Secondary | ICD-10-CM | POA: Diagnosis not present

## 2015-12-28 ENCOUNTER — Other Ambulatory Visit: Payer: Self-pay | Admitting: Cardiovascular Disease

## 2015-12-28 NOTE — Telephone Encounter (Signed)
Rx request sent to pharmacy.  

## 2015-12-29 DIAGNOSIS — Z7901 Long term (current) use of anticoagulants: Secondary | ICD-10-CM | POA: Diagnosis not present

## 2016-02-11 DIAGNOSIS — Z7901 Long term (current) use of anticoagulants: Secondary | ICD-10-CM | POA: Diagnosis not present

## 2016-03-02 DIAGNOSIS — L723 Sebaceous cyst: Secondary | ICD-10-CM | POA: Diagnosis not present

## 2016-03-02 DIAGNOSIS — Z85828 Personal history of other malignant neoplasm of skin: Secondary | ICD-10-CM | POA: Diagnosis not present

## 2016-03-02 DIAGNOSIS — D1801 Hemangioma of skin and subcutaneous tissue: Secondary | ICD-10-CM | POA: Diagnosis not present

## 2016-03-02 DIAGNOSIS — L821 Other seborrheic keratosis: Secondary | ICD-10-CM | POA: Diagnosis not present

## 2016-03-02 DIAGNOSIS — L570 Actinic keratosis: Secondary | ICD-10-CM | POA: Diagnosis not present

## 2016-03-02 DIAGNOSIS — D485 Neoplasm of uncertain behavior of skin: Secondary | ICD-10-CM | POA: Diagnosis not present

## 2016-03-02 DIAGNOSIS — L820 Inflamed seborrheic keratosis: Secondary | ICD-10-CM | POA: Diagnosis not present

## 2016-03-07 DIAGNOSIS — D45 Polycythemia vera: Secondary | ICD-10-CM | POA: Diagnosis not present

## 2016-03-07 DIAGNOSIS — E039 Hypothyroidism, unspecified: Secondary | ICD-10-CM | POA: Diagnosis not present

## 2016-03-07 DIAGNOSIS — I2510 Atherosclerotic heart disease of native coronary artery without angina pectoris: Secondary | ICD-10-CM | POA: Diagnosis not present

## 2016-03-07 DIAGNOSIS — Z1389 Encounter for screening for other disorder: Secondary | ICD-10-CM | POA: Diagnosis not present

## 2016-03-07 DIAGNOSIS — Z0000 Encounter for general adult medical examination without abnormal findings: Secondary | ICD-10-CM | POA: Diagnosis not present

## 2016-03-07 DIAGNOSIS — Z86711 Personal history of pulmonary embolism: Secondary | ICD-10-CM | POA: Diagnosis not present

## 2016-03-07 DIAGNOSIS — Z8546 Personal history of malignant neoplasm of prostate: Secondary | ICD-10-CM | POA: Diagnosis not present

## 2016-03-07 DIAGNOSIS — Z7901 Long term (current) use of anticoagulants: Secondary | ICD-10-CM | POA: Diagnosis not present

## 2016-03-13 DIAGNOSIS — M7989 Other specified soft tissue disorders: Secondary | ICD-10-CM | POA: Diagnosis not present

## 2016-03-16 ENCOUNTER — Ambulatory Visit (HOSPITAL_BASED_OUTPATIENT_CLINIC_OR_DEPARTMENT_OTHER): Payer: Medicare Other | Admitting: Oncology

## 2016-03-16 ENCOUNTER — Telehealth: Payer: Self-pay | Admitting: *Deleted

## 2016-03-16 ENCOUNTER — Telehealth: Payer: Self-pay | Admitting: Oncology

## 2016-03-16 ENCOUNTER — Other Ambulatory Visit (HOSPITAL_BASED_OUTPATIENT_CLINIC_OR_DEPARTMENT_OTHER): Payer: Medicare Other

## 2016-03-16 ENCOUNTER — Ambulatory Visit (HOSPITAL_BASED_OUTPATIENT_CLINIC_OR_DEPARTMENT_OTHER): Payer: Medicare Other

## 2016-03-16 VITALS — BP 105/78 | HR 51 | Resp 17

## 2016-03-16 VITALS — BP 107/69 | HR 57 | Temp 97.4°F | Resp 18 | Ht 72.0 in | Wt 190.7 lb

## 2016-03-16 DIAGNOSIS — D45 Polycythemia vera: Secondary | ICD-10-CM

## 2016-03-16 DIAGNOSIS — Z7901 Long term (current) use of anticoagulants: Secondary | ICD-10-CM

## 2016-03-16 DIAGNOSIS — D72829 Elevated white blood cell count, unspecified: Secondary | ICD-10-CM

## 2016-03-16 DIAGNOSIS — D751 Secondary polycythemia: Secondary | ICD-10-CM

## 2016-03-16 LAB — CBC WITH DIFFERENTIAL/PLATELET
BASO%: 0.4 % (ref 0.0–2.0)
BASOS ABS: 0.1 10*3/uL (ref 0.0–0.1)
EOS ABS: 0.6 10*3/uL — AB (ref 0.0–0.5)
EOS%: 2.1 % (ref 0.0–7.0)
HCT: 53.9 % — ABNORMAL HIGH (ref 38.4–49.9)
HEMOGLOBIN: 16.7 g/dL (ref 13.0–17.1)
LYMPH%: 7.7 % — ABNORMAL LOW (ref 14.0–49.0)
MCH: 21.3 pg — AB (ref 27.2–33.4)
MCHC: 30.9 g/dL — ABNORMAL LOW (ref 32.0–36.0)
MCV: 69 fL — AB (ref 79.3–98.0)
MONO#: 2.9 10*3/uL — AB (ref 0.1–0.9)
MONO%: 11 % (ref 0.0–14.0)
NEUT%: 78.8 % — ABNORMAL HIGH (ref 39.0–75.0)
NEUTROS ABS: 20.6 10*3/uL — AB (ref 1.5–6.5)
Platelets: 686 10*3/uL — ABNORMAL HIGH (ref 140–400)
RBC: 7.81 10*6/uL — AB (ref 4.20–5.82)
RDW: 19 % — AB (ref 11.0–14.6)
WBC: 26.1 10*3/uL — AB (ref 4.0–10.3)
lymph#: 2 10*3/uL (ref 0.9–3.3)

## 2016-03-16 LAB — COMPREHENSIVE METABOLIC PANEL
ALT: 20 U/L (ref 0–55)
AST: 26 U/L (ref 5–34)
Albumin: 3.9 g/dL (ref 3.5–5.0)
Alkaline Phosphatase: 74 U/L (ref 40–150)
Anion Gap: 8 mEq/L (ref 3–11)
BUN: 20.5 mg/dL (ref 7.0–26.0)
CO2: 26 meq/L (ref 22–29)
Calcium: 9.3 mg/dL (ref 8.4–10.4)
Chloride: 104 mEq/L (ref 98–109)
Creatinine: 1.3 mg/dL (ref 0.7–1.3)
EGFR: 55 mL/min/{1.73_m2} — AB (ref >90)
GLUCOSE: 61 mg/dL — AB (ref 70–140)
POTASSIUM: 4.5 meq/L (ref 3.5–5.1)
SODIUM: 139 meq/L (ref 136–145)
TOTAL PROTEIN: 7.1 g/dL (ref 6.4–8.3)
Total Bilirubin: 0.66 mg/dL (ref 0.20–1.20)

## 2016-03-16 LAB — TECHNOLOGIST REVIEW

## 2016-03-16 NOTE — Progress Notes (Signed)
Therapeutic phlebotomy performed with 16 needle to R AC yielding 536grams started at 1037 and ending at 1044. Pt tolerated well and given snack and beverage.

## 2016-03-16 NOTE — Progress Notes (Signed)
Hematology and Oncology Follow Up Visit  Gregory Blankenship [id number] 1941-07-10 75 y.o. 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 He has JAK 2 positive mutation  Secondary diagnosis: History of prostate cancer diagnosed in 2011. He is also history of coronary arteries disease had a pulmonary embolus.  Current therapy:  Phlebotomy to keep his Hgb less than 17 and hematocrit less than 45.  Interim History: Gregory Blankenship presents today for a followup visit. Since his last visit, he reports feeling very well. He does report increased erythema and swelling in one of his fingers. He denied any trauma or infection. He denied any other symptoms such as weight loss or appetite changes. He denied any abdominal pain or early satiety.   He continues to be on warfarin and aspirin. He denied any bleeding or thrombosis episodes. He has not reported any headaches or blurry vision. He has not reported any chest pain or angina. Does not report any dyspnea on exertion. He continues to perform activities of daily living without any decline.   He has not reported any fevers chills or sweats. Has not reported any nausea or vomiting. Has not reported any syncope or alteration of mental status. As that report any frequency urgency or hesitancy. He does not report any skeletal complaints of arthralgias or myalgias. Rest of his review of systems unremarkable.  Medications: I have reviewed the patient's current medications. Current Outpatient Prescriptions  Medication Sig Dispense Refill  . acetaminophen (TYLENOL) 325 MG tablet Take 650 mg by mouth every 6 (six) hours as needed for pain.    Marland Kitchen aspirin 81 MG tablet Take 81 mg by mouth daily.    . carvedilol (COREG) 6.25 MG tablet TAKE 1 TABLET 2 TIMES A DAY. 180 tablet 3  . fluorouracil (EFUDEX) 5 % cream Use as directed  0  . isosorbide mononitrate (IMDUR) 30 MG 24 hr  tablet TAKE 1 TABLET BY MOUTH ONCE DAILY 90 tablet 2  . levothyroxine (SYNTHROID, LEVOTHROID) 88 MCG tablet Take 88 mcg by mouth daily.    Marland Kitchen NITROSTAT 0.4 MG SL tablet PLACE 1 TABELT UNDER THE TONGUE EVERY 5 MINUTES AS NEEDED FOR CHEST PAIN. 25 tablet 3  . simvastatin (ZOCOR) 20 MG tablet Take 20 mg by mouth every evening. Managed by PCP, Dr. Delfina Redwood    . warfarin (COUMADIN) 5 MG tablet Take 5 mg by mouth daily. Managed by PCP, Dr. Delfina Redwood     No current facility-administered medications for this visit.    Allergies:  Allergies  Allergen Reactions  . Erythromycin Nausea Only    Past Medical History, Surgical history, Social history, and Family History were reviewed and updated.   Physical Exam:  Blood pressure 107/69, pulse 57, temperature 97.4 F (36.3 C), temperature source Oral, resp. rate 18, height 6' (1.829 m), weight 190 lb 11.2 oz (86.501 kg), SpO2 99 %. ECOG: 1 General appearance: Pleasant-appearing gentleman without distress.. Head: Normocephalic, atraumatic. No oral thrush noted. Neck: Supple without adenopathy.  Heart: Regular rate and rhythm. S1, S2. No murmurs or gallops. Lungs: Clear to auscultation. No rhonchi, wheeze, no dullness to percussion.  Abdomen: Soft, nontender. No hepatosplenomegaly. No rebound or guarding or shifting dullness. Extremities: No clubbing, cyanosis, or edema. Erythema and mild swelling noted on one of his fingers in the left hand. Did not appear to be warm to touch. Neurologic: No deficits noted.   Lab Results: Lab Results  Component  Value Date   WBC 26.1* 03/16/2016   HGB 16.7 03/16/2016   HCT 53.9* 03/16/2016   MCV 69.0* 03/16/2016   PLT 686* 03/16/2016     Chemistry      Component Value Date/Time   NA 139 12/10/2015 0842   NA 138 05/23/2012 0853   K 4.8 12/10/2015 0842   K 5.4* 05/23/2012 0853   CL 102 02/05/2013 1429   CL 101 05/23/2012 0853   CO2 25 12/10/2015 0842   CO2 28 05/23/2012 0853   BUN 19.3 12/10/2015 0842    BUN 22 05/23/2012 0853   CREATININE 1.4* 12/10/2015 0842   CREATININE 1.26 05/23/2012 0853      Component Value Date/Time   CALCIUM 9.4 12/10/2015 0842   CALCIUM 9.4 05/23/2012 0853   ALKPHOS 95 12/10/2015 0842   ALKPHOS 86 08/10/2015 0813   AST 28 12/10/2015 0842   AST 23 08/10/2015 0813   ALT 19 12/10/2015 0842   ALT 15 08/10/2015 0813   BILITOT 0.67 12/10/2015 0842   BILITOT 0.6 08/10/2015 0813      Impression and Plan:  75 year old old with the following issues:  1. 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 His Hgb today is improving and getting closer to the therapeutic target. I recommended continued phlebotomy to get his hematocrit close to 45. He does not report any other specific symptoms related to polycythemia. He does have swelling in his finger which could be a sign of worsening polycythemia and Erthromelagia.    2. Thrombosis prophylaxis: He is continued to be anticoagulated with Coumadin andlow-dose aspirin.  3. Cytoreduction strategies: I discussed the risks and benefits of using hydroxyurea for cytoreduction purposes. Indication would be increase platelet count and leukocytosis which will increase his risk of thrombosis. He is protected with warfarin as mentioned but certainly would be at increased risk of arterial thrombosis and bleeding of his prior counts continue to rise. I recommended starting hydroxyurea if his platelet counts is above 800.  4. Follow-up: With 3 months.  [id number], MD 6/8/201710:10 AM

## 2016-03-16 NOTE — Telephone Encounter (Signed)
Per staff message and POF I have scheduled appts. Advised scheduler of appts. JMW  

## 2016-03-16 NOTE — Telephone Encounter (Signed)
per pof to sch pt appt-gave pt copy of avs °

## 2016-03-24 DIAGNOSIS — L03011 Cellulitis of right finger: Secondary | ICD-10-CM | POA: Diagnosis not present

## 2016-03-24 DIAGNOSIS — Z85828 Personal history of other malignant neoplasm of skin: Secondary | ICD-10-CM | POA: Diagnosis not present

## 2016-04-03 ENCOUNTER — Other Ambulatory Visit: Payer: Self-pay

## 2016-04-04 DIAGNOSIS — M79644 Pain in right finger(s): Secondary | ICD-10-CM | POA: Diagnosis not present

## 2016-04-06 DIAGNOSIS — Z7901 Long term (current) use of anticoagulants: Secondary | ICD-10-CM | POA: Diagnosis not present

## 2016-04-06 DIAGNOSIS — E039 Hypothyroidism, unspecified: Secondary | ICD-10-CM | POA: Diagnosis not present

## 2016-04-07 DIAGNOSIS — E039 Hypothyroidism, unspecified: Secondary | ICD-10-CM | POA: Diagnosis not present

## 2016-04-13 ENCOUNTER — Other Ambulatory Visit: Payer: Self-pay | Admitting: Gastroenterology

## 2016-04-18 DIAGNOSIS — M79644 Pain in right finger(s): Secondary | ICD-10-CM | POA: Diagnosis not present

## 2016-04-20 DIAGNOSIS — Z7901 Long term (current) use of anticoagulants: Secondary | ICD-10-CM | POA: Diagnosis not present

## 2016-05-11 ENCOUNTER — Encounter (HOSPITAL_COMMUNITY): Payer: Self-pay | Admitting: *Deleted

## 2016-05-15 NOTE — Anesthesia Preprocedure Evaluation (Addendum)
Anesthesia Evaluation  Patient identified by MRN, date of birth, ID band Patient awake    Reviewed: Allergy & Precautions, NPO status , Patient's Chart, lab work & pertinent test results, reviewed documented beta blocker date and time   History of Anesthesia Complications Negative for: history of anesthetic complications  Airway Mallampati: II  TM Distance: >3 FB Neck ROM: Full    Dental  (+) Dental Advisory Given, Teeth Intact   Pulmonary neg shortness of breath, neg sleep apnea, neg COPD, neg recent URI, former smoker,    Pulmonary exam normal breath sounds clear to auscultation       Cardiovascular hypertension, Pt. on medications and Pt. 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), ,   Anesthesia Other Findings HLD  Reproductive/Obstetrics                            Anesthesia Physical Anesthesia Plan  ASA: III  Anesthesia Plan: MAC   Post-op Pain Management:    Induction: Intravenous  Airway Management Planned: Natural Airway and Nasal Cannula  Additional Equipment:   Intra-op Plan:   Post-operative Plan:   Informed Consent: I have reviewed the patients History and Physical, chart, labs and discussed the procedure including the risks, benefits and alternatives for the proposed anesthesia with the patient or authorized representative who has indicated his/her understanding and acceptance.   Dental advisory given  Plan Discussed with:    Anesthesia Plan Comments:        Anesthesia Quick Evaluation

## 2016-05-16 ENCOUNTER — Ambulatory Visit (HOSPITAL_COMMUNITY): Payer: Medicare Other | Admitting: Anesthesiology

## 2016-05-16 ENCOUNTER — Encounter (HOSPITAL_COMMUNITY)
Admission: RE | Disposition: A | Discharge status: Home / Self Care | Payer: Self-pay | Source: Ambulatory Visit | Attending: Gastroenterology

## 2016-05-16 ENCOUNTER — Encounter (HOSPITAL_COMMUNITY): Payer: Self-pay

## 2016-05-16 ENCOUNTER — Ambulatory Visit (HOSPITAL_COMMUNITY)
Admission: RE | Admit: 2016-05-16 | Discharge: 2016-05-16 | Disposition: A | Discharge status: Home / Self Care | Payer: Medicare Other | Source: Ambulatory Visit | Attending: Gastroenterology | Admitting: Gastroenterology

## 2016-05-16 DIAGNOSIS — Z86711 Personal history of pulmonary embolism: Secondary | ICD-10-CM | POA: Diagnosis not present

## 2016-05-16 DIAGNOSIS — E785 Hyperlipidemia, unspecified: Secondary | ICD-10-CM | POA: Diagnosis not present

## 2016-05-16 DIAGNOSIS — Z79899 Other long term (current) drug therapy: Secondary | ICD-10-CM | POA: Insufficient documentation

## 2016-05-16 DIAGNOSIS — Z86010 Personal history of colonic polyps: Secondary | ICD-10-CM | POA: Insufficient documentation

## 2016-05-16 DIAGNOSIS — E7800 Pure hypercholesterolemia, unspecified: Secondary | ICD-10-CM | POA: Diagnosis not present

## 2016-05-16 DIAGNOSIS — I739 Peripheral vascular disease, unspecified: Secondary | ICD-10-CM | POA: Insufficient documentation

## 2016-05-16 DIAGNOSIS — Z9079 Acquired absence of other genital organ(s): Secondary | ICD-10-CM | POA: Diagnosis not present

## 2016-05-16 DIAGNOSIS — Z7982 Long term (current) use of aspirin: Secondary | ICD-10-CM | POA: Diagnosis not present

## 2016-05-16 DIAGNOSIS — E039 Hypothyroidism, unspecified: Secondary | ICD-10-CM | POA: Insufficient documentation

## 2016-05-16 DIAGNOSIS — Z8546 Personal history of malignant neoplasm of prostate: Secondary | ICD-10-CM | POA: Diagnosis not present

## 2016-05-16 DIAGNOSIS — Z87891 Personal history of nicotine dependence: Secondary | ICD-10-CM | POA: Insufficient documentation

## 2016-05-16 DIAGNOSIS — Z7901 Long term (current) use of anticoagulants: Secondary | ICD-10-CM | POA: Diagnosis not present

## 2016-05-16 DIAGNOSIS — I10 Essential (primary) hypertension: Secondary | ICD-10-CM | POA: Insufficient documentation

## 2016-05-16 DIAGNOSIS — I2510 Atherosclerotic heart disease of native coronary artery without angina pectoris: Secondary | ICD-10-CM | POA: Insufficient documentation

## 2016-05-16 DIAGNOSIS — I252 Old myocardial infarction: Secondary | ICD-10-CM | POA: Diagnosis not present

## 2016-05-16 DIAGNOSIS — Z1211 Encounter for screening for malignant neoplasm of colon: Secondary | ICD-10-CM | POA: Insufficient documentation

## 2016-05-16 HISTORY — DX: Acute myocardial infarction, unspecified: I21.9

## 2016-05-16 HISTORY — PX: COLONOSCOPY WITH PROPOFOL: SHX5780

## 2016-05-16 HISTORY — DX: Malignant (primary) neoplasm, unspecified: C80.1

## 2016-05-16 HISTORY — DX: Unspecified osteoarthritis, unspecified site: M19.90

## 2016-05-16 SURGERY — COLONOSCOPY WITH PROPOFOL
Anesthesia: Monitor Anesthesia Care

## 2016-05-16 MED ORDER — LIDOCAINE HCL (CARDIAC) 20 MG/ML IV SOLN
INTRAVENOUS | Status: DC | PRN
  Administered 2016-05-16: 100 mg via INTRAVENOUS

## 2016-05-16 MED ORDER — ATROPINE SULFATE 0.4 MG/ML IJ SOLN
INTRAMUSCULAR | Status: AC
  Filled 2016-05-16: qty 1

## 2016-05-16 MED ORDER — PROPOFOL 10 MG/ML IV BOLUS
INTRAVENOUS | Status: DC | PRN
  Administered 2016-05-16: 20 mg via INTRAVENOUS
  Administered 2016-05-16 (×3): 10 mg via INTRAVENOUS
  Administered 2016-05-16: 20 mg via INTRAVENOUS
  Administered 2016-05-16: 10 mg via INTRAVENOUS

## 2016-05-16 MED ORDER — ONDANSETRON HCL 4 MG/2ML IJ SOLN
INTRAMUSCULAR | Status: DC | PRN
  Administered 2016-05-16: 4 mg via INTRAVENOUS

## 2016-05-16 MED ORDER — EPHEDRINE SULFATE 50 MG/ML IJ SOLN
INTRAMUSCULAR | Status: AC
  Filled 2016-05-16: qty 1

## 2016-05-16 MED ORDER — LIDOCAINE HCL (CARDIAC) 20 MG/ML IV SOLN
INTRAVENOUS | Status: AC
  Filled 2016-05-16: qty 5

## 2016-05-16 MED ORDER — PROPOFOL 500 MG/50ML IV EMUL
INTRAVENOUS | Status: DC | PRN
  Administered 2016-05-16: 75 ug/kg/min via INTRAVENOUS

## 2016-05-16 MED ORDER — SODIUM CHLORIDE 0.9 % IJ SOLN
INTRAMUSCULAR | Status: AC
  Filled 2016-05-16: qty 10

## 2016-05-16 MED ORDER — SODIUM CHLORIDE 0.9 % IV SOLN: INTRAVENOUS | Status: DC

## 2016-05-16 MED ORDER — PROPOFOL 10 MG/ML IV BOLUS
INTRAVENOUS | Status: AC
  Filled 2016-05-16: qty 60

## 2016-05-16 MED ORDER — ONDANSETRON HCL 4 MG/2ML IJ SOLN
INTRAMUSCULAR | Status: AC
  Filled 2016-05-16: qty 2

## 2016-05-16 MED ORDER — LACTATED RINGERS IV SOLN
INTRAVENOUS | Status: DC
Start: 2016-05-16 — End: 2016-05-16
  Administered 2016-05-16: 12:00:00 via INTRAVENOUS

## 2016-05-16 SURGICAL SUPPLY — 21 items

## 2016-05-16 NOTE — Anesthesia Postprocedure Evaluation (Signed)
Anesthesia Post Note  Patient: Gregory Blankenship  Procedure(s) Performed: Procedure(s) (LRB): COLONOSCOPY WITH PROPOFOL (N/A)  Patient location during evaluation: PACU Anesthesia Type: MAC Level of consciousness: awake and alert Pain management: pain level controlled Vital Signs Assessment: post-procedure vital signs reviewed and stable Respiratory status: spontaneous breathing, nonlabored ventilation and respiratory function stable Cardiovascular status: stable and blood pressure returned to baseline Anesthetic complications: no    Last Vitals:  Vitals:   05/16/16 1146 05/16/16 1334  BP: 124/79 96/62  Pulse: (!) 53 (!) 57  Resp: 17 17  Temp: 36.5 C 36.4 C    Last Pain:  Vitals:   05/16/16 1334  TempSrc: Oral                 Nilda Simmer

## 2016-05-16 NOTE — Discharge Instructions (Signed)

## 2016-05-16 NOTE — Transfer of Care (Signed)
Immediate Anesthesia Transfer of Care Note  Patient: Gregory Blankenship  Procedure(s) Performed: Procedure(s): COLONOSCOPY WITH PROPOFOL (N/A)  Patient Location: ENDO  Anesthesia Type:MAC  Level of Consciousness:  sedated, patient cooperative and responds to stimulation  Airway & Oxygen Therapy:Patient Spontanous Breathing and Patient connected to face mask oxgen  Post-op Assessment:  Report given to ENDO RN and Post -op Vital signs reviewed and stable  Post vital signs:  Reviewed and stable  Last Vitals:  Vitals:   05/16/16 1146  BP: 124/79  Pulse: (!) 53  Resp: 17  Temp: [id number] C    Complications: No apparent anesthesia complications

## 2016-05-16 NOTE — Op Note (Signed)
[facility] Patient Name: Gregory Blankenship Procedure Date: 05/16/2016 MRN: [id number] Attending MD: Garlan Fair , MD Date of Birth: 09-08-1941 CSN: [id number] Age: 75 Admit Type: Outpatient Procedure:                Colonoscopy Indications:              High risk colon cancer surveillance: Personal                            history of sessile serrated colon polyp (less than                            10 mm in size) with no dysplasia Providers:                Garlan Fair, MD, Laverta Baltimore RN, RN,                            Despina Pole Tech, Technician, Adair Laundry, CRNA Referring MD:              Medicines:                Propofol per Anesthesia Complications:            No immediate complications. Estimated Blood Loss:     Estimated blood loss: none. Procedure:                Pre-Anesthesia Assessment:                           - Prior to the procedure, a History and Physical                            was performed, and patient medications and                            allergies were reviewed. The patient's tolerance of                            previous anesthesia was also reviewed. The risks                            and benefits of the procedure and the sedation                            options and risks were discussed with the patient.                            All questions were answered, and informed consent                            was obtained. Prior Anticoagulants: The patient has                            taken Lovenox (enoxaparin), last dose was 1 day  prior to procedure. ASA Grade Assessment: II - A                            patient with mild systemic disease. After reviewing                            the risks and benefits, the patient was deemed in                            satisfactory condition to undergo the procedure.                           After obtaining informed consent, the colonoscope                      was passed under direct vision. Throughout the                            procedure, the patient's blood pressure, pulse, and                            oxygen saturations were monitored continuously. The                            [id number] scope was introduced through                            the anus and advanced to the the cecum, identified                            by appendiceal orifice and ileocecal valve. The                            colonoscopy was performed without difficulty. The                            patient tolerated the procedure well. The quality                            of the bowel preparation was good. The appendiceal                            orifice and the rectum were photographed. Scope In: 1:08:18 PM Scope Out: 1:26:40 PM Scope Withdrawal Time: 0 hours 9 minutes 48 seconds  Total Procedure Duration: 0 hours 18 minutes 22 seconds  Findings:      The perianal and digital rectal examinations were normal.      The entire examined colon appeared normal. Impression:               - The entire examined colon is normal.                           - No specimens collected. Moderate Sedation:      N/A- Per Anesthesia Care Recommendation:           -  Patient has a contact number available for                            emergencies. The signs and symptoms of potential                            delayed complications were discussed with the                            patient. Return to normal activities tomorrow.                            Written discharge instructions were provided to the                            patient.                           - Repeat colonoscopy is not recommended for                            surveillance.                           - Resume previous diet.                           - Continue present medications. Procedure Code(s):        --- Professional ---                           [id number], Colorectal  cancer screening; colonoscopy on                            individual at high risk Diagnosis Code(s):        --- Professional ---                           Z86.010, Personal history of colonic polyps CPT copyright 2016 American Medical Association. All rights reserved. The codes documented in this report are preliminary and upon coder review may  be revised to meet current compliance requirements. Earle Gell, MD Garlan Fair, MD 05/16/2016 1:33:39 PM This report has been signed electronically. Number of Addenda: 0

## 2016-05-16 NOTE — H&P (Signed)
  Procedure: Surveillance colonoscopy. 02/19/2013 colonoscopy was performed with removal of tubular adenomatous and sessile serrated adenomatous colon polyps. Chronic Coumadin anticoagulation due to a history of bilateral pulmonary emboli  History: The patient is a 75 year old male born July 15, 1941. He is scheduled to undergo a surveillance colonoscopy today. He stopped taking Coumadin 5 days ago. He administered Lovenox at 8 PM yesterday.  Past medical history: Hypothyroidism. Hypercholesterolemia. Bilateral pulmonary emboli. Prostatectomy performed to treat prostate cancer. Varicose vein surgery.  Medication allergies: Erythromycin caused nausea  Exam: The patient is alert and lying comfortably on the endoscopy stretcher. Vitamin is soft and nontender to palpation. Lungs are clear to auscultation. Cardiac exam reveals a regular rhythm.  Plan: Proceed with surveillance colonoscopy

## 2016-05-17 ENCOUNTER — Encounter (HOSPITAL_COMMUNITY): Payer: Self-pay | Admitting: Gastroenterology

## 2016-05-22 DIAGNOSIS — E039 Hypothyroidism, unspecified: Secondary | ICD-10-CM | POA: Diagnosis not present

## 2016-06-01 DIAGNOSIS — Z7901 Long term (current) use of anticoagulants: Secondary | ICD-10-CM | POA: Diagnosis not present

## 2016-06-09 DIAGNOSIS — M79675 Pain in left toe(s): Secondary | ICD-10-CM | POA: Diagnosis not present

## 2016-06-15 ENCOUNTER — Telehealth: Payer: Self-pay | Admitting: Oncology

## 2016-06-15 ENCOUNTER — Other Ambulatory Visit (HOSPITAL_BASED_OUTPATIENT_CLINIC_OR_DEPARTMENT_OTHER): Payer: Medicare Other

## 2016-06-15 ENCOUNTER — Ambulatory Visit (HOSPITAL_BASED_OUTPATIENT_CLINIC_OR_DEPARTMENT_OTHER): Payer: Medicare Other | Admitting: Oncology

## 2016-06-15 VITALS — BP 98/72 | HR 60 | Temp 97.6°F | Resp 18 | Wt 189.2 lb

## 2016-06-15 DIAGNOSIS — Z86711 Personal history of pulmonary embolism: Secondary | ICD-10-CM | POA: Diagnosis not present

## 2016-06-15 DIAGNOSIS — Z8546 Personal history of malignant neoplasm of prostate: Secondary | ICD-10-CM

## 2016-06-15 DIAGNOSIS — Z7901 Long term (current) use of anticoagulants: Secondary | ICD-10-CM

## 2016-06-15 DIAGNOSIS — D45 Polycythemia vera: Secondary | ICD-10-CM

## 2016-06-15 DIAGNOSIS — I2699 Other pulmonary embolism without acute cor pulmonale: Secondary | ICD-10-CM

## 2016-06-15 LAB — CBC WITH DIFFERENTIAL/PLATELET
BASO%: 0.5 % (ref 0.0–2.0)
Basophils Absolute: 0.2 10*3/uL — ABNORMAL HIGH (ref 0.0–0.1)
EOS%: 1.4 % (ref 0.0–7.0)
Eosinophils Absolute: 0.4 10*3/uL (ref 0.0–0.5)
HCT: 51.5 % — ABNORMAL HIGH (ref 38.4–49.9)
HGB: 17.2 g/dL — ABNORMAL HIGH (ref 13.0–17.1)
LYMPH#: 1.7 10*3/uL (ref 0.9–3.3)
LYMPH%: 5.7 % — ABNORMAL LOW (ref 14.0–49.0)
MCH: 22.8 pg — ABNORMAL LOW (ref 27.2–33.4)
MCHC: 33.4 g/dL (ref 32.0–36.0)
MCV: 68.1 fL — AB (ref 79.3–98.0)
MONO#: 4.7 10*3/uL — ABNORMAL HIGH (ref 0.1–0.9)
MONO%: 16.1 % — AB (ref 0.0–14.0)
NEUT%: 76.3 % — ABNORMAL HIGH (ref 39.0–75.0)
NEUTROS ABS: 22.5 10*3/uL — AB (ref 1.5–6.5)
NRBC: 0 % (ref 0–0)
Platelets: 575 10*3/uL — ABNORMAL HIGH (ref 140–400)
RBC: 7.56 10*6/uL — AB (ref 4.20–5.82)
RDW: 18.6 % — AB (ref 11.0–14.6)
WBC: 29.5 10*3/uL — AB (ref 4.0–10.3)

## 2016-06-15 LAB — COMPREHENSIVE METABOLIC PANEL
ALT: 16 U/L (ref 0–55)
AST: 18 U/L (ref 5–34)
Albumin: 3.5 g/dL (ref 3.5–5.0)
Alkaline Phosphatase: 80 U/L (ref 40–150)
Anion Gap: 11 mEq/L (ref 3–11)
BILIRUBIN TOTAL: 0.69 mg/dL (ref 0.20–1.20)
BUN: 24.8 mg/dL (ref 7.0–26.0)
CO2: 25 meq/L (ref 22–29)
Calcium: 9.5 mg/dL (ref 8.4–10.4)
Chloride: 104 mEq/L (ref 98–109)
Creatinine: 1.5 mg/dL — ABNORMAL HIGH (ref 0.7–1.3)
EGFR: 45 mL/min/{1.73_m2} — AB (ref >90)
GLUCOSE: 95 mg/dL (ref 70–140)
Potassium: 4.2 mEq/L (ref 3.5–5.1)
SODIUM: 140 meq/L (ref 136–145)
TOTAL PROTEIN: 6.7 g/dL (ref 6.4–8.3)

## 2016-06-15 LAB — TECHNOLOGIST REVIEW

## 2016-06-15 NOTE — Progress Notes (Signed)
Hematology and Oncology Follow Up Visit  Gregory Blankenship [id number] 05/27/1941 75 y.o. 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 He has JAK 2 positive mutation  Secondary diagnosis: History of prostate cancer diagnosed in 2011. He is also history of coronary arteries disease had a pulmonary embolus.  Current therapy:  Phlebotomy to keep his Hgb less than 17 and hematocrit less than 45.  Interim History: Mr. Gregory Blankenship presents today for a followup visit. Since his last visit, he does not report any changes in his health. He continues to be very active and does not report any decline in his energy her performance status. He denied any constitutional symptoms of early satiety, weight loss or appetite changes. He denied any headaches or migraines. He denied any thrombosis or bleeding episodes. He continues to be on warfarin and aspirin.   He has not reported any fevers chills or sweats. Has not reported any nausea or vomiting. Has not reported any syncope or alteration of mental status. As that report any frequency urgency or hesitancy. He does not report any skeletal complaints of arthralgias or myalgias. Rest of his review of systems unremarkable.  Medications: I have reviewed the patient's current medications. Current Outpatient Prescriptions  Medication Sig Dispense Refill  . acetaminophen (TYLENOL) 325 MG tablet Take 650 mg by mouth every 6 (six) hours as needed for pain.    Marland Kitchen aspirin 81 MG tablet Take 81 mg by mouth daily.    . carvedilol (COREG) 6.25 MG tablet TAKE 1 TABLET 2 TIMES A DAY. 180 tablet 3  . isosorbide mononitrate (IMDUR) 30 MG 24 hr tablet TAKE 1 TABLET BY MOUTH ONCE DAILY 90 tablet 2  . levothyroxine (SYNTHROID, LEVOTHROID) 100 MCG tablet Take 100 mcg by mouth daily.  3  . simvastatin (ZOCOR) 20 MG tablet Take 20 mg by mouth every evening. Managed by PCP, Dr. Delfina Redwood    .  warfarin (COUMADIN) 5 MG tablet Take 5 mg by mouth daily. Managed by PCP, Dr. Delfina Redwood    . NITROSTAT 0.4 MG SL tablet PLACE 1 TABELT UNDER THE TONGUE EVERY 5 MINUTES AS NEEDED FOR CHEST PAIN. (Patient not taking: Reported on 06/15/2016) 25 tablet 3   No current facility-administered medications for this visit.     Allergies:  Allergies  Allergen Reactions  . Erythromycin Nausea Only    Past Medical History, Surgical history, Social history, and Family History were reviewed and updated.   Physical Exam:  Blood pressure 98/72, pulse 60, temperature 97.6 F (36.4 C), temperature source Oral, resp. rate 18, weight 189 lb 3.2 oz (85.8 kg), SpO2 96 %. ECOG: 1 General appearance: Alert, awake gentleman without distress. Head: Normocephalic, atraumatic. Sclerae anicteric Neck: Supple without adenopathy. No thyroid masses.  Heart: Regular rate and rhythm. S1, S2. No murmurs or gallops. Lungs: Clear to auscultation. No rhonchi, wheeze, no dullness to percussion.  Abdomen: Soft, nontender. No hepatosplenomegaly. No shifting dullness or ascites. Extremities: No clubbing, cyanosis, or edema.  Neurologic: No deficits noted.   Lab Results: Lab Results  Component Value Date   WBC 29.5 (H) 06/15/2016   HGB 17.2 (H) 06/15/2016   HCT 51.5 (H) 06/15/2016   MCV 68.1 (L) 06/15/2016   PLT 575 (H) 06/15/2016     Chemistry      Component Value Date/Time   NA 139 03/16/2016 0941   K 4.5 03/16/2016 0941   CL 102 02/05/2013 1429  CO2 26 03/16/2016 0941   BUN 20.5 03/16/2016 0941   CREATININE 1.3 03/16/2016 0941      Component Value Date/Time   CALCIUM 9.3 03/16/2016 0941   ALKPHOS 74 03/16/2016 0941   AST 26 03/16/2016 0941   ALT 20 03/16/2016 0941   BILITOT 0.66 03/16/2016 0941      Impression and Plan:  75 year old old with the following issues:  1. 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 His Hgb today is improving and getting closer to the therapeutic  target. I recommended continued phlebotomy to get his hematocrit close to 45. He does not report any other specific symptoms related to polycythemia.   His hemoglobin is 17.1 today which is close to target and he elected to defer phlebotomy at this time. The plan is to recheck his counts in 3 months and perform a phlebotomy at that time in preparation for a long trip to [place].   2. Thrombosis prophylaxis: He is continued to be anticoagulated with Coumadin andlow-dose aspirin.  3. Cytoreduction strategies: I discussed the risks and benefits of using hydroxyurea for cytoreduction purposes. Indication would be increase platelet count and leukocytosis which will increase his risk of thrombosis.   He does not report any constitutional symptoms and we will defer cytoreductive therapy at this time.  4. Follow-up: With 3 months.  [facility], MD 9/7/20179:04 AM

## 2016-06-15 NOTE — Telephone Encounter (Signed)
Gave patient avs report and appointments for November.  °

## 2016-06-29 DIAGNOSIS — Z23 Encounter for immunization: Secondary | ICD-10-CM | POA: Diagnosis not present

## 2016-06-29 DIAGNOSIS — Z7901 Long term (current) use of anticoagulants: Secondary | ICD-10-CM | POA: Diagnosis not present

## 2016-07-03 DIAGNOSIS — H35372 Puckering of macula, left eye: Secondary | ICD-10-CM | POA: Diagnosis not present

## 2016-07-28 DIAGNOSIS — Z7901 Long term (current) use of anticoagulants: Secondary | ICD-10-CM | POA: Diagnosis not present

## 2016-08-03 DIAGNOSIS — Z7901 Long term (current) use of anticoagulants: Secondary | ICD-10-CM | POA: Diagnosis not present

## 2016-08-03 LAB — PROTIME-INR

## 2016-08-04 DIAGNOSIS — R1030 Lower abdominal pain, unspecified: Secondary | ICD-10-CM | POA: Diagnosis not present

## 2016-08-16 ENCOUNTER — Ambulatory Visit (INDEPENDENT_AMBULATORY_CARE_PROVIDER_SITE_OTHER): Payer: Medicare Other | Admitting: Cardiovascular Disease

## 2016-08-16 ENCOUNTER — Encounter (INDEPENDENT_AMBULATORY_CARE_PROVIDER_SITE_OTHER): Payer: Self-pay

## 2016-08-16 ENCOUNTER — Encounter: Payer: Self-pay | Admitting: Cardiovascular Disease

## 2016-08-16 VITALS — BP 112/76 | HR 48 | Ht 72.0 in | Wt 190.4 lb

## 2016-08-16 DIAGNOSIS — I868 Varicose veins of other specified sites: Secondary | ICD-10-CM

## 2016-08-16 DIAGNOSIS — I10 Essential (primary) hypertension: Secondary | ICD-10-CM

## 2016-08-16 DIAGNOSIS — I2109 ST elevation (STEMI) myocardial infarction involving other coronary artery of anterior wall: Secondary | ICD-10-CM | POA: Diagnosis not present

## 2016-08-16 DIAGNOSIS — E7800 Pure hypercholesterolemia, unspecified: Secondary | ICD-10-CM | POA: Diagnosis not present

## 2016-08-16 NOTE — Patient Instructions (Signed)
Medication Instructions: Your physician recommends that you continue on your current medications as directed. Please refer to the Current Medication list given to you today.   Labwork: We will request your recent labs from Dr. Delfina Redwood.   Follow-Up: Your physician wants you to follow-up in: 1 year with Dr. Gwenlyn Found. You will receive a reminder letter in the mail two months in advance. If you don't receive a letter, please call our office to schedule the follow-up appointment.   If you need a refill on your cardiac medications before your next appointment, please call your pharmacy.

## 2016-08-16 NOTE — Assessment & Plan Note (Signed)
History of hyperlipidemia on statin therapy followed by his PCP 

## 2016-08-16 NOTE — Assessment & Plan Note (Signed)
History of CAD status post anterior  STEMI 12/02/11 with a occluded LAD which shows unable to recanalize probably because it was a chronic total occlusion. He did have an anterior wall motion amount of EF of 30% which ultimately improved to 50% by 2-D echo in June 2014. He remains asymptomatic on appropriate medications.

## 2016-08-16 NOTE — Assessment & Plan Note (Signed)
History of hypertension blood pressure measured at 112/76. He is on carvedilol. Continue current meds at current dosing

## 2016-08-16 NOTE — Assessment & Plan Note (Signed)
History of remote pulmonary embolism in 2011 after prostate surgery currently on long-term Coumadin anticoagulation.

## 2016-08-16 NOTE — Progress Notes (Signed)
08/16/2016 [place]   07/16/1941  [id number]  Primary Physician Kandice Hams, MD Primary Cardiologist: Lorretta Harp MD Renae Gloss  HPI:  The patient is a 75 year old, thin and fit-appearing, married Caucasian male, father of 78, grandfather to 5 grandchildren who I last saw in the office 08/10/15. He has a history of an anterior ST-segment-elevation myocardial infarction December 02, 2011. He was on Coumadin anticoagulation because of prior pulmonary embolus. I cath'd him radially revealing a left dominant system with an occluded mid LAD. I tried to percutaneously recanalize him. However, this was unsuccessful probably because this was a "CTO." He did have anterior wall motion abnormalities and EF of 30% and anteroapical dyskinesia which ultimately improved over time to an EF of 50% by 2D echo in June of 2014. He is completely asymptomatic. He participated in cardiac rehab. His lipid profile was excellent 08/10/15 with a total cholesterol of 133, LDL 85 and HDL of 31. Since I saw him one year ago he denies chest pain or shortness of breath. He has a daughter who lives in [place] who he visits several times a year.   Current Outpatient Prescriptions  Medication Sig Dispense Refill  . acetaminophen (TYLENOL) 325 MG tablet Take 650 mg by mouth every 6 (six) hours as needed for pain.    Marland Kitchen aspirin 81 MG tablet Take 81 mg by mouth daily.    . carvedilol (COREG) 6.25 MG tablet TAKE 1 TABLET 2 TIMES A DAY. 180 tablet 3  . isosorbide mononitrate (IMDUR) 30 MG 24 hr tablet TAKE 1 TABLET BY MOUTH ONCE DAILY 90 tablet 2  . levothyroxine (SYNTHROID, LEVOTHROID) 100 MCG tablet Take 100 mcg by mouth daily.  3  . NITROSTAT 0.4 MG SL tablet PLACE 1 TABELT UNDER THE TONGUE EVERY 5 MINUTES AS NEEDED FOR CHEST PAIN. 25 tablet 3  . simvastatin (ZOCOR) 20 MG tablet Take 20 mg by mouth every evening. Managed by PCP, Dr. Delfina Redwood    . warfarin (COUMADIN) 5 MG tablet Take 5 mg by mouth daily.  Managed by PCP, Dr. Delfina Redwood     No current facility-administered medications for this visit.     Allergies  Allergen Reactions  . Erythromycin Nausea Only    Social History   Social History  . Marital status: Married    Spouse name: N/A  . Number of children: N/A  . Years of education: N/A   Occupational History  . Not on file.   Social History Main Topics  . Smoking status: Former Smoker    Quit date: 12/01/1968  . Smokeless tobacco: Never Used  . Alcohol use No     Comment: hx of 5-6 years ago an occasional beer or wine  . Drug use: No  . Sexual activity: Not on file   Other Topics Concern  . Not on file   Social History Narrative  . No narrative on file     Review of Systems: General: negative for chills, fever, night sweats or weight changes.  Cardiovascular: negative for chest pain, dyspnea on exertion, edema, orthopnea, palpitations, paroxysmal nocturnal dyspnea or shortness of breath Dermatological: negative for rash Respiratory: negative for cough or wheezing Urologic: negative for hematuria Abdominal: negative for nausea, vomiting, diarrhea, bright red blood per rectum, melena, or hematemesis Neurologic: negative for visual changes, syncope, or dizziness All other systems reviewed and are otherwise negative except as noted above.    Blood pressure 112/76, pulse (!) 48, height 6' (1.829  m), weight 190 lb 6.4 oz (86.4 kg).  General appearance: alert and no distress Neck: no adenopathy, no carotid bruit, no JVD, supple, symmetrical, trachea midline and thyroid not enlarged, symmetric, no tenderness/mass/nodules Lungs: clear to auscultation bilaterally Heart: regular rate and rhythm, S1, S2 normal, no murmur, click, rub or gallop Extremities: extremities normal, atraumatic, no cyanosis or edema  EKG sinus bradycardia 48 evidence of septal Q waves consistent with an old septal infarct. I personally reviewed this EKG  ASSESSMENT AND PLAN:   Pulmonary  embolism, 9/11 after prostate surgery History of remote pulmonary embolism in 2011 after prostate surgery currently on long-term Coumadin anticoagulation.  STEMI (ST elevation myocardial infarction) History of CAD status post anterior  STEMI 12/02/11 with a occluded LAD which shows unable to recanalize probably because it was a chronic total occlusion. He did have an anterior wall motion amount of EF of 30% which ultimately improved to 50% by 2-D echo in June 2014. He remains asymptomatic on appropriate medications.  Essential hypertension History of hypertension blood pressure measured at 112/76. He is on carvedilol. Continue current meds at current dosing  Hyperlipidemia History of hyperlipidemia on statin therapy followed by his PCP      Lorretta Harp MD [facility], [facility] 08/16/2016 10:04 AM

## 2016-08-24 ENCOUNTER — Encounter: Payer: Self-pay | Admitting: Cardiovascular Disease

## 2016-09-05 DIAGNOSIS — Z7901 Long term (current) use of anticoagulants: Secondary | ICD-10-CM | POA: Diagnosis not present

## 2016-09-05 DIAGNOSIS — D485 Neoplasm of uncertain behavior of skin: Secondary | ICD-10-CM | POA: Diagnosis not present

## 2016-09-05 DIAGNOSIS — L821 Other seborrheic keratosis: Secondary | ICD-10-CM | POA: Diagnosis not present

## 2016-09-05 DIAGNOSIS — L720 Epidermal cyst: Secondary | ICD-10-CM | POA: Diagnosis not present

## 2016-09-05 DIAGNOSIS — Z85828 Personal history of other malignant neoplasm of skin: Secondary | ICD-10-CM | POA: Diagnosis not present

## 2016-09-05 DIAGNOSIS — L570 Actinic keratosis: Secondary | ICD-10-CM | POA: Diagnosis not present

## 2016-09-05 DIAGNOSIS — C4359 Malignant melanoma of other part of trunk: Secondary | ICD-10-CM | POA: Diagnosis not present

## 2016-09-05 DIAGNOSIS — L812 Freckles: Secondary | ICD-10-CM | POA: Diagnosis not present

## 2016-09-06 ENCOUNTER — Other Ambulatory Visit: Payer: Self-pay | Admitting: Cardiovascular Disease

## 2016-09-07 ENCOUNTER — Ambulatory Visit (HOSPITAL_BASED_OUTPATIENT_CLINIC_OR_DEPARTMENT_OTHER): Payer: Medicare Other | Admitting: Oncology

## 2016-09-07 ENCOUNTER — Other Ambulatory Visit (HOSPITAL_BASED_OUTPATIENT_CLINIC_OR_DEPARTMENT_OTHER): Payer: Medicare Other

## 2016-09-07 ENCOUNTER — Telehealth: Payer: Self-pay | Admitting: *Deleted

## 2016-09-07 ENCOUNTER — Ambulatory Visit (HOSPITAL_BASED_OUTPATIENT_CLINIC_OR_DEPARTMENT_OTHER): Payer: Medicare Other

## 2016-09-07 ENCOUNTER — Telehealth: Payer: Self-pay | Admitting: Oncology

## 2016-09-07 VITALS — BP 103/69 | HR 52 | Temp 98.1°F | Resp 18 | Ht 72.0 in | Wt 190.6 lb

## 2016-09-07 VITALS — BP 103/66 | HR 53

## 2016-09-07 DIAGNOSIS — D45 Polycythemia vera: Secondary | ICD-10-CM

## 2016-09-07 DIAGNOSIS — Z8546 Personal history of malignant neoplasm of prostate: Secondary | ICD-10-CM | POA: Diagnosis not present

## 2016-09-07 DIAGNOSIS — I2699 Other pulmonary embolism without acute cor pulmonale: Secondary | ICD-10-CM

## 2016-09-07 LAB — COMPREHENSIVE METABOLIC PANEL
ALT: 17 U/L (ref 0–55)
AST: 22 U/L (ref 5–34)
Albumin: 3.7 g/dL (ref 3.5–5.0)
Alkaline Phosphatase: 90 U/L (ref 40–150)
Anion Gap: 7 mEq/L (ref 3–11)
BILIRUBIN TOTAL: 0.77 mg/dL (ref 0.20–1.20)
BUN: 16.7 mg/dL (ref 7.0–26.0)
CHLORIDE: 107 meq/L (ref 98–109)
CO2: 25 meq/L (ref 22–29)
CREATININE: 1.2 mg/dL (ref 0.7–1.3)
Calcium: 9.3 mg/dL (ref 8.4–10.4)
EGFR: 57 mL/min/{1.73_m2} — ABNORMAL LOW (ref >90)
GLUCOSE: 110 mg/dL (ref 70–140)
Potassium: 4.4 mEq/L (ref 3.5–5.1)
SODIUM: 139 meq/L (ref 136–145)
TOTAL PROTEIN: 7 g/dL (ref 6.4–8.3)

## 2016-09-07 LAB — CBC WITH DIFFERENTIAL/PLATELET
BASO%: 1.8 % (ref 0.0–2.0)
Basophils Absolute: 0.4 10*3/uL — ABNORMAL HIGH (ref 0.0–0.1)
EOS ABS: 0.5 10*3/uL (ref 0.0–0.5)
EOS%: 2.1 % (ref 0.0–7.0)
HCT: 52.1 % — ABNORMAL HIGH (ref 38.4–49.9)
HGB: 17.1 g/dL (ref 13.0–17.1)
LYMPH%: 9.6 % — AB (ref 14.0–49.0)
MCH: 23 pg — ABNORMAL LOW (ref 27.2–33.4)
MCHC: 32.8 g/dL (ref 32.0–36.0)
MCV: 70.1 fL — ABNORMAL LOW (ref 79.3–98.0)
MONO#: 2.6 10*3/uL — ABNORMAL HIGH (ref 0.1–0.9)
MONO%: 10.4 % (ref 0.0–14.0)
NEUT%: 76.1 % — ABNORMAL HIGH (ref 39.0–75.0)
NEUTROS ABS: 18.6 10*3/uL — AB (ref 1.5–6.5)
NRBC: 0 % (ref 0–0)
Platelets: 720 10*3/uL — ABNORMAL HIGH (ref 140–400)
RBC: 7.43 10*6/uL — AB (ref 4.20–5.82)
RDW: 19.4 % — AB (ref 11.0–14.6)
WBC: 24.5 10*3/uL — AB (ref 4.0–10.3)
lymph#: 2.4 10*3/uL (ref 0.9–3.3)

## 2016-09-07 LAB — TECHNOLOGIST REVIEW

## 2016-09-07 NOTE — Patient Instructions (Signed)
Therapeutic Phlebotomy Therapeutic phlebotomy is the controlled removal of blood from a person's body for the purpose of treating a medical condition. The procedure is similar to donating blood. Usually, about a pint (470 mL, or 0.47L) of blood is removed. The average adult has 9-12 pints (4.3-5.7 L) of blood. Therapeutic phlebotomy may be used to treat the following medical conditions:  Hemochromatosis. This is a condition in which the blood contains too much iron.  Polycythemia vera. This is a condition in which the blood contains too many red blood cells.  Porphyria cutanea tarda. This is a disease in which an important part of hemoglobin is not made properly. It results in the buildup of abnormal amounts of porphyrins in the body.  Sickle cell disease. This is a condition in which the red blood cells form an abnormal crescent shape rather than a round shape. Tell a health care provider about:  Any allergies you have.  All medicines you are taking, including vitamins, herbs, eye drops, creams, and over-the-counter medicines.  Any problems you or family members have had with anesthetic medicines.  Any blood disorders you have.  Any surgeries you have had.  Any medical conditions you have. What are the risks? Generally, this is a safe procedure. However, problems may occur, including:  Nausea or light-headedness.  Low blood pressure.  Soreness, bleeding, swelling, or bruising at the needle insertion site.  Infection. What happens before the procedure?  Follow instructions from your health care provider about eating or drinking restrictions.  Ask your health care provider about changing or stopping your regular medicines. This is especially important if you are taking diabetes medicines or blood thinners.  Wear clothing with sleeves that can be raised above the elbow.  Plan to have someone take you home after the procedure.  You may have a blood sample taken. What happens  during the procedure?  A needle will be inserted into one of your veins.  Tubing and a collection bag will be attached to that needle.  Blood will flow through the needle and tubing into the collection bag.  You may be asked to open and close your hand slowly and continually during the entire collection.  After the specified amount of blood has been removed from your body, the collection bag and tubing will be clamped.  The needle will be removed from your vein.  Pressure will be held on the site of the needle insertion to stop the bleeding.  A bandage (dressing) will be placed over the needle insertion site. The procedure may vary among health care providers and hospitals. What happens after the procedure?  Your recovery will be assessed and monitored.  You can return to your normal activities as directed by your health care provider. This information is not intended to replace advice given to you by your health care provider. Make sure you discuss any questions you have with your health care provider. Document Released: 02/27/2011 Document Revised: 05/27/2016 Document Reviewed: 09/21/2014 Elsevier Interactive Patient Education  2017 [employer]  

## 2016-09-07 NOTE — Progress Notes (Signed)
Phlebotomy started at [phone] with 16g phlebotomy kit used to the left AC. 531 grams removed and phlebotomy completed at 0955. Snack provided and patient monitored for 30 minutes. Patient and vital signs stable upon discharge.

## 2016-09-07 NOTE — Telephone Encounter (Signed)
Message sent to infusion scheduler to add Phlebotomy, per 09/07/16. Appointments scheduled per 09/07/16 los. A copy of the AVS report and appointment schedule was given to patient, per 09/07/16 los.

## 2016-09-07 NOTE — Progress Notes (Signed)
Hematology and Oncology Follow Up Visit  CILAS LEROY [id number] 1941-04-19 75 y.o. 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 He has JAK 2 positive mutation  Secondary diagnosis: History of prostate cancer diagnosed in 2011. He is also history of coronary arteries disease had a pulmonary embolus.  Current therapy:  Phlebotomy to keep his Hgb less than 17 and hematocrit less than 45.  Interim History: Mr. Thorsen presents today for a followup visit. Since his last visit, he reports feeling well without any recent complaints. He continues to be very active and does not report any decline in his energy her performance status. He denied any joint pain or swelling. He denied any constitutional symptoms of early satiety, weight loss. He denied any headaches or migraines. He denied any thrombosis or bleeding episodes. He continues to be on warfarin and aspirin. He denied any hematochezia, melena or epistaxis. He is planning a trip to [place] in the near future.  He has not reported any fevers chills or sweats. He does not report any fevers, chills, sweats or changes in his appetite. He does not report any orthopnea, leg edema or dyspnea on exertion. Has not reported any nausea or vomiting. Has not reported any syncope or alteration of mental status. As that report any frequency urgency or hesitancy. He does not report any skeletal complaints of arthralgias or myalgias. Rest of his review of systems unremarkable.  Medications: I have reviewed the patient's current medications. Current Outpatient Prescriptions  Medication Sig Dispense Refill  . acetaminophen (TYLENOL) 325 MG tablet Take 650 mg by mouth every 6 (six) hours as needed for pain.    Marland Kitchen aspirin 81 MG tablet Take 81 mg by mouth daily.    . carvedilol (COREG) 6.25 MG tablet TAKE 1 TABLET BY MOUTH TWICE DAILY 180 tablet 3  . isosorbide  mononitrate (IMDUR) 30 MG 24 hr tablet TAKE 1 TABLET BY MOUTH ONCE DAILY 90 tablet 3  . levothyroxine (SYNTHROID, LEVOTHROID) 100 MCG tablet Take 100 mcg by mouth daily.  3  . NITROSTAT 0.4 MG SL tablet PLACE 1 TABELT UNDER THE TONGUE EVERY 5 MINUTES AS NEEDED FOR CHEST PAIN. 25 tablet 3  . simvastatin (ZOCOR) 20 MG tablet Take 20 mg by mouth every evening. Managed by PCP, Dr. Delfina Redwood    . warfarin (COUMADIN) 5 MG tablet Take 5 mg by mouth daily. Managed by PCP, Dr. Delfina Redwood     No current facility-administered medications for this visit.     Allergies:  Allergies  Allergen Reactions  . Erythromycin Nausea Only    Past Medical History, Surgical history, Social history, and Family History were reviewed and updated.   Physical Exam:  Blood pressure 103/69, pulse (!) 52, temperature 98.1 F (36.7 C), temperature source Oral, resp. rate 18, height 6' (1.829 m), weight 190 lb 9.6 oz (86.5 kg), SpO2 99 %. ECOG: 1 General appearance: Well-appearing gentleman without distress. Head: Normocephalic, atraumatic. No oral ulcers or lesions. Neck: Supple without adenopathy. No thyroid masses.  Heart: Regular rate and rhythm. S1, S2. No murmurs or gallops. Lungs: Clear to auscultation. No rhonchi, wheeze, no dullness to percussion.  Abdomen: Soft, nontender. No hepatosplenomegaly. No rebound or guarding. Extremities: No clubbing, cyanosis, or edema. No joint swelling or tenderness. Neurologic: No deficits noted.   Lab Results: Lab Results  Component Value Date   WBC 24.5 (H) 09/07/2016   HGB 17.1 09/07/2016  HCT 52.1 (H) 09/07/2016   MCV 70.1 (L) 09/07/2016   PLT 720 (H) 09/07/2016     Chemistry      Component Value Date/Time   NA 139 09/07/2016 0837   K 4.4 09/07/2016 0837   CL 102 02/05/2013 1429   CO2 25 09/07/2016 0837   BUN 16.7 09/07/2016 0837   CREATININE 1.2 09/07/2016 0837      Component Value Date/Time   CALCIUM 9.3 09/07/2016 0837   ALKPHOS 90 09/07/2016 0837   AST 22  09/07/2016 0837   ALT 17 09/07/2016 0837   BILITOT 0.77 09/07/2016 0837      Impression and Plan:  75 year old man   1. 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 His Hgb today is improving and getting closer to the therapeutic target. I recommended continued phlebotomy to get his hematocrit close to 45. He does not report any other specific symptoms related to polycythemia.   His hemoglobin is 17.1 with hematocrit of 52. The plan is to proceed with a phlebotomy today and repeated in 2-3 months.   2. Thrombosis prophylaxis: He is continued to be anticoagulated with Coumadin andlow-dose aspirin.  3. Cytoreduction strategies: I discussed the risks and benefits of using hydroxyurea for cytoreduction purposes. Indication would be increase platelet count and leukocytosis which will increase his risk of thrombosis. We will defer this option for the time being and consider starting hydroxyurea if his platelet count over 800.  4. Follow-up: With 2 months.  [id number], MD 11/30/20179:18 AM

## 2016-09-07 NOTE — Telephone Encounter (Signed)
Per LOS I have scheduled appts and notified the scheduler 

## 2016-10-30 DIAGNOSIS — C4359 Malignant melanoma of other part of trunk: Secondary | ICD-10-CM | POA: Diagnosis not present

## 2016-10-30 DIAGNOSIS — Z85828 Personal history of other malignant neoplasm of skin: Secondary | ICD-10-CM | POA: Diagnosis not present

## 2016-10-30 DIAGNOSIS — D0359 Melanoma in situ of other part of trunk: Secondary | ICD-10-CM | POA: Diagnosis not present

## 2016-10-30 DIAGNOSIS — Z85820 Personal history of malignant melanoma of skin: Secondary | ICD-10-CM | POA: Diagnosis not present

## 2016-11-06 DIAGNOSIS — R42 Dizziness and giddiness: Secondary | ICD-10-CM | POA: Diagnosis not present

## 2016-11-06 DIAGNOSIS — Z7901 Long term (current) use of anticoagulants: Secondary | ICD-10-CM | POA: Diagnosis not present

## 2016-11-06 DIAGNOSIS — I10 Essential (primary) hypertension: Secondary | ICD-10-CM | POA: Diagnosis not present

## 2016-11-08 ENCOUNTER — Other Ambulatory Visit: Payer: Self-pay | Admitting: Nurse Practitioner

## 2016-11-08 ENCOUNTER — Ambulatory Visit
Admission: RE | Admit: 2016-11-08 | Discharge: 2016-11-08 | Disposition: A | Discharge status: Home / Self Care | Payer: Medicare Other | Source: Ambulatory Visit | Attending: Nurse Practitioner | Admitting: Nurse Practitioner

## 2016-11-08 DIAGNOSIS — M79604 Pain in right leg: Secondary | ICD-10-CM

## 2016-11-08 DIAGNOSIS — M79661 Pain in right lower leg: Secondary | ICD-10-CM | POA: Diagnosis not present

## 2016-11-14 ENCOUNTER — Telehealth: Payer: Self-pay | Admitting: *Deleted

## 2016-11-14 ENCOUNTER — Encounter (INDEPENDENT_AMBULATORY_CARE_PROVIDER_SITE_OTHER): Payer: Self-pay

## 2016-11-14 ENCOUNTER — Other Ambulatory Visit (HOSPITAL_BASED_OUTPATIENT_CLINIC_OR_DEPARTMENT_OTHER): Payer: Medicare Other

## 2016-11-14 ENCOUNTER — Ambulatory Visit (HOSPITAL_BASED_OUTPATIENT_CLINIC_OR_DEPARTMENT_OTHER): Payer: Medicare Other | Admitting: Oncology

## 2016-11-14 ENCOUNTER — Telehealth: Payer: Self-pay | Admitting: Oncology

## 2016-11-14 VITALS — BP 114/59 | HR 55 | Temp 97.7°F | Resp 18 | Ht 72.0 in | Wt 186.7 lb

## 2016-11-14 DIAGNOSIS — D45 Polycythemia vera: Secondary | ICD-10-CM

## 2016-11-14 DIAGNOSIS — D471 Chronic myeloproliferative disease: Secondary | ICD-10-CM

## 2016-11-14 DIAGNOSIS — D72819 Decreased white blood cell count, unspecified: Secondary | ICD-10-CM

## 2016-11-14 DIAGNOSIS — Z7901 Long term (current) use of anticoagulants: Secondary | ICD-10-CM | POA: Diagnosis not present

## 2016-11-14 DIAGNOSIS — Z8546 Personal history of malignant neoplasm of prostate: Secondary | ICD-10-CM

## 2016-11-14 DIAGNOSIS — D473 Essential (hemorrhagic) thrombocythemia: Secondary | ICD-10-CM | POA: Diagnosis not present

## 2016-11-14 LAB — COMPREHENSIVE METABOLIC PANEL
ALBUMIN: 4 g/dL (ref 3.5–5.0)
ALK PHOS: 89 U/L (ref 40–150)
ALT: 19 U/L (ref 0–55)
AST: 25 U/L (ref 5–34)
Anion Gap: 7 mEq/L (ref 3–11)
BILIRUBIN TOTAL: 0.62 mg/dL (ref 0.20–1.20)
BUN: 22 mg/dL (ref 7.0–26.0)
CO2: 26 mEq/L (ref 22–29)
CREATININE: 1.3 mg/dL (ref 0.7–1.3)
Calcium: 9.4 mg/dL (ref 8.4–10.4)
Chloride: 105 mEq/L (ref 98–109)
EGFR: 54 mL/min/{1.73_m2} — AB (ref >90)
GLUCOSE: 85 mg/dL (ref 70–140)
Potassium: 4.6 mEq/L (ref 3.5–5.1)
SODIUM: 138 meq/L (ref 136–145)
TOTAL PROTEIN: 7 g/dL (ref 6.4–8.3)

## 2016-11-14 LAB — CBC WITH DIFFERENTIAL/PLATELET
BASO%: 0.3 % (ref 0.0–2.0)
Basophils Absolute: 0.1 10*3/uL (ref 0.0–0.1)
EOS ABS: 0.6 10*3/uL — AB (ref 0.0–0.5)
EOS%: 1.9 % (ref 0.0–7.0)
HCT: 52 % — ABNORMAL HIGH (ref 38.4–49.9)
HEMOGLOBIN: 16.5 g/dL (ref 13.0–17.1)
LYMPH%: 7.1 % — ABNORMAL LOW (ref 14.0–49.0)
MCH: 22.6 pg — ABNORMAL LOW (ref 27.2–33.4)
MCHC: 31.7 g/dL — ABNORMAL LOW (ref 32.0–36.0)
MCV: 71.3 fL — AB (ref 79.3–98.0)
MONO#: 2.7 10*3/uL — AB (ref 0.1–0.9)
MONO%: 9.2 % (ref 0.0–14.0)
NEUT%: 81.5 % — ABNORMAL HIGH (ref 39.0–75.0)
NEUTROS ABS: 24.3 10*3/uL — AB (ref 1.5–6.5)
PLATELETS: 774 10*3/uL — AB (ref 140–400)
RBC: 7.3 10*6/uL — ABNORMAL HIGH (ref 4.20–5.82)
RDW: 18 % — AB (ref 11.0–14.6)
WBC: 29.9 10*3/uL — AB (ref 4.0–10.3)
lymph#: 2.1 10*3/uL (ref 0.9–3.3)

## 2016-11-14 LAB — TECHNOLOGIST REVIEW

## 2016-11-14 NOTE — Telephone Encounter (Signed)
Appointments scheduled per 2/6 LOS. Patient given AVS report and calendars with future scheduled appointments. °

## 2016-11-14 NOTE — Telephone Encounter (Signed)
Per 2/6 LOS and staff message I have scheduled appt and notified the scheduler

## 2016-11-14 NOTE — Progress Notes (Signed)
Hematology and Oncology Follow Up Visit  Gregory Blankenship [id number] 03-21-1941 76 y.o. 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 He has JAK 2 positive mutation  Secondary diagnosis: History of prostate cancer diagnosed in 2011. He is also history of coronary arteries disease had a pulmonary embolus.  Current therapy:  Phlebotomy to keep his Hgb less than 17 and hematocrit less than 45.  Interim History: Gregory Blankenship presents today for a followup visit. Since his last visit, he reports no new major changes in his health. He traveled recently to [place] which she does intermittently. He denied any recent issues related to that including lower extremity edema or swelling. He did have some pain around his knee and was evaluated for possible deep vein thrombosis which was ruled out. He denied any recent thrombosis or bleeding episodes. He continues to be on full dose anticoagulation.. Remains active and attends activities of daily living.  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 It appears to be an early-stage and did not require any further intervention.  He has not reported any fevers chills or sweats. He does not report any fevers, chills, sweats or changes in his appetite. He does not report any orthopnea, leg edema or dyspnea on exertion. Has not reported any nausea or vomiting. Has not reported any syncope or alteration of mental status. As that report any frequency urgency or hesitancy. He does not report any skeletal complaints of arthralgias or myalgias. Rest of his review of systems unremarkable.  Medications: I have reviewed the patient's current medications. Current Outpatient Prescriptions  Medication Sig Dispense Refill  . acetaminophen (TYLENOL) 325 MG tablet Take 650 mg by mouth every 6 (six) hours as needed for pain.    Marland Kitchen  aspirin 81 MG tablet Take 81 mg by mouth daily.    . carvedilol (COREG) 6.25 MG tablet TAKE 1 TABLET BY MOUTH TWICE DAILY 180 tablet 3  . isosorbide mononitrate (IMDUR) 30 MG 24 hr tablet TAKE 1 TABLET BY MOUTH ONCE DAILY 90 tablet 3  . levothyroxine (SYNTHROID, LEVOTHROID) 100 MCG tablet Take 100 mcg by mouth daily.  3  . mupirocin ointment (BACTROBAN) 2 % APP AA ON THE SKIN D  0  . NITROSTAT 0.4 MG SL tablet PLACE 1 TABELT UNDER THE TONGUE EVERY 5 MINUTES AS NEEDED FOR CHEST PAIN. 25 tablet 3  . simvastatin (ZOCOR) 20 MG tablet Take 20 mg by mouth every evening. Managed by PCP, Dr. Delfina Blankenship    . warfarin (COUMADIN) 5 MG tablet Take 5 mg by mouth daily. Managed by PCP, Dr. Delfina Blankenship     No current facility-administered medications for this visit.     Allergies:  Allergies  Allergen Reactions  . Erythromycin Nausea Only    Past Medical History, Surgical history, Social history, and Family History were reviewed and updated.   Physical Exam:  Blood pressure (!) 114/59, pulse (!) 55, temperature 97.7 F (36.5 C), temperature source Oral, resp. rate 18, height 6' (1.829 m), weight 186 lb 11.2 oz (84.7 kg), SpO2 98 %. ECOG: 1 General appearance: Alert, awake gentleman without distress. Head: Normocephalic, atraumatic. No oral thrush. Sclerae are anicteric. Neck: Supple without adenopathy. No thyroid masses.  Heart: Regular rate and rhythm. S1, S2. No murmurs or gallops. Lungs: Clear to auscultation. No rhonchi, wheeze, no dullness to percussion.  Abdomen: Soft,  nontender. No hepatosplenomegaly. No organomegaly noted. Extremities: No clubbing, cyanosis, or edema. No joint swelling or tenderness. Neurologic: No deficits noted.   Lab Results: Lab Results  Component Value Date   WBC 29.9 (H) 11/14/2016   HGB 16.5 11/14/2016   HCT 52.0 (H) 11/14/2016   MCV 71.3 (L) 11/14/2016   PLT 774 (H) 11/14/2016     Chemistry      Component Value Date/Time   NA 138 11/14/2016 0907   K 4.6  11/14/2016 0907   CL 102 02/05/2013 1429   CO2 26 11/14/2016 0907   BUN 22.0 11/14/2016 0907   CREATININE 1.3 11/14/2016 0907      Component Value Date/Time   CALCIUM 9.4 11/14/2016 0907   ALKPHOS 89 11/14/2016 0907   AST 25 11/14/2016 0907   ALT 19 11/14/2016 0907   BILITOT 0.62 11/14/2016 0907      Impression and Plan:  76 year old man   1. 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 He does have a JAK2 mutation also has leukocytosis and thrombocytosis.  His hemoglobin is 16.5 and will not require phlebotomy at this time. He will have periodic CBC checked and repeat phlebotomy in his hemoglobin above 17.  The natural course of this disease was reviewed again today including potential delayed complications. These complications would include constitutional symptoms as well as transformation into myelofibrosis or leukemia. The rate of transformation is low at this time but we will continue to monitor him.   2. Thrombosis prophylaxis: He is continued to be anticoagulated with Coumadin andlow-dose aspirin.  3. Cytoreduction strategies: I discussed the risks and benefits of using hydroxyurea for cytoreduction purposes. Indication would be increase platelet count and leukocytosis which will increase his risk of thrombosis. We will defer this option for the time being and consider starting hydroxyurea if his platelet count over 800.  4. Follow-up: With 3 months.  [facility], MD 2/6/20189:49 AM

## 2016-11-28 DIAGNOSIS — Z7901 Long term (current) use of anticoagulants: Secondary | ICD-10-CM | POA: Diagnosis not present

## 2016-12-27 DIAGNOSIS — Z7901 Long term (current) use of anticoagulants: Secondary | ICD-10-CM | POA: Diagnosis not present

## 2017-01-25 DIAGNOSIS — K469 Unspecified abdominal hernia without obstruction or gangrene: Secondary | ICD-10-CM | POA: Diagnosis not present

## 2017-01-25 DIAGNOSIS — M109 Gout, unspecified: Secondary | ICD-10-CM | POA: Diagnosis not present

## 2017-01-25 DIAGNOSIS — I8390 Asymptomatic varicose veins of unspecified lower extremity: Secondary | ICD-10-CM | POA: Diagnosis not present

## 2017-01-25 DIAGNOSIS — Z7901 Long term (current) use of anticoagulants: Secondary | ICD-10-CM | POA: Diagnosis not present

## 2017-02-06 ENCOUNTER — Ambulatory Visit: Payer: Medicare Other

## 2017-02-06 ENCOUNTER — Other Ambulatory Visit (HOSPITAL_BASED_OUTPATIENT_CLINIC_OR_DEPARTMENT_OTHER): Payer: Medicare Other

## 2017-02-06 ENCOUNTER — Ambulatory Visit (HOSPITAL_BASED_OUTPATIENT_CLINIC_OR_DEPARTMENT_OTHER): Payer: Medicare Other | Admitting: Oncology

## 2017-02-06 ENCOUNTER — Telehealth: Payer: Self-pay | Admitting: Oncology

## 2017-02-06 VITALS — BP 101/65 | HR 60 | Resp 16

## 2017-02-06 VITALS — BP 98/66 | HR 56 | Temp 97.5°F | Resp 20 | Ht 72.0 in | Wt 181.2 lb

## 2017-02-06 DIAGNOSIS — D45 Polycythemia vera: Secondary | ICD-10-CM

## 2017-02-06 DIAGNOSIS — Z7901 Long term (current) use of anticoagulants: Secondary | ICD-10-CM

## 2017-02-06 DIAGNOSIS — D72828 Other elevated white blood cell count: Secondary | ICD-10-CM

## 2017-02-06 DIAGNOSIS — Z8546 Personal history of malignant neoplasm of prostate: Secondary | ICD-10-CM | POA: Diagnosis not present

## 2017-02-06 DIAGNOSIS — D72829 Elevated white blood cell count, unspecified: Secondary | ICD-10-CM | POA: Diagnosis not present

## 2017-02-06 DIAGNOSIS — D471 Chronic myeloproliferative disease: Secondary | ICD-10-CM

## 2017-02-06 DIAGNOSIS — D473 Essential (hemorrhagic) thrombocythemia: Secondary | ICD-10-CM

## 2017-02-06 LAB — CBC WITH DIFFERENTIAL/PLATELET
BASO%: 1.7 % (ref 0.0–2.0)
BASOS ABS: 0.4 10*3/uL — AB (ref 0.0–0.1)
EOS ABS: 0.5 10*3/uL (ref 0.0–0.5)
EOS%: 2 % (ref 0.0–7.0)
HCT: 53.5 % — ABNORMAL HIGH (ref 38.4–49.9)
HGB: 17.4 g/dL — ABNORMAL HIGH (ref 13.0–17.1)
LYMPH%: 8.5 % — AB (ref 14.0–49.0)
MCH: 22.7 pg — AB (ref 27.2–33.4)
MCHC: 32.5 g/dL (ref 32.0–36.0)
MCV: 69.9 fL — AB (ref 79.3–98.0)
MONO#: 3.9 10*3/uL — ABNORMAL HIGH (ref 0.1–0.9)
MONO%: 15.4 % — AB (ref 0.0–14.0)
NEUT#: 18.2 10*3/uL — ABNORMAL HIGH (ref 1.5–6.5)
NEUT%: 72.4 % (ref 39.0–75.0)
NRBC: 1 % — AB (ref 0–0)
PLATELETS: 785 10*3/uL — AB (ref 140–400)
RBC: 7.65 10*6/uL — AB (ref 4.20–5.82)
RDW: 19.8 % — ABNORMAL HIGH (ref 11.0–14.6)
WBC: 25.1 10*3/uL — ABNORMAL HIGH (ref 4.0–10.3)
lymph#: 2.1 10*3/uL (ref 0.9–3.3)

## 2017-02-06 LAB — COMPREHENSIVE METABOLIC PANEL
ALK PHOS: 82 U/L (ref 40–150)
ALT: 30 U/L (ref 0–55)
ANION GAP: 8 meq/L (ref 3–11)
AST: 29 U/L (ref 5–34)
Albumin: 4.1 g/dL (ref 3.5–5.0)
BILIRUBIN TOTAL: 0.79 mg/dL (ref 0.20–1.20)
BUN: 18.8 mg/dL (ref 7.0–26.0)
CO2: 27 meq/L (ref 22–29)
Calcium: 9.5 mg/dL (ref 8.4–10.4)
Chloride: 104 mEq/L (ref 98–109)
Creatinine: 1.4 mg/dL — ABNORMAL HIGH (ref 0.7–1.3)
EGFR: 51 mL/min/{1.73_m2} — AB (ref >90)
Glucose: 55 mg/dl — ABNORMAL LOW (ref 70–140)
POTASSIUM: 4.7 meq/L (ref 3.5–5.1)
Sodium: 139 mEq/L (ref 136–145)
TOTAL PROTEIN: 7.1 g/dL (ref 6.4–8.3)

## 2017-02-06 LAB — TECHNOLOGIST REVIEW

## 2017-02-06 NOTE — Patient Instructions (Signed)
     Therapeutic Phlebotomy, Care After Refer to this sheet in the next few weeks. These instructions provide you with information about caring for yourself after your procedure. Your health care provider may also give you more specific instructions. Your treatment has been planned according to current medical practices, but problems sometimes occur. Call your health care provider if you have any problems or questions after your procedure. What can I expect after the procedure? After the procedure, it is common to have:  Light-headedness or dizziness. You may feel faint.  Nausea.  Tiredness. Follow these instructions at home: Activity  Return to your normal activities as directed by your health care provider. Most people can go back to their normal activities right away.  Avoid strenuous physical activity and heavy lifting or pulling for about 5 hours after the procedure. Do not lift anything that is heavier than 10 lb (4.5 kg).  Athletes should avoid strenuous exercise for at least 12 hours.  Change positions slowly for the remainder of the day. This will help to prevent light-headedness or fainting.  If you feel light-headed, lie down until the feeling goes away. Eating and drinking  Be sure to eat well-balanced meals for the next 24 hours.  Drink enough fluid to keep your urine clear or pale yellow.  Avoid drinking alcohol on the day that you had the procedure. Care of the Needle Insertion Site  Keep your bandage dry. You can remove the bandage after about 5 hours or as directed by your health care provider.  If you have bleeding from the needle insertion site, elevate your arm and press firmly on the site until the bleeding stops.  If you have bruising at the site, apply ice to the area:  Put ice in a plastic bag.  Place a towel between your skin and the bag.  Leave the ice on for 20 minutes, 2-3 times a day for the first 24 hours.  If the swelling does not go away  after 24 hours, apply a warm, moist washcloth to the area for 20 minutes, 2-3 times a day. General instructions  Avoid smoking for at least 30 minutes after the procedure.  Keep all follow-up visits as directed by your health care provider. It is important to continue with further therapeutic phlebotomy treatments as directed. Contact a health care provider if:  You have redness, swelling, or pain at the needle insertion site.  You have fluid, blood, or pus coming from the needle insertion site.  You feel light-headed, dizzy, or nauseated, and the feeling does not go away.  You notice new bruising at the needle insertion site.  You feel weaker than normal.  You have a fever or chills. Get help right away if:  You have severe nausea or vomiting.  You have chest pain.  You have trouble breathing. This information is not intended to replace advice given to you by your health care provider. Make sure you discuss any questions you have with your health care provider. Document Released: 02/27/2011 Document Revised: 05/27/2016 Document Reviewed: 09/21/2014 Elsevier Interactive Patient Education  2017 [employer]  

## 2017-02-06 NOTE — Telephone Encounter (Signed)
Appointments scheduled per 02/06/17. Patient was given a copy of the AVS report and appointment schedule per 02/06/17 los.

## 2017-02-06 NOTE — Progress Notes (Signed)
Gregory Blankenship presents today for phlebotomy per MD orders. Per MD Alen Blew, ok to proceed with current BP (see flowsheets). Phlebotomy procedure started with 16G on left AC starting at 1010 and ended at 1032. 551 grams removed. Patient observed for 30 minutes after procedure without any incident. Drink/snack provided  Patient tolerated procedure well.

## 2017-02-06 NOTE — Progress Notes (Signed)
Hematology and Oncology Follow Up Visit  Gregory Blankenship [id number] [birth year] 76 y.o. 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 He has JAK 2 positive mutation  Secondary diagnosis: History of prostate cancer diagnosed in 2011. He is also history of coronary arteries disease had a pulmonary embolus.  Current therapy:  Phlebotomy to keep his Hgb less than 17 and hematocrit less than 45.  Interim History: Gregory Blankenship presents today for a followup visit. Since his last visit, he reports no recent complaints. He denied any recent thrombosis or bleeding episodes. He denied any abdominal pain, as time cure constitutional symptoms. He remains active and attends activities of daily living. He is planning to have a trip to [place] in the near future. He denied any recent hospitalizations or illnesses. He denied any cardiac complications.  He has not reported any fevers chills or sweats. He does not report any fevers, chills, sweats or changes in his appetite. He does not report any orthopnea, leg edema or dyspnea on exertion. Has not reported any nausea or vomiting. Has not reported any syncope or alteration of mental status. As that report any frequency urgency or hesitancy. He does not report any skeletal complaints of arthralgias or myalgias. Rest of his review of systems unremarkable.  Medications: I have reviewed the patient's current medications. Current Outpatient Prescriptions  Medication Sig Dispense Refill  . acetaminophen (TYLENOL) 325 MG tablet Take 650 mg by mouth every 6 (six) hours as needed for pain.    Marland Kitchen aspirin 81 MG tablet Take 81 mg by mouth daily.    . carvedilol (COREG) 6.25 MG tablet TAKE 1 TABLET BY MOUTH TWICE DAILY 180 tablet 3  . isosorbide mononitrate (IMDUR) 30 MG 24 hr tablet TAKE 1 TABLET BY MOUTH ONCE DAILY 90 tablet 3  . levothyroxine (SYNTHROID, LEVOTHROID) 100 MCG  tablet Take 100 mcg by mouth daily.  3  . mupirocin ointment (BACTROBAN) 2 % APP AA ON THE SKIN D  0  . NITROSTAT 0.4 MG SL tablet PLACE 1 TABELT UNDER THE TONGUE EVERY 5 MINUTES AS NEEDED FOR CHEST PAIN. 25 tablet 3  . simvastatin (ZOCOR) 20 MG tablet Take 20 mg by mouth every evening. Managed by PCP, Dr. Delfina Blankenship    . warfarin (COUMADIN) 5 MG tablet Take 5 mg by mouth daily. Managed by PCP, Dr. Delfina Blankenship     No current facility-administered medications for this visit.     Allergies:  Allergies  Allergen Reactions  . Erythromycin Nausea Only    Past Medical History, Surgical history, Social history, and Family History were reviewed and updated.   Physical Exam:  Blood pressure 98/66, pulse (!) 56, temperature 97.5 F (36.4 C), temperature source Oral, resp. rate 20, height 6' (1.829 m), weight 181 lb 3.2 oz (82.2 kg), SpO2 99 %. ECOG: 1 General appearance: Well-appearing gentleman without distress. Head: Normocephalic, atraumatic. No oral thrush. Sclerae are injected. Neck: Supple without adenopathy. No thyroid masses.  Heart: Regular rate and rhythm. S1, S2. No murmurs or gallops. Lungs: Clear to auscultation. No rhonchi, wheeze, no dullness to percussion.  Abdomen: Soft, nontender. No hepatosplenomegaly. No splenomegaly palpated. Extremities: No clubbing, cyanosis, or edema. No joint swelling or tenderness. Neurologic: No deficits noted.   Lab Results: Lab Results  Component Value Date   WBC 29.9 (H) 11/14/2016   HGB 16.5 11/14/2016   HCT 52.0 (H) 11/14/2016   MCV 71.3 (L) 11/14/2016  PLT 774 (H) 11/14/2016     Chemistry      Component Value Date/Time   NA 138 11/14/2016 0907   K 4.6 11/14/2016 0907   CL 102 02/05/2013 1429   CO2 26 11/14/2016 0907   BUN 22.0 11/14/2016 0907   CREATININE 1.3 11/14/2016 0907      Component Value Date/Time   CALCIUM 9.4 11/14/2016 0907   ALKPHOS 89 11/14/2016 0907   AST 25 11/14/2016 0907   ALT 19 11/14/2016 0907   BILITOT 0.62  11/14/2016 0907      Impression and Plan:  76 year old man   1. 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 He does have a JAK2 mutation also has leukocytosis and thrombocytosis.  He is currently getting periodic phlebotomy to maintain his hematocrit below 45. His hemoglobin is pending today but likely will require phlebotomy today in anticipation of his recent travel to [place].  The natural course of this disease was reviewed again today including potential delayed complications. These complications would include thrombosis, bleeding transformation into leukemia.    2. Thrombosis prophylaxis: He is continued to be anticoagulated with Coumadin andlow-dose aspirin.  3. Cytoreduction strategies: He will likely require hydroxyurea in the future if his blood count exceeded 100.  4. Follow-up: With 3 months.  [facility], MD 5/1/20189:08 AM

## 2017-02-21 DIAGNOSIS — Z7901 Long term (current) use of anticoagulants: Secondary | ICD-10-CM | POA: Diagnosis not present

## 2017-02-21 DIAGNOSIS — I950 Idiopathic hypotension: Secondary | ICD-10-CM | POA: Diagnosis not present

## 2017-02-21 DIAGNOSIS — J069 Acute upper respiratory infection, unspecified: Secondary | ICD-10-CM | POA: Diagnosis not present

## 2017-02-26 DIAGNOSIS — J069 Acute upper respiratory infection, unspecified: Secondary | ICD-10-CM | POA: Diagnosis not present

## 2017-03-12 DIAGNOSIS — D1801 Hemangioma of skin and subcutaneous tissue: Secondary | ICD-10-CM | POA: Diagnosis not present

## 2017-03-12 DIAGNOSIS — Z85828 Personal history of other malignant neoplasm of skin: Secondary | ICD-10-CM | POA: Diagnosis not present

## 2017-03-12 DIAGNOSIS — Z85820 Personal history of malignant melanoma of skin: Secondary | ICD-10-CM | POA: Diagnosis not present

## 2017-03-12 DIAGNOSIS — L821 Other seborrheic keratosis: Secondary | ICD-10-CM | POA: Diagnosis not present

## 2017-03-13 DIAGNOSIS — Z7901 Long term (current) use of anticoagulants: Secondary | ICD-10-CM | POA: Diagnosis not present

## 2017-03-28 DIAGNOSIS — Z0000 Encounter for general adult medical examination without abnormal findings: Secondary | ICD-10-CM | POA: Diagnosis not present

## 2017-03-28 DIAGNOSIS — M109 Gout, unspecified: Secondary | ICD-10-CM | POA: Diagnosis not present

## 2017-03-28 DIAGNOSIS — I10 Essential (primary) hypertension: Secondary | ICD-10-CM | POA: Diagnosis not present

## 2017-03-28 DIAGNOSIS — Z1389 Encounter for screening for other disorder: Secondary | ICD-10-CM | POA: Diagnosis not present

## 2017-04-09 DIAGNOSIS — D45 Polycythemia vera: Secondary | ICD-10-CM | POA: Diagnosis not present

## 2017-04-09 DIAGNOSIS — Z7901 Long term (current) use of anticoagulants: Secondary | ICD-10-CM | POA: Diagnosis not present

## 2017-04-09 DIAGNOSIS — I10 Essential (primary) hypertension: Secondary | ICD-10-CM | POA: Diagnosis not present

## 2017-04-09 DIAGNOSIS — E039 Hypothyroidism, unspecified: Secondary | ICD-10-CM | POA: Diagnosis not present

## 2017-04-17 ENCOUNTER — Ambulatory Visit: Payer: Self-pay | Admitting: General Surgery

## 2017-04-17 DIAGNOSIS — K4090 Unilateral inguinal hernia, without obstruction or gangrene, not specified as recurrent: Secondary | ICD-10-CM | POA: Diagnosis not present

## 2017-04-17 NOTE — H&P (Signed)
History of Present Illness Ralene Ok MD; 04/17/2017 2:18 PM) The patient is a 76 year old male who presents with an inguinal hernia. Referred by: Dr. Jori Moll polite Chief Complaint: Right inguinal hernia  Patient is a 76 year old male with a past medical history significant for prostate cancer, previous MI, previous PE on Coumadin, PCV. Patient states he noticed a hernia approximately 3 weeks ago. He states he noticed a bulge in burning sensation to the right inguinal area, with some sporadic sharp pain. 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 He states that when he is on his feet for long time while working he notices more pain to the right inguinal area, this is the only modifying factor.  Of note the patient is had a previous robotic prostatectomy by Dr. Alinda Money.  Patient sees Dr. Gwenlyn Found for his cardiac follow-up Patient sees Dr. Dema Severin for his Coumadin management Patient sees Dr. Corinda Gubler his PCV.   Duration: Location: Timing: Severity: Modifying factors:   Diagnostic Studies History Malachy Moan, RMA; 04/17/2017 1:46 PM) Colonoscopy  1-5 years ago  Allergies Malachy Moan, RMA; 04/17/2017 1:47 PM) Erythromycin *DERMATOLOGICALS*  Nausea.  Medication History Malachy Moan, [place]; 04/17/2017 1:49 PM) Aspirin (81MG  Tablet, Oral) Active. Carvedilol (6.25MG  Tablet, Oral) Active. Isosorbide Mononitrate ER (30MG  Tablet ER 24HR, Oral) Active. Warfarin Sodium (4MG  Tablet, Oral) Active. Simvastatin (20MG  Tablet, Oral) Active. Acetaminophen (325MG  Tablet, Oral) Active. Nitrostat (0.4MG  Tab Sublingual, Sublingual) Active. Medications Reconciled  Social History Malachy Moan, [place]; 04/17/2017 1:46 PM) Caffeine use  Tea. No alcohol use  No drug use  Tobacco use  Former smoker.  Family History Malachy Moan, [place]; 04/17/2017 1:46 PM) Breast Cancer  Mother. Heart Disease  Brother, Father. Respiratory  Condition  Father.  Other Problems Malachy Moan, RMA; 04/17/2017 1:46 PM) Congestive Heart Failure  Melanoma  Myocardial infarction  Prostate Cancer  Pulmonary Embolism / Blood Clot in Legs  Thyroid Disease  Vascular Disease     Review of Systems Ralene Ok MD; 04/17/2017 2:15 PM) General Not Present- Appetite Loss, Chills, Fatigue, Fever, Night Sweats, Weight Gain and Weight Loss. Skin Not Present- Change in Wart/Mole, Dryness, Hives, Jaundice, New Lesions, Non-Healing Wounds, Rash and Ulcer. HEENT Present- Wears glasses/contact lenses. Not Present- Earache, Hearing Loss, Hoarseness, Nose Bleed, Oral Ulcers, Ringing in the Ears, Seasonal Allergies, Sinus Pain, Sore Throat, Visual Disturbances and Yellow Eyes. Respiratory Not Present- Bloody sputum, Chronic Cough, Difficulty Breathing, Snoring and Wheezing. Breast Not Present- Breast Mass, Breast Pain, Nipple Discharge and Skin Changes. Cardiovascular Not Present- Chest Pain, Difficulty Breathing Lying Down, Leg Cramps, Palpitations, Rapid Heart Rate, Shortness of Breath and Swelling of Extremities. Gastrointestinal Not Present- Abdominal Pain, Bloating, Bloody Stool, Change in Bowel Habits, Chronic diarrhea, Constipation, Difficulty Swallowing, Excessive gas, Gets full quickly at meals, Hemorrhoids, Indigestion, Nausea, Rectal Pain and Vomiting. Male Genitourinary Not Present- Blood in Urine, Change in Urinary Stream, Frequency, Impotence, Nocturia, Painful Urination, Urgency and Urine Leakage. Musculoskeletal Not Present- Back Pain, Joint Pain, Joint Stiffness, Muscle Pain, Muscle Weakness and Swelling of Extremities. Neurological Not Present- Decreased Memory, Fainting, Headaches, Numbness, Seizures, Tingling, Tremor, Trouble walking and Weakness. Psychiatric Not Present- Anxiety, Bipolar, Change in Sleep Pattern, Depression, Fearful and Frequent crying. Endocrine Not Present- Cold Intolerance, Excessive Hunger, Hair  Changes, Heat Intolerance, Hot flashes and New Diabetes. Hematology Not Present- Blood Thinners, Easy Bruising, Excessive bleeding, Gland problems, HIV and Persistent Infections. All other systems negative  Vitals Malachy Moan RMA; 04/17/2017 1:49 PM) 04/17/2017  1:49 PM Weight: 178.8 lb Height: 71in Body Surface Area: 2.01 m Body Mass Index: 24.94 kg/m  Temp.: 97.17F  Pulse: 53 (Regular)  BP: 118/80 (Sitting, Left Arm, Standard)       Physical Exam Ralene Ok MD; 04/17/2017 2:18 PM) The physical exam findings are as follows: Note:Constitutional: No acute distress, conversant, appears stated age  Eyes: Anicteric sclerae, moist conjunctiva, no lid lag  Neck: No thyromegaly, trachea midline, no cervical lymphadenopathy  Lungs: Clear to auscultation biilaterally, normal respiratory effot  Cardiovascular: regular rate & rhythm, no murmurs, no peripheal edema, pedal pulses 2+  GI: Soft, no masses or hepatosplenomegaly, non-tender to palpation  MSK: Normal gait, no clubbing cyanosis, edema  Skin: No rashes, palpation reveals normal skin turgor  Psychiatric: Appropriate judgment and insight, oriented to person, place, and time  Abdomen Inspection Hernias - Inguinal hernia - Right - Reducible.    Assessment & Plan Ralene Ok MD; 04/17/2017 2:19 PM) RIGHT INGUINAL HERNIA (K40.90) Impression: 76 year old male with a past medical history significant for PE, MI, prostate cancer, and PCV, who comes in today with a right inguinal hernia. 1. The patient will require cardiac clearance by Dr. Gwenlyn Found. He will also require to be off of his Coumadin. We will discuss this with Dr. Librarian, academic. 2. Once the patient has been cleared from the above and decides on surgery we would proceed with a laparoscopic right inguinal hernia repair with mesh 3. All risks and benefits were discussed with the patient to generally include, but not limited to: infection, bleeding, damage  to surrounding structures, acute and chronic nerve pain, and recurrence. Alternatives were offered and described. All questions were answered and the patient voiced understanding of the procedure and wishes to proceed at this point with hernia repair.

## 2017-04-19 ENCOUNTER — Telehealth: Payer: Self-pay | Admitting: Cardiovascular Disease

## 2017-04-19 NOTE — Telephone Encounter (Signed)
Requesting surgical clearance:  1. Type of surgery: Laparoscopic Right Inguinal Hernia Repair with Mesh  2. Surgeon: Ralene Ok, MD  3.Surgical Date:  tbd  4. Medications that need to be held: Coumadin--managed by Dr. Delfina Redwood (PCP), [facility] has requested meds for lovenox bridging from him.   5. CAD: Yes  6. I will defer to:  Dr. Pearla Dubonnet Information:  [facility] Phone:  [phone] Fax:  [phone]

## 2017-04-22 NOTE — Telephone Encounter (Signed)
That's fine with me. Gregory Blankenship, can we manage his Lovenox bridge??

## 2017-04-23 NOTE — Telephone Encounter (Signed)
Reviewed information with wife.  Once a date for surgery is set we can have patient come into office for Lovenox training/education.  Wife agreeable to this.    Patient has remote history of PE after prostate surgery, will need bridge for upcoming hernia repair.    For Dr. Rosendo Gros:  Madaline Brilliant to hold warfarin x 5 days prior to procedure, we will initiate and manage enoxaparin bridging for this procedure.  Please let us know as soon as surgery date is scheduled, so that we can coordinate with patient.  Thank you.

## 2017-04-24 ENCOUNTER — Ambulatory Visit: Payer: Self-pay | Admitting: General Surgery

## 2017-04-24 NOTE — Telephone Encounter (Signed)
Routed to # provided to make aware.

## 2017-04-25 ENCOUNTER — Telehealth: Payer: Self-pay | Admitting: Cardiovascular Disease

## 2017-04-25 NOTE — Telephone Encounter (Signed)
New Message     Surgery date is August 2nd @ 730am at [facility] main hospital

## 2017-04-26 NOTE — Telephone Encounter (Signed)
Pt called to confirm we have procedure date.  Will need lovenox bridge.  Appt set for July 24.

## 2017-05-01 ENCOUNTER — Ambulatory Visit (INDEPENDENT_AMBULATORY_CARE_PROVIDER_SITE_OTHER): Payer: Medicare Other | Admitting: Pharmacist Clinician (PhC)/ Clinical Pharmacy Specialist

## 2017-05-01 DIAGNOSIS — Z7901 Long term (current) use of anticoagulants: Secondary | ICD-10-CM | POA: Diagnosis not present

## 2017-05-01 DIAGNOSIS — I2699 Other pulmonary embolism without acute cor pulmonale: Secondary | ICD-10-CM

## 2017-05-01 LAB — POCT INR: INR: 1.8

## 2017-05-01 MED ORDER — ENOXAPARIN SODIUM 80 MG/0.8ML ~~LOC~~ SOLN: 80.0000 mg | Freq: Two times a day (BID) | SUBCUTANEOUS | 0 refills | Status: DC

## 2017-05-01 NOTE — Patient Instructions (Addendum)
July 27 -warfarin 4 mg in the evening   July 28- no warfarin; no enoxaparin  July 29 - no warfarin;  enoxaparin 80 mg at 8 am,  Enoxaparin 80 mg at 8 pm  July 30 - no warfarin; enoxaparin 80 mg at 8 am,  Enoxaparin 80 mg at 8 pm  July 31 - no warfarin; enoxaparin 80 mg at 8 am,  Enoxaparin 80 mg at 8 pm  August 1 - no warfarin; enoxaparin 80 mg at 8 am  August 2 - procedure, no enoxaparin, restart warfarin unless told otherwise by MD  August 3 - Warfarin 6 mg in the evening; enoxaparin 80 mg at 8 am,  Enoxaparin 80 mg at 8 pm  August 4 - Warfarin 6 mg in the evening; enoxaparin 80 mg at 8 am,  Enoxaparin 80 mg at 8 pm  August 5 - Warfarin 6 mg in the eveningenoxaparin 80 mg at 8 am,  Enoxaparin 80 mg at 8 pm  August 6 - Warfarin 4 mg in the evening; enoxaparin 80 mg at 8 am,  Enoxaparin 80 mg at 8 pm  August 7 - Warfarin 4 mg in the evening; enoxaparin 80 mg at 8 am,  Enoxaparin 80 mg at 8 pm  August 8 - repeat INR at Dr. Kennon Holter office

## 2017-05-03 NOTE — Pre-Procedure Instructions (Signed)
Gregory Blankenship  05/03/2017      Gregory Blankenship, [address] Blank[address] [place] Blank[place]hip [address] Phone: [phone] Fax: [phone]  [facility] - [place], [place] - [address] AT [address] [place] [address] Phone: [phone] Fax: [phone]  [id number] [place], Time - [address] AT [place] [address] Phone: [phone] Fax: [phone]  [facility] [place], Gregory Blankenship [place] [address] Phone: [phone] Fax: [phone]    Your procedure is scheduled on August 2  Report to [place] at [place].M.  Call this number if you have problems the morning of surgery:  [phone]   Remember:  Do not eat food or drink liquids after midnight.  Continue all other medications as directed by your physician except follow these instructions about you medications    Take these medicines the morning of surgery with A SIP OF WATER acetaminophen (TYLENOL)  If needed, carvedilol (COREG), isosorbide mononitrate (IMDUR), levothyroxine (SYNTHROID, LEVOTHROID), NITROSTAT if needed  7 days prior to surgery STOP taking any Aleve, Naproxen, Ibuprofen, Motrin, Advil, Goody's, BC's, all herbal medications, fish oil, and all vitamins  Follow Physicians instructions about warfarin (COUMADIN),  enoxaparin (LOVENOX), and Aspirin    Do not wear jewelry  Do not wear lotions, powders, or cologne, or deoderant.  Men may shave face and neck.  Do not bring valuables to the hospital.  [facility] is not responsible for any belongings or valuables.  Contacts, dentures or bridgework may not be worn into surgery.  Leave your suitcase in the car.  After surgery it may be brought to your room.  For patients admitted to the hospital,  discharge time will be determined by your treatment team.  Patients discharged the day of surgery will not be allowed to drive home.    Special instructions:   Gregory Blankenship- Preparing For Surgery  Before surgery, you can play an important role. Because skin is not sterile, your skin needs to be as free of germs as possible. You can reduce the number of germs on your skin by washing with CHG (chlorahexidine gluconate) Soap before surgery.  CHG is an antiseptic cleaner which kills germs and bonds with the skin to continue killing germs even after washing.  Please do not use if you have an allergy to CHG or antibacterial soaps. If your skin becomes reddened/irritated stop using the CHG.  Do not shave (including legs and underarms) for at least 48 hours prior to first CHG shower. It is OK to shave your face.  Please follow these instructions carefully.   1. Shower the NIGHT BEFORE SURGERY and the MORNING OF SURGERY with CHG.   2. If you chose to wash your hair, wash your hair first as usual with your normal shampoo.  3. After you shampoo, rinse your hair and body thoroughly to remove the shampoo.  4. Use CHG as you would any other liquid soap. You can apply CHG directly to the skin and wash gently with a scrungie or a clean washcloth.   5. Apply the CHG Soap to your body ONLY FROM THE NECK DOWN.  Do not use on open wounds or open sores. Avoid contact with your eyes, ears, mouth and genitals (  private parts). Wash genitals (private parts) with your normal soap.  6. Wash thoroughly, paying special attention to the area where your surgery will be performed.  7. Thoroughly rinse your body with warm water from the neck down.  8. DO NOT shower/wash with your normal soap after using and rinsing off the CHG Soap.  9. Pat yourself dry with a CLEAN TOWEL.   10. Wear CLEAN PAJAMAS   11. Place CLEAN SHEETS on your bed the night of your first shower and DO NOT SLEEP WITH PETS.    Day of  Surgery: Do not apply any deodorants/lotions. Please wear clean clothes to the hospital/surgery center.      Please read over the following fact sheets that you were given.

## 2017-05-04 ENCOUNTER — Encounter (HOSPITAL_COMMUNITY): Payer: Self-pay

## 2017-05-04 ENCOUNTER — Encounter (HOSPITAL_COMMUNITY)
Admission: RE | Admit: 2017-05-04 | Discharge: 2017-05-04 | Disposition: A | Discharge status: Home / Self Care | Payer: Medicare Other | Source: Ambulatory Visit | Attending: General Surgery | Admitting: General Surgery

## 2017-05-04 DIAGNOSIS — Z01818 Encounter for other preprocedural examination: Secondary | ICD-10-CM | POA: Diagnosis present

## 2017-05-04 DIAGNOSIS — K4090 Unilateral inguinal hernia, without obstruction or gangrene, not specified as recurrent: Secondary | ICD-10-CM | POA: Insufficient documentation

## 2017-05-04 LAB — CBC
HEMATOCRIT: 52.4 % — AB (ref 39.0–52.0)
HEMOGLOBIN: 17 g/dL (ref 13.0–17.0)
MCH: 22.3 pg — AB (ref 26.0–34.0)
MCHC: 32.4 g/dL (ref 30.0–36.0)
MCV: 68.8 fL — ABNORMAL LOW (ref 78.0–100.0)
Platelets: 711 10*3/uL — ABNORMAL HIGH (ref 150–400)
RBC: 7.62 MIL/uL — ABNORMAL HIGH (ref 4.22–5.81)
RDW: 17.9 % — ABNORMAL HIGH (ref 11.5–15.5)
WBC: 31.5 10*3/uL — ABNORMAL HIGH (ref 4.0–10.5)

## 2017-05-04 LAB — BASIC METABOLIC PANEL
ANION GAP: 8 (ref 5–15)
BUN: 18 mg/dL (ref 6–20)
CO2: 25 mmol/L (ref 22–32)
Calcium: 9.4 mg/dL (ref 8.9–10.3)
Chloride: 105 mmol/L (ref 101–111)
Creatinine, Ser: 1.42 mg/dL — ABNORMAL HIGH (ref 0.61–1.24)
GFR calc Af Amer: 55 mL/min — ABNORMAL LOW (ref >60)
GFR, EST NON AFRICAN AMERICAN: 47 mL/min — AB (ref >60)
GLUCOSE: 80 mg/dL (ref 65–99)
POTASSIUM: 4.7 mmol/L (ref 3.5–5.1)
Sodium: 138 mmol/L (ref 135–145)

## 2017-05-04 MED ORDER — CHLORHEXIDINE GLUCONATE CLOTH 2 % EX PADS: 6.0000 | MEDICATED_PAD | Freq: Once | CUTANEOUS | Status: DC

## 2017-05-04 NOTE — Progress Notes (Signed)
PCP - Seward Carol Cardiologist - Dr. Gwenlyn Found, pt reports having to see Dr. Gwenlyn Found prior to surgery and was given instructions on when to stop medicine.  Patient takes coumadin, to use Lovenox bridge prior to surgery. Pt to stop Coumadin 05/06/17 and start lovenox. Pt was instructed by MD to stop Aspirin as well and stated that he has already done this.   EKG - 08/16/16 ECHO - 03/26/12 Cardiac Cath - 12/02/11  Patient denies shortness of breath, fever, cough and chest pain at PAT appointment   Patient verbalized understanding of instructions that were given to them at the PAT appointment. Patient was also instructed that they will need to review over the PAT instructions again at home before surgery.

## 2017-05-07 ENCOUNTER — Other Ambulatory Visit: Payer: Self-pay | Admitting: Internal Medicine

## 2017-05-07 ENCOUNTER — Ambulatory Visit
Admission: RE | Admit: 2017-05-07 | Discharge: 2017-05-07 | Disposition: A | Discharge status: Home / Self Care | Payer: Medicare Other | Source: Ambulatory Visit | Attending: Internal Medicine | Admitting: Internal Medicine

## 2017-05-07 ENCOUNTER — Encounter (HOSPITAL_COMMUNITY): Payer: Self-pay | Admitting: Emergency Medicine

## 2017-05-07 ENCOUNTER — Telehealth: Payer: Self-pay | Admitting: Pharmacist Clinician (PhC)/ Clinical Pharmacy Specialist

## 2017-05-07 DIAGNOSIS — M7989 Other specified soft tissue disorders: Secondary | ICD-10-CM

## 2017-05-07 DIAGNOSIS — Z7901 Long term (current) use of anticoagulants: Secondary | ICD-10-CM | POA: Diagnosis not present

## 2017-05-07 DIAGNOSIS — L539 Erythematous condition, unspecified: Secondary | ICD-10-CM | POA: Diagnosis not present

## 2017-05-07 NOTE — Telephone Encounter (Signed)
Patient called, was scheduled for hernia surgery, is currently on lovenox bridge.  Developed phlebitis in leg (scan was negative for DVT) and surgery has now been cancelled until at least November (pt going to [place] to see dtr for 6 weeks).    Advised pt to continue enoxaparin and take 6 mg warfarin x 2 days.  Will repeat INR Wednesday am.  Pt and wife voiced understanding.

## 2017-05-08 DIAGNOSIS — Z86711 Personal history of pulmonary embolism: Secondary | ICD-10-CM | POA: Diagnosis not present

## 2017-05-08 DIAGNOSIS — K4090 Unilateral inguinal hernia, without obstruction or gangrene, not specified as recurrent: Secondary | ICD-10-CM | POA: Diagnosis not present

## 2017-05-08 DIAGNOSIS — D45 Polycythemia vera: Secondary | ICD-10-CM | POA: Diagnosis not present

## 2017-05-08 DIAGNOSIS — I809 Phlebitis and thrombophlebitis of unspecified site: Secondary | ICD-10-CM | POA: Diagnosis not present

## 2017-05-08 NOTE — Progress Notes (Signed)
Anesthesia Chart Review: Patient is a 76 year old male currently scheduled for laparoscopic right inguinal hernia on 05/10/17 by Dr. Ralene Ok. 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 HTN is listed (and also listed in cardiology notes), but patient denied. Goes by "Robin."  - PCP is Dr. Seward Carol. - Cardiologist is Dr. Quay Burow, last visit 08/17/16. He is aware of surgery plans and his office if managing Lovenox bridge while warfarin is on hold. - Hematologist is Dr. Zola Button. Last visit 02/06/17. Patient will likely require hydroxyurea in the future if blood count exceeded 100.   Meds include warfarin (for thrombosis prophylaxis, history PE; to hold 5 days prior to surgery; Lovenox bridge), ASA 81 mg, Coreg, Imdur, levothyroxine, Nitro, Zocor.   BP 108/65   Pulse (!) 57   Temp (!) 36.4 C   Resp 20   Ht 6' (1.829 m)   Wt 177 lb 8 oz (80.5 kg)   SpO2 97%   BMI 24.07 kg/m   EKG 08/16/16: SB at 48 bpm, LAD, low voltage QRS, septal infarct (age undetermined).  Echo 03/26/12:  Limited views obtained. Follow-up study to evaluate LVEF s/p MI 11/22/2011. The left ventricle is normal in size. Mild concentric LVH. EF approximately 50%. Transmitral spectral Doppler flow pattern is normal for age. Left ventricle systolic function is mildly reduced. There is moderate apical wall hypokinesis. There is mild septal hypokinesis. Left atrial size is normal. RVSP is normal. Borderline aortic  root dilatation. No significant valvular disease (mild MR, mild TR). EF improved since study on 12/05/2011 (EF 35-40% 12/05/11 and 12/03/11).  Cardiac cath 12/02/11: 1. Left main; normal  2. LAD; totally occluded in its midportion after a large first septal perforator. 3. Left circumflex; dominant and normal.  4. Right coronary artery; nondominant and normal. 5. Left ventriculography: The overall LVEF estimated  35 %  With wall motion abnormalities notable for akinesia of the distal anterolateral wall dyskinesia of the apex. IMPRESSION:Mr. Beale has an occluded LAD and antero-apical dyskinesia with an EF in the 35% range. We will proceed with attempt at PCI and stenting of his mid LAD. PCI (unsuccessful): Using a 6 [place] XB LAD 3.5 cm guide catheter along with a 014/190 cm length Asahi soft wire and a 2.0 x 12 mm long emerge balloon angioplasty was performed. The balloon was then upsized to a 2.25 mm x 20 mm long emerge balloon and multiple overlapping inflations were performed beyond the total occlusion however I was not able to restore antegrade flow suggesting that this was a chronic  Total occlusion.  LLE Venous U/S 05/07/17: IMPRESSION: No evidence of left lower extremity DVT. Findings consistent with prior laser occlusion of the left GSV. The calf segment of the GSV appears recanalized. There are multiple thrombosed varicosities in the distal thigh and proximal calf. This corresponds to acutely symptomatic areas and findings are consistent with superficial thrombophlebitis of varicosities.  Preoperative labs noted. Cr 1.42, stable when compared to 02/06/17 results (1.2-1.4 since 09/07/16). WBC 31.5, previously 25.1-29.9 since 11/14/16. PLT 711, previously 774-785 since 11/14/16. (Known polycythemia/leukocytosis/thrombosis followed by hematology.)  Glucose 80. He will need PT/INR on the day of surgery.   George Hugh [facility] Short Stay Center/Anesthesiology Phone [phone] 05/08/2017  10:28 AM

## 2017-05-09 ENCOUNTER — Telehealth: Payer: Self-pay | Admitting: Oncology

## 2017-05-09 ENCOUNTER — Ambulatory Visit (INDEPENDENT_AMBULATORY_CARE_PROVIDER_SITE_OTHER): Payer: Medicare Other | Admitting: Pharmacist

## 2017-05-09 ENCOUNTER — Ambulatory Visit (HOSPITAL_BASED_OUTPATIENT_CLINIC_OR_DEPARTMENT_OTHER): Payer: Medicare Other | Admitting: Oncology

## 2017-05-09 ENCOUNTER — Ambulatory Visit (HOSPITAL_BASED_OUTPATIENT_CLINIC_OR_DEPARTMENT_OTHER): Payer: Medicare Other

## 2017-05-09 ENCOUNTER — Other Ambulatory Visit (HOSPITAL_BASED_OUTPATIENT_CLINIC_OR_DEPARTMENT_OTHER): Payer: Medicare Other

## 2017-05-09 VITALS — BP 106/62 | HR 59 | Temp 97.8°F | Resp 17 | Ht 72.0 in | Wt 179.2 lb

## 2017-05-09 VITALS — BP 108/74 | HR 55 | Temp 97.8°F | Resp 16

## 2017-05-09 DIAGNOSIS — I2699 Other pulmonary embolism without acute cor pulmonale: Secondary | ICD-10-CM

## 2017-05-09 DIAGNOSIS — Z7901 Long term (current) use of anticoagulants: Secondary | ICD-10-CM

## 2017-05-09 DIAGNOSIS — D45 Polycythemia vera: Secondary | ICD-10-CM

## 2017-05-09 DIAGNOSIS — D751 Secondary polycythemia: Secondary | ICD-10-CM

## 2017-05-09 DIAGNOSIS — D72828 Other elevated white blood cell count: Secondary | ICD-10-CM

## 2017-05-09 LAB — CBC WITH DIFFERENTIAL/PLATELET
BASO%: 0.9 % (ref 0.0–2.0)
BASOS ABS: 0.3 10*3/uL — AB (ref 0.0–0.1)
EOS%: 2.1 % (ref 0.0–7.0)
Eosinophils Absolute: 0.6 10*3/uL — ABNORMAL HIGH (ref 0.0–0.5)
HCT: 53 % — ABNORMAL HIGH (ref 38.4–49.9)
HEMOGLOBIN: 16.6 g/dL (ref 13.0–17.1)
LYMPH#: 1.9 10*3/uL (ref 0.9–3.3)
LYMPH%: 6.1 % — ABNORMAL LOW (ref 14.0–49.0)
MCH: 21.9 pg — AB (ref 27.2–33.4)
MCHC: 31.3 g/dL — AB (ref 32.0–36.0)
MCV: 69.8 fL — AB (ref 79.3–98.0)
MONO#: 3.5 10*3/uL — ABNORMAL HIGH (ref 0.1–0.9)
MONO%: 11.2 % (ref 0.0–14.0)
NEUT#: 24.8 10*3/uL — ABNORMAL HIGH (ref 1.5–6.5)
NEUT%: 79.7 % — AB (ref 39.0–75.0)
Platelets: 544 10*3/uL — ABNORMAL HIGH (ref 140–400)
RBC: 7.6 10*6/uL — AB (ref 4.20–5.82)
RDW: 17.2 % — ABNORMAL HIGH (ref 11.0–14.6)
WBC: 31.2 10*3/uL — ABNORMAL HIGH (ref 4.0–10.3)
nRBC: 0 % (ref 0–0)

## 2017-05-09 LAB — COMPREHENSIVE METABOLIC PANEL
ALK PHOS: 99 U/L (ref 40–150)
ALT: 41 U/L (ref 0–55)
AST: 49 U/L — AB (ref 5–34)
Albumin: 3.9 g/dL (ref 3.5–5.0)
Anion Gap: 9 mEq/L (ref 3–11)
BUN: 16.7 mg/dL (ref 7.0–26.0)
CHLORIDE: 105 meq/L (ref 98–109)
CO2: 25 meq/L (ref 22–29)
Calcium: 9.4 mg/dL (ref 8.4–10.4)
Creatinine: 1.2 mg/dL (ref 0.7–1.3)
EGFR: 57 mL/min/{1.73_m2} — AB (ref >90)
GLUCOSE: 94 mg/dL (ref 70–140)
POTASSIUM: 4.3 meq/L (ref 3.5–5.1)
SODIUM: 140 meq/L (ref 136–145)
Total Bilirubin: 0.55 mg/dL (ref 0.20–1.20)
Total Protein: 6.7 g/dL (ref 6.4–8.3)

## 2017-05-09 LAB — POCT INR: INR: 1.6

## 2017-05-09 LAB — TECHNOLOGIST REVIEW

## 2017-05-09 NOTE — Telephone Encounter (Signed)
Gave patient avs report and appointments for October  °

## 2017-05-09 NOTE — Progress Notes (Signed)
Hematology and Oncology Follow Up Visit  Gregory Blankenship [id number] 04/08/1941 76 y.o. 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 He has JAK 2 positive mutation  Secondary diagnosis: History of prostate cancer diagnosed in 2011. He is also history of coronary arteries disease had a pulmonary embolus.  Current therapy:  Phlebotomy to keep his Hgb less than 17 and hematocrit less than 45.  Interim History: Gregory Blankenship presents today for a followup visit. Since his last visit, he reports doing well overall. He was scheduled to have a hernia operation but was delayed because of superficial phlebitis that he was diagnosed with recently. He was switched to Lovenox temporarily and during this transition period he developed left lower extremity superficial phlebitis without deep vein thrombosis. He is back on Coumadin at this time and it appears to be improving. Otherwise he is doing reasonably well and ready to travel to [place]. He denied any abdominal pain, as time cure constitutional symptoms. He remains active and attends activities of daily living. He denied any recent hospitalizations or illnesses. He denied any cardiac complications.  He has not reported any fevers chills or sweats. He does not report any fevers, chills, sweats or changes in his appetite. He does not report any orthopnea, leg edema or dyspnea on exertion. Has not reported any nausea or vomiting. Has not reported any syncope or alteration of mental status. As that report any frequency urgency or hesitancy. He does not report any skeletal complaints of arthralgias or myalgias. Rest of his review of systems unremarkable.  Medications: I have reviewed the patient's current medications. Current Outpatient Prescriptions  Medication Sig Dispense Refill  . acetaminophen (TYLENOL) 325 MG tablet Take 325 mg by mouth every 6 (six)  hours as needed for mild pain.     Marland Kitchen aspirin 81 MG tablet Take 81 mg by mouth every evening.     . carvedilol (COREG) 6.25 MG tablet TAKE 1 TABLET BY MOUTH TWICE DAILY 180 tablet 3  . enoxaparin (LOVENOX) 80 MG/0.8ML injection Inject 0.8 mLs (80 mg total) into the skin every 12 (twelve) hours. 18 Syringe 0  . isosorbide mononitrate (IMDUR) 30 MG 24 hr tablet TAKE 1 TABLET BY MOUTH ONCE DAILY 90 tablet 3  . levothyroxine (SYNTHROID, LEVOTHROID) 88 MCG tablet TAKE 1 TABLET BY MOUTH EVERY MORNING  2  . NITROSTAT 0.4 MG SL tablet PLACE 1 TABELT UNDER THE TONGUE EVERY 5 MINUTES AS NEEDED FOR CHEST PAIN. 25 tablet 3  . simvastatin (ZOCOR) 20 MG tablet Take 20 mg by mouth every evening. Managed by PCP, Dr. Delfina Redwood    . warfarin (COUMADIN) 4 MG tablet Take 4 mg by mouth daily. TAKES 4 MG DAILY 7 DAYS WEEK     No current facility-administered medications for this visit.     Allergies:  Allergies  Allergen Reactions  . Erythromycin Nausea Only    Past Medical History, Surgical history, Social history, and Family History were reviewed and updated.   Physical Exam:  Blood pressure 106/62, pulse (!) 59, temperature 97.8 F (36.6 C), temperature source Oral, resp. rate 17, height 6' (1.829 m), weight 179 lb 3.2 oz (81.3 kg), SpO2 99 %. ECOG: 1 General appearance: Alert, awake gentleman without distress. Head: No oral ulcers or lesions. Sclerae anicteric. Neck: Supple without adenopathy. No thyroid masses.  Heart: Regular rate and rhythm. S1, S2. No murmurs or gallops. Lungs: Clear to auscultation.  No rhonchi, wheeze, no dullness to percussion.  Abdomen: Soft, nontender. No hepatosplenomegaly. No rebound or guarding. Extremities: No clubbing, cyanosis, or edema. No joint swelling or tenderness. Neurologic: No deficits noted.   Lab Results: Lab Results  Component Value Date   WBC 31.2 (H) 05/09/2017   HGB 16.6 05/09/2017   HCT 53.0 (H) 05/09/2017   MCV 69.8 (L) 05/09/2017   PLT 544 (H)  05/09/2017     Chemistry      Component Value Date/Time   NA 138 05/04/2017 0935   NA 139 02/06/2017 0843   K 4.7 05/04/2017 0935   K 4.7 02/06/2017 0843   CL 105 05/04/2017 0935   CL 102 02/05/2013 1429   CO2 25 05/04/2017 0935   CO2 27 02/06/2017 0843   BUN 18 05/04/2017 0935   BUN 18.8 02/06/2017 0843   CREATININE 1.42 (H) 05/04/2017 0935   CREATININE 1.4 (H) 02/06/2017 0843      Component Value Date/Time   CALCIUM 9.4 05/04/2017 0935   CALCIUM 9.5 02/06/2017 0843   ALKPHOS 82 02/06/2017 0843   AST 29 02/06/2017 0843   ALT 30 02/06/2017 0843   BILITOT 0.79 02/06/2017 0843      Impression and Plan:  76 year old man   1. 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 He does have a JAK2 mutation also has leukocytosis and thrombocytosis.  He is currently getting periodic phlebotomy to maintain his hematocrit below 45.   His hematocrit slightly elevated today at 53 I will receive a phlebotomy at this time.  The plan is to continue with intermittent phlebotomy to keep his hematocrit close to the target.   2. Thrombosis prophylaxis: He is continued to be anticoagulated with Coumadin and low-dose aspirin. He did develop superficial phlebitis which is likely complications related to polycythemia as well as transition between Coumadin and Lovenox.  3. Cytoreduction strategies: He will likely require hydroxyurea in the future if his blood count exceeded 800.  4. Follow-up: With 3 months.  [facility], MD 8/1/20189:14 AM

## 2017-05-09 NOTE — Patient Instructions (Signed)

## 2017-05-09 NOTE — Progress Notes (Signed)
Pt HCT 53 today Dr Alen Blew wanted to phlebotomize. 16G used in left AC, withdrew 513 Grams from pt, started at 1000 finished at 1010. Pt tolerated well, VSS pt ate and drank after and stayed for 30 min observation. Pt in stable condition at discharge.

## 2017-05-10 ENCOUNTER — Encounter (HOSPITAL_COMMUNITY): Admission: RE | Payer: Self-pay | Source: Ambulatory Visit

## 2017-05-10 ENCOUNTER — Ambulatory Visit (HOSPITAL_COMMUNITY): Admission: RE | Admit: 2017-05-10 | Payer: Medicare Other | Source: Ambulatory Visit | Admitting: General Surgery

## 2017-05-10 SURGERY — REPAIR, HERNIA, INGUINAL, LAPAROSCOPIC
Anesthesia: General | Laterality: Right

## 2017-05-11 ENCOUNTER — Telehealth: Payer: Self-pay | Admitting: Pharmacist Clinician (PhC)/ Clinical Pharmacy Specialist

## 2017-05-11 DIAGNOSIS — Z7901 Long term (current) use of anticoagulants: Secondary | ICD-10-CM | POA: Diagnosis not present

## 2017-05-11 NOTE — Telephone Encounter (Signed)
INR checked at PCP (Dr. Delfina Redwood) today was 2.1.  Advised patient to d/c enoxaparin and continue with warfarin 4 mg daily.  Would recommend repeat INR in 2 weeks then continue with normal monitoring.  Patient voiced understanding.

## 2017-05-28 DIAGNOSIS — Z7901 Long term (current) use of anticoagulants: Secondary | ICD-10-CM | POA: Diagnosis not present

## 2017-05-28 DIAGNOSIS — E039 Hypothyroidism, unspecified: Secondary | ICD-10-CM | POA: Diagnosis not present

## 2017-06-05 DIAGNOSIS — Z7901 Long term (current) use of anticoagulants: Secondary | ICD-10-CM | POA: Diagnosis not present

## 2017-06-13 ENCOUNTER — Telehealth: Payer: Self-pay | Admitting: Pharmacist

## 2017-06-13 ENCOUNTER — Encounter (INDEPENDENT_AMBULATORY_CARE_PROVIDER_SITE_OTHER): Payer: Medicare Other

## 2017-06-13 DIAGNOSIS — I868 Varicose veins of other specified sites: Secondary | ICD-10-CM

## 2017-06-13 NOTE — Telephone Encounter (Signed)
Received called from Dr Rosendo Gros office. Surgery re-scheduled for October/30/2018/. Patient will need bridging plan.   Talked to patient today. Bridging plan will be done by PCP office (per patient's statement).

## 2017-06-14 DIAGNOSIS — Z7901 Long term (current) use of anticoagulants: Secondary | ICD-10-CM | POA: Diagnosis not present

## 2017-07-26 ENCOUNTER — Ambulatory Visit: Payer: Self-pay | Admitting: General Surgery

## 2017-07-26 NOTE — H&P (View-Only) (Signed)
History of Present Illness Ralene Ok MD; 04/17/2017 2:18 PM) The patient is a 76 year old male who presents with an inguinal hernia. Referred by: Dr. Jori Moll polite Chief Complaint: Right inguinal hernia  Patient is a 76 year old male with a past medical history significant for prostate cancer, previous MI, previous PE on Coumadin, PCV. Patient states he noticed a hernia approximately 3 weeks ago. He states he noticed a bulge in burning sensation to the right inguinal area, with some sporadic sharp pain. 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 He states that when he is on his feet for long time while working he notices more pain to the right inguinal area, this is the only modifying factor.  Of note the patient is had a previous robotic prostatectomy by Dr. Alinda Money.  Patient sees Dr. Gwenlyn Found for his cardiac follow-up Patient sees Dr. Dema Severin for his Coumadin management Patient sees Dr. Corinda Gubler his PCV.      Diagnostic Studies History Malachy Moan, [place]; 04/17/2017 1:46 PM) Colonoscopy  1-5 years ago  Allergies Malachy Moan, RMA; 04/17/2017 1:47 PM) Erythromycin *DERMATOLOGICALS*  Nausea.  Medication History Malachy Moan, [place]; 04/17/2017 1:49 PM) Aspirin (81MG  Tablet, Oral) Active. Carvedilol (6.25MG  Tablet, Oral) Active. Isosorbide Mononitrate ER (30MG  Tablet ER 24HR, Oral) Active. Warfarin Sodium (4MG  Tablet, Oral) Active. Simvastatin (20MG  Tablet, Oral) Active. Acetaminophen (325MG  Tablet, Oral) Active. Nitrostat (0.4MG  Tab Sublingual, Sublingual) Active. Medications Reconciled  Social History Malachy Moan, [place]; 04/17/2017 1:46 PM) Caffeine use  Tea. No alcohol use  No drug use  Tobacco use  Former smoker.  Family History Malachy Moan, [place]; 04/17/2017 1:46 PM) Breast Cancer  Mother. Heart Disease  Brother, Father. Respiratory Condition  Father.  Other Problems Malachy Moan,  RMA; 04/17/2017 1:46 PM) Congestive Heart Failure  Melanoma  Myocardial infarction  Prostate Cancer  Pulmonary Embolism / Blood Clot in Legs  Thyroid Disease  Vascular Disease     Review of Systems Ralene Ok MD; 04/17/2017 2:15 PM) General Not Present- Appetite Loss, Chills, Fatigue, Fever, Night Sweats, Weight Gain and Weight Loss. Skin Not Present- Change in Wart/Mole, Dryness, Hives, Jaundice, New Lesions, Non-Healing Wounds, Rash and Ulcer. HEENT Present- Wears glasses/contact lenses. Not Present- Earache, Hearing Loss, Hoarseness, Nose Bleed, Oral Ulcers, Ringing in the Ears, Seasonal Allergies, Sinus Pain, Sore Throat, Visual Disturbances and Yellow Eyes. Respiratory Not Present- Bloody sputum, Chronic Cough, Difficulty Breathing, Snoring and Wheezing. Breast Not Present- Breast Mass, Breast Pain, Nipple Discharge and Skin Changes. Cardiovascular Not Present- Chest Pain, Difficulty Breathing Lying Down, Leg Cramps, Palpitations, Rapid Heart Rate, Shortness of Breath and Swelling of Extremities. Gastrointestinal Not Present- Abdominal Pain, Bloating, Bloody Stool, Change in Bowel Habits, Chronic diarrhea, Constipation, Difficulty Swallowing, Excessive gas, Gets full quickly at meals, Hemorrhoids, Indigestion, Nausea, Rectal Pain and Vomiting. Male Genitourinary Not Present- Blood in Urine, Change in Urinary Stream, Frequency, Impotence, Nocturia, Painful Urination, Urgency and Urine Leakage. Musculoskeletal Not Present- Back Pain, Joint Pain, Joint Stiffness, Muscle Pain, Muscle Weakness and Swelling of Extremities. Neurological Not Present- Decreased Memory, Fainting, Headaches, Numbness, Seizures, Tingling, Tremor, Trouble walking and Weakness. Psychiatric Not Present- Anxiety, Bipolar, Change in Sleep Pattern, Depression, Fearful and Frequent crying. Endocrine Not Present- Cold Intolerance, Excessive Hunger, Hair Changes, Heat Intolerance, Hot flashes and New  Diabetes. Hematology Not Present- Blood Thinners, Easy Bruising, Excessive bleeding, Gland problems, HIV and Persistent Infections. All other systems negative  Vitals Malachy Moan RMA; 04/17/2017 1:49 PM) 04/17/2017 1:49 PM Weight: 178.8 lb  Height: 71in Body Surface Area: 2.01 m Body Mass Index: 24.94 kg/m  Temp.: 97.94F  Pulse: 53 (Regular)  BP: 118/80 (Sitting, Left Arm, Standard)       Physical Exam Ralene Ok MD; 04/17/2017 2:18 PM) The physical exam findings are as follows: Note:Constitutional: No acute distress, conversant, appears stated age  Eyes: Anicteric sclerae, moist conjunctiva, no lid lag  Neck: No thyromegaly, trachea midline, no cervical lymphadenopathy  Lungs: Clear to auscultation biilaterally, normal respiratory effot  Cardiovascular: regular rate & rhythm, no murmurs, no peripheal edema, pedal pulses 2+  GI: Soft, no masses or hepatosplenomegaly, non-tender to palpation  MSK: Normal gait, no clubbing cyanosis, edema  Skin: No rashes, palpation reveals normal skin turgor  Psychiatric: Appropriate judgment and insight, oriented to person, place, and time  Abdomen Inspection Hernias - Inguinal hernia - Right - Reducible.    Assessment & Plan Ralene Ok MD; 04/17/2017 2:19 PM) RIGHT INGUINAL HERNIA (K40.90) Impression: 76 year old male with a past medical history significant for PE, MI, prostate cancer, and PCV, who comes in today with a right inguinal hernia. 1. The patient will require cardiac clearance by Dr. Gwenlyn Found. He will also require to be off of his Coumadin. We will discuss this with Dr. Librarian, academic. 2. Once the patient has been cleared from the above and decides on surgery we would proceed with a laparoscopic right inguinal hernia repair with mesh 3. All risks and benefits were discussed with the patient to generally include, but not limited to: infection, bleeding, damage to surrounding structures, acute and chronic  nerve pain, and recurrence. Alternatives were offered and described. All questions were answered and the patient voiced understanding of the procedure and wishes to proceed at this point with hernia repair.

## 2017-07-26 NOTE — H&P (Signed)
History of Present Illness Gregory Ok MD; 04/17/2017 2:18 PM) The patient is a 76 year old male who presents with an inguinal hernia. Referred by: Dr. Jori Moll polite Chief Complaint: Right inguinal hernia  Patient is a 76 year old male with a past medical history significant for prostate cancer, previous MI, previous PE on Coumadin, PCV. Patient states he noticed a hernia approximately 3 weeks ago. He states he noticed a bulge in burning sensation to the right inguinal area, with some sporadic sharp pain. 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 He states that when he is on his feet for long time while working he notices more pain to the right inguinal area, this is the only modifying factor.  Of note the patient is had a previous robotic prostatectomy by Dr. Alinda Money.  Patient sees Dr. Gwenlyn Found for his cardiac follow-up Patient sees Dr. Dema Severin for his Coumadin management Patient sees Dr. Corinda Gubler his PCV.      Diagnostic Studies History Malachy Moan, [place]; 04/17/2017 1:46 PM) Colonoscopy  1-5 years ago  Allergies Malachy Moan, RMA; 04/17/2017 1:47 PM) Erythromycin *DERMATOLOGICALS*  Nausea.  Medication History Malachy Moan, [place]; 04/17/2017 1:49 PM) Aspirin (81MG  Tablet, Oral) Active. Carvedilol (6.25MG  Tablet, Oral) Active. Isosorbide Mononitrate ER (30MG  Tablet ER 24HR, Oral) Active. Warfarin Sodium (4MG  Tablet, Oral) Active. Simvastatin (20MG  Tablet, Oral) Active. Acetaminophen (325MG  Tablet, Oral) Active. Nitrostat (0.4MG  Tab Sublingual, Sublingual) Active. Medications Reconciled  Social History Malachy Moan, [place]; 04/17/2017 1:46 PM) Caffeine use  Tea. No alcohol use  No drug use  Tobacco use  Former smoker.  Family History Malachy Moan, [place]; 04/17/2017 1:46 PM) Breast Cancer  Mother. Heart Disease  Brother, Father. Respiratory Condition  Father.  Other Problems Malachy Moan,  RMA; 04/17/2017 1:46 PM) Congestive Heart Failure  Melanoma  Myocardial infarction  Prostate Cancer  Pulmonary Embolism / Blood Clot in Legs  Thyroid Disease  Vascular Disease     Review of Systems Gregory Ok MD; 04/17/2017 2:15 PM) General Not Present- Appetite Loss, Chills, Fatigue, Fever, Night Sweats, Weight Gain and Weight Loss. Skin Not Present- Change in Wart/Mole, Dryness, Hives, Jaundice, New Lesions, Non-Healing Wounds, Rash and Ulcer. HEENT Present- Wears glasses/contact lenses. Not Present- Earache, Hearing Loss, Hoarseness, Nose Bleed, Oral Ulcers, Ringing in the Ears, Seasonal Allergies, Sinus Pain, Sore Throat, Visual Disturbances and Yellow Eyes. Respiratory Not Present- Bloody sputum, Chronic Cough, Difficulty Breathing, Snoring and Wheezing. Breast Not Present- Breast Mass, Breast Pain, Nipple Discharge and Skin Changes. Cardiovascular Not Present- Chest Pain, Difficulty Breathing Lying Down, Leg Cramps, Palpitations, Rapid Heart Rate, Shortness of Breath and Swelling of Extremities. Gastrointestinal Not Present- Abdominal Pain, Bloating, Bloody Stool, Change in Bowel Habits, Chronic diarrhea, Constipation, Difficulty Swallowing, Excessive gas, Gets full quickly at meals, Hemorrhoids, Indigestion, Nausea, Rectal Pain and Vomiting. Male Genitourinary Not Present- Blood in Urine, Change in Urinary Stream, Frequency, Impotence, Nocturia, Painful Urination, Urgency and Urine Leakage. Musculoskeletal Not Present- Back Pain, Joint Pain, Joint Stiffness, Muscle Pain, Muscle Weakness and Swelling of Extremities. Neurological Not Present- Decreased Memory, Fainting, Headaches, Numbness, Seizures, Tingling, Tremor, Trouble walking and Weakness. Psychiatric Not Present- Anxiety, Bipolar, Change in Sleep Pattern, Depression, Fearful and Frequent crying. Endocrine Not Present- Cold Intolerance, Excessive Hunger, Hair Changes, Heat Intolerance, Hot flashes and New  Diabetes. Hematology Not Present- Blood Thinners, Easy Bruising, Excessive bleeding, Gland problems, HIV and Persistent Infections. All other systems negative  Vitals Malachy Moan RMA; 04/17/2017 1:49 PM) 04/17/2017 1:49 PM Weight: 178.8 lb  Height: 71in Body Surface Area: 2.01 m Body Mass Index: 24.94 kg/m  Temp.: 97.30F  Pulse: 53 (Regular)  BP: 118/80 (Sitting, Left Arm, Standard)       Physical Exam Gregory Ok MD; 04/17/2017 2:18 PM) The physical exam findings are as follows: Note:Constitutional: No acute distress, conversant, appears stated age  Eyes: Anicteric sclerae, moist conjunctiva, no lid lag  Neck: No thyromegaly, trachea midline, no cervical lymphadenopathy  Lungs: Clear to auscultation biilaterally, normal respiratory effot  Cardiovascular: regular rate & rhythm, no murmurs, no peripheal edema, pedal pulses 2+  GI: Soft, no masses or hepatosplenomegaly, non-tender to palpation  MSK: Normal gait, no clubbing cyanosis, edema  Skin: No rashes, palpation reveals normal skin turgor  Psychiatric: Appropriate judgment and insight, oriented to person, place, and time  Abdomen Inspection Hernias - Inguinal hernia - Right - Reducible.    Assessment & Plan Gregory Ok MD; 04/17/2017 2:19 PM) RIGHT INGUINAL HERNIA (K40.90) Impression: 76 year old male with a past medical history significant for PE, MI, prostate cancer, and PCV, who comes in today with a right inguinal hernia. 1. The patient will require cardiac clearance by Dr. Gwenlyn Found. He will also require to be off of his Coumadin. We will discuss this with Dr. Librarian, academic. 2. Once the patient has been cleared from the above and decides on surgery we would proceed with a laparoscopic right inguinal hernia repair with mesh 3. All risks and benefits were discussed with the patient to generally include, but not limited to: infection, bleeding, damage to surrounding structures, acute and chronic  nerve pain, and recurrence. Alternatives were offered and described. All questions were answered and the patient voiced understanding of the procedure and wishes to proceed at this point with hernia repair.

## 2017-07-31 DIAGNOSIS — Z7901 Long term (current) use of anticoagulants: Secondary | ICD-10-CM | POA: Diagnosis not present

## 2017-08-01 ENCOUNTER — Telehealth: Payer: Self-pay | Admitting: Cardiovascular Disease

## 2017-08-01 DIAGNOSIS — Z23 Encounter for immunization: Secondary | ICD-10-CM | POA: Diagnosis not present

## 2017-08-01 NOTE — Telephone Encounter (Signed)
New message    Debbie from Dr Rosendo Gros ofc  [phone] calling to inform of surgery date change. New date 11/13.

## 2017-08-02 ENCOUNTER — Inpatient Hospital Stay (HOSPITAL_COMMUNITY): Admission: RE | Admit: 2017-08-02 | Payer: Medicare Other | Source: Ambulatory Visit

## 2017-08-03 NOTE — Telephone Encounter (Signed)
Patient notified appointment is needed prior to clearance. Appt scheduled for Tuesday, 08/07/17 at [place] with Almyra Deforest, PA-C. Patient verbalized understanding, agreed with plan, and voiced appreciation for phone call.

## 2017-08-03 NOTE — Telephone Encounter (Signed)
Patient who have been followed by Dr. Gwenlyn Found and coumadin clinic, history of PE and CAD,  looks like the current surgery has been delayed multiple times. Originally cleared by Dr. Gwenlyn Found on 04/19/2017, deferred to coumadin clinic for lovenox bridging, last note on 06/13/2017 suggested his primary care provider will do the lovenox bridging. However it has been a year since he saw Dr. Gwenlyn Found, his surgery is now scheduled on for 08/21/2017, but his visit with Dr. Gwenlyn Found is 08/28/2017. Will need an earlier visit with cardiology, also need to clarify who will manage the lovenox bridge.

## 2017-08-07 ENCOUNTER — Encounter: Payer: Self-pay | Admitting: Physician Assistant

## 2017-08-07 ENCOUNTER — Ambulatory Visit (INDEPENDENT_AMBULATORY_CARE_PROVIDER_SITE_OTHER): Payer: Medicare Other | Admitting: Physician Assistant

## 2017-08-07 ENCOUNTER — Other Ambulatory Visit: Payer: Self-pay | Admitting: *Deleted

## 2017-08-07 VITALS — BP 112/60 | HR 58 | Ht 71.0 in | Wt 182.0 lb

## 2017-08-07 DIAGNOSIS — Z01810 Encounter for preprocedural cardiovascular examination: Secondary | ICD-10-CM | POA: Diagnosis not present

## 2017-08-07 DIAGNOSIS — E785 Hyperlipidemia, unspecified: Secondary | ICD-10-CM | POA: Diagnosis not present

## 2017-08-07 DIAGNOSIS — I2782 Chronic pulmonary embolism: Secondary | ICD-10-CM

## 2017-08-07 DIAGNOSIS — I10 Essential (primary) hypertension: Secondary | ICD-10-CM | POA: Diagnosis not present

## 2017-08-07 DIAGNOSIS — E039 Hypothyroidism, unspecified: Secondary | ICD-10-CM | POA: Diagnosis not present

## 2017-08-07 DIAGNOSIS — I2510 Atherosclerotic heart disease of native coronary artery without angina pectoris: Secondary | ICD-10-CM

## 2017-08-07 DIAGNOSIS — Z7901 Long term (current) use of anticoagulants: Secondary | ICD-10-CM

## 2017-08-07 DIAGNOSIS — D45 Polycythemia vera: Secondary | ICD-10-CM

## 2017-08-07 NOTE — Progress Notes (Signed)
Cardiology Office Note    Date:  08/09/2017   ID:  Gregory Blankenship., DOB 1941/03/18, MRN [id number]  PCP:  Seward Carol, MD  Cardiologist:  Dr. Gwenlyn Found  Chief Complaint  Patient presents with  . Follow-up    preop for hernia surgery requested by Dr. Rosendo Gros    History of Present Illness:  Gregory Blankenship. is a 76 y.o. male with PMH of HTN, HLD, hypothyroidism, polycythemia vera with JAK 2 mutation, h/o PE and CAD with chronically occluded LAD. He had a history of anterior MI in February 2013. He is on Coumadin anticoagulation because of prior PE. Cardiac catheterization the time showed left dominant system was occluded mid LAD, PCI was unsuccessful due to CTO. He had anterior wall abnormality with EF 30% and the anteroapical dyskinesia. His ejection fraction improved by the time when he had repeat echocardiogram in June 2013 up to 50%. He was last seen in November 2017 by Dr. Gwenlyn Found, he was doing well at the time. In August 2018 he had to be transitioned to Lovenox for hernia repair, he developed superficial phlebitis during that transition.  He presents today for preoperative clearance requested by Dr. Rosendo Gros for laparoscopic right inguinal hernia repair. He has been doing well since the last year. He has not had any chest pain, arm pain or shortness breath. His previous angina symptom was mainly arm pain but no chest pain. He current walk up at least 2 flights of stairs without any issue, he can walk 4 blocks away from his home and back without any chest discomfort or shortness breath. From cardiology perspective, he is cleared to proceed with surgery. I have discussed his case with Dr. Gwenlyn Found as well. He says Dr. Delfina Redwood his primary care provider will manage the Coumadin and Lovenox transition preop. He will need to restart Coumadin with lovenox bridging as soon as possible after surgery.   Past Medical History:  Diagnosis Date  . Arthritis   . Cancer [facility])    prostate surgery  .  Coronary artery disease   . Hyperlipidemia   . Hypertension    patient denies  . Hypothyroid   . Myocardial infarction ([place]) 2014  . Polycythemia   . Pulmonary embolism ([place])   . Varicose veins     Past Surgical History:  Procedure Laterality Date  . COLONOSCOPY WITH PROPOFOL N/A 05/16/2016   Procedure: COLONOSCOPY WITH PROPOFOL;  Surgeon: Garlan Fair, MD;  Location: [facility] ENDOSCOPY;  Service: Endoscopy;  Laterality: N/A;  . LEFT HEART CATHETERIZATION WITH CORONARY ANGIOGRAM N/A 12/02/2011   Procedure: LEFT HEART CATHETERIZATION WITH CORONARY ANGIOGRAM;  Surgeon: Lorretta Harp, MD;  Location: [facility] CATH LAB;  Service: Cardiovascular;  Laterality: N/A;  . PERCUTANEOUS CORONARY INTERVENTION-BALLOON ONLY  12/02/2011   Procedure: PERCUTANEOUS CORONARY INTERVENTION-BALLOON ONLY;  Surgeon: Lorretta Harp, MD;  Location: [facility] CATH LAB;  Service: Cardiovascular;;  Chronic total occlusion  . PROSTATE SURGERY  2011  . VASCULAR SURGERY     vericose veins in legs     Current Medications: Outpatient Medications Prior to Visit  Medication Sig Dispense Refill  . acetaminophen (TYLENOL) 325 MG tablet Take 325 mg by mouth every 6 (six) hours as needed for mild pain.     Marland Kitchen aspirin 81 MG tablet Take 81 mg by mouth every evening.     . carvedilol (COREG) 6.25 MG tablet TAKE 1 TABLET BY MOUTH TWICE DAILY 180 tablet 3  . isosorbide mononitrate (IMDUR) 30 MG 24 hr  tablet TAKE 1 TABLET BY MOUTH ONCE DAILY 90 tablet 3  . levothyroxine (SYNTHROID, LEVOTHROID) 88 MCG tablet TAKE 88 MCG BY MOUTH EVERY MORNING  2  . NITROSTAT 0.4 MG SL tablet PLACE 1 TABELT UNDER THE TONGUE EVERY 5 MINUTES AS NEEDED FOR CHEST PAIN. 25 tablet 3  . simvastatin (ZOCOR) 20 MG tablet Take 20 mg by mouth every evening. Managed by PCP, Dr. Delfina Redwood    . warfarin (COUMADIN) 4 MG tablet Take 4-5 mg by mouth See admin instructions. Take 5 mg by mouth daily in the evening on Monday, Wednesday and Friday. Take 4 mg by mouth daily in the  evening on all other days    . colchicine 0.6 MG tablet Take 0.6 mg by mouth every evening.    . enoxaparin (LOVENOX) 80 MG/0.8ML injection Inject 0.8 mLs (80 mg total) into the skin every 12 (twelve) hours. (Patient not taking: Reported on 07/30/2017) 18 Syringe 0   No facility-administered medications prior to visit.      Allergies:   Erythromycin   Social History   Social History  . Marital status: Married    Spouse name: N/A  . Number of children: N/A  . Years of education: N/A   Social History Main Topics  . Smoking status: Former Smoker    Quit date: 12/01/1968  . Smokeless tobacco: Never Used  . Alcohol use No     Comment: hx of 5-6 years ago an occasional beer or wine  . Drug use: No  . Sexual activity: Not Asked   Other Topics Concern  . None   Social History Narrative  . None     Family History:  The patient's family history includes Cancer in his brother, father, and mother; Coronary artery disease in his father; Stroke in his maternal grandfather; Varicose Veins in his mother.   ROS:   Please see the history of present illness.    ROS All other systems reviewed and are negative.   PHYSICAL EXAM:   VS:  BP 112/60   Pulse (!) 58   Ht 5\' 11"  (1.803 m)   Wt 182 lb (82.6 kg)   BMI 25.38 kg/m    GEN: Well nourished, well developed, in no acute distress  HEENT: normal  Neck: no JVD, carotid bruits, or masses Cardiac: RRR; no murmurs, rubs, or gallops,no edema  Respiratory:  clear to auscultation bilaterally, normal work of breathing GI: soft, nontender, nondistended, + BS MS: no deformity or atrophy  Skin: warm and dry, no rash Neuro:  Alert and Oriented x 3, Strength and sensation are intact Psych: euthymic mood, full affect  Wt Readings from Last 3 Encounters:  08/08/17 182 lb 1.6 oz (82.6 kg)  08/07/17 182 lb (82.6 kg)  05/09/17 179 lb 3.2 oz (81.3 kg)      Studies/Labs Reviewed:   EKG:  EKG is ordered today.  The ekg ordered today  demonstrates Sinus bradycardia, heart rate 58  Recent Labs: 05/09/2017: ALT 41; BUN 16.7; Creatinine 1.2; Potassium 4.3; Sodium 140 08/08/2017: HGB 15.5; Platelets 820   Lipid Panel    Component Value Date/Time   CHOL 133 08/10/2015 0813   TRIG 84 08/10/2015 0813   HDL 31 (L) 08/10/2015 0813   CHOLHDL 4.3 08/10/2015 0813   VLDL 17 08/10/2015 0813   LDLCALC 85 08/10/2015 0813    Additional studies/ records that were reviewed today include:   Cath 12/02/2011 ANGIOGRAPHIC RESULTS:   1. Left main; normal  2. LAD; totally occluded  in its midportion after a large first septal perforator 3. Left circumflex; dominant and normal.  4. Right coronary artery; nondominant and normal 5. Left ventriculography; RAO left ventriculogram was performed using  25 mL of Visipaque dye at 12 mL/second. The overall LVEF estimated  35 %  With wall motion abnormalities notable for akinesia of the distal anterolateral wall dyskinesia of the apex  IMPRESSION:Mr. Difiore has an occluded LAD and antero-apical dyskinesia with an EF in the 35% range. We will proceed with attempt at PCI and stenting of his mid LAD.    03/26/2012    ASSESSMENT:    1. Preop cardiovascular exam   2. Essential hypertension   3. Coronary artery disease involving native coronary artery of native heart without angina pectoris   4. Hyperlipidemia, unspecified hyperlipidemia type   5. Hypothyroidism, unspecified type   6. Polycythemia vera ([place])   7. Other chronic pulmonary embolism without acute cor pulmonale (HCC)      PLAN:  In order of problems listed above:  1. Preoperative clearance: I have discussed his case with Dr. Gwenlyn Found, from cardiology perspective he is cleared to proceed with surgery. According to the patient, his primary care's office will manage the Coumadin and Lovenox bridging before and after the surgery. He is on Coumadin for history of PE  2. CAD: No obvious angina, chronically occluded mid  LAD  3. Hypertension: Blood pressure well-controlled  4. Hyperlipidemia: Continue Zocor 20 minutes when daily, will defer lab work to primary care provider.  5. Hypothyroidism: On Synthroid, managed by primary care provider.  6. Polycythemia vera: Chronically elevated cell line, followed by Dr. Alen Blew of oncology service    Medication Adjustments/Labs and Tests Ordered: Current medicines are reviewed at length with the patient today.  Concerns regarding medicines are outlined above.  Medication changes, Labs and Tests ordered today are listed in the Patient Instructions below. Patient Instructions  Medication Instructions:  Your physician recommends that you continue on your current medications as directed. Please refer to the Current Medication list given to you today.  Labwork: Complete Lipid panel and lft with in the next year; you can have done here or with your primary care physician.    Testing/Procedures: None   Follow-Up: Your physician wants you to follow-up in: 12 months with Dr Gwenlyn Found. You will receive a reminder letter in the mail two months in advance. If you don't receive a letter, please call our office to schedule the follow-up appointment.  Any Other Special Instructions Will Be Listed Below (If Applicable). Shahd Occhipinti  Has cleared you for surgery with consent from Dr Gwenlyn Found; a copy of todays note will be sent to your Surgeon Dr Ramirez's  If you need a refill on your cardiac medications before your next appointment, please call your pharmacy.     Hilbert Corrigan, [place]  08/09/2017 5:42 AM    [address], [place], [place]  [address] Phone: [phone]; Fax: [phone]

## 2017-08-07 NOTE — Patient Instructions (Addendum)
Medication Instructions:  Your physician recommends that you continue on your current medications as directed. Please refer to the Current Medication list given to you today.  Labwork: Complete Lipid panel and lft with in the next year; you can have done here or with your primary care physician.    Testing/Procedures: None   Follow-Up: Your physician wants you to follow-up in: 12 months with Dr Gwenlyn Found. You will receive a reminder letter in the mail two months in advance. If you don't receive a letter, please call our office to schedule the follow-up appointment.  Any Other Special Instructions Will Be Listed Below (If Applicable). Hao  Has cleared you for surgery with consent from Dr Gwenlyn Found; a copy of todays note will be sent to your Surgeon Dr Ramirez's  If you need a refill on your cardiac medications before your next appointment, please call your pharmacy.

## 2017-08-08 ENCOUNTER — Other Ambulatory Visit (HOSPITAL_BASED_OUTPATIENT_CLINIC_OR_DEPARTMENT_OTHER): Payer: Medicare Other

## 2017-08-08 ENCOUNTER — Ambulatory Visit (HOSPITAL_BASED_OUTPATIENT_CLINIC_OR_DEPARTMENT_OTHER): Payer: Medicare Other

## 2017-08-08 ENCOUNTER — Ambulatory Visit (HOSPITAL_BASED_OUTPATIENT_CLINIC_OR_DEPARTMENT_OTHER): Payer: Medicare Other | Admitting: Oncology

## 2017-08-08 ENCOUNTER — Telehealth: Payer: Self-pay | Admitting: Oncology

## 2017-08-08 VITALS — BP 104/74 | HR 54 | Resp 18 | Ht 71.0 in | Wt 182.1 lb

## 2017-08-08 VITALS — BP 101/74 | HR 58 | Resp 18

## 2017-08-08 DIAGNOSIS — D45 Polycythemia vera: Secondary | ICD-10-CM | POA: Diagnosis not present

## 2017-08-08 DIAGNOSIS — D72828 Other elevated white blood cell count: Secondary | ICD-10-CM

## 2017-08-08 DIAGNOSIS — Z7901 Long term (current) use of anticoagulants: Secondary | ICD-10-CM

## 2017-08-08 LAB — CBC WITH DIFFERENTIAL/PLATELET
BASO%: 0.2 % (ref 0.0–2.0)
BASOS ABS: 0.1 10*3/uL (ref 0.0–0.1)
EOS%: 2.3 % (ref 0.0–7.0)
Eosinophils Absolute: 0.7 10*3/uL — ABNORMAL HIGH (ref 0.0–0.5)
HEMATOCRIT: 49.6 % (ref 38.4–49.9)
HEMOGLOBIN: 15.5 g/dL (ref 13.0–17.1)
LYMPH#: 2.2 10*3/uL (ref 0.9–3.3)
LYMPH%: 7.2 % — ABNORMAL LOW (ref 14.0–49.0)
MCH: 20.9 pg — ABNORMAL LOW (ref 27.2–33.4)
MCHC: 31.3 g/dL — ABNORMAL LOW (ref 32.0–36.0)
MCV: 66.7 fL — ABNORMAL LOW (ref 79.3–98.0)
MONO#: 2.4 10*3/uL — AB (ref 0.1–0.9)
MONO%: 7.7 % (ref 0.0–14.0)
NEUT#: 25.8 10*3/uL — ABNORMAL HIGH (ref 1.5–6.5)
NEUT%: 82.6 % — ABNORMAL HIGH (ref 39.0–75.0)
Platelets: 820 10*3/uL — ABNORMAL HIGH (ref 140–400)
RBC: 7.45 10*6/uL — ABNORMAL HIGH (ref 4.20–5.82)
RDW: 18.5 % — ABNORMAL HIGH (ref 11.0–14.6)
WBC: 31.3 10*3/uL — ABNORMAL HIGH (ref 4.0–10.3)

## 2017-08-08 LAB — TECHNOLOGIST REVIEW

## 2017-08-08 NOTE — Patient Instructions (Signed)

## 2017-08-08 NOTE — Progress Notes (Signed)
Hematology and Oncology Follow Up Visit  Gregory Blankenship [id number] 01-28-1941 76 y.o. 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 He has JAK 2 positive mutation  Secondary diagnosis: History of prostate cancer diagnosed in 2011. He is also history of coronary arteries disease had a pulmonary embolus.  Current therapy:  Phlebotomy to keep his hematocrit less than 45.  Interim History: Gregory Blankenship presents today for a followup visit. Since his last visit, he returned from a trip to [place] without any complications.  He does not report any bleeding or thrombosis episodes.  He denied any abdominal pain, as time cure constitutional symptoms. He remains active and attends activities of daily living. He denied any recent hospitalizations or illnesses. He denied any cardiac complications.  He is scheduled for hernia operation in the near future.  He continues to tolerate phlebotomy without any complications.  He has not reported any fevers chills or sweats. He does not report any fevers, chills, sweats or changes in his appetite. He does not report any orthopnea, leg edema or dyspnea on exertion. Has not reported any nausea or vomiting. Has not reported any syncope or alteration of mental status. As that report any frequency urgency or hesitancy. He does not report any skeletal complaints of arthralgias or myalgias. Rest of his review of systems unremarkable.  Medications: I have reviewed the patient's current medications. Current Outpatient Prescriptions  Medication Sig Dispense Refill  . acetaminophen (TYLENOL) 325 MG tablet Take 325 mg by mouth every 6 (six) hours as needed for mild pain.     Marland Kitchen aspirin 81 MG tablet Take 81 mg by mouth every evening.     . carvedilol (COREG) 6.25 MG tablet TAKE 1 TABLET BY MOUTH TWICE DAILY 180 tablet 3  . isosorbide mononitrate (IMDUR) 30 MG 24 hr tablet  TAKE 1 TABLET BY MOUTH ONCE DAILY 90 tablet 3  . levothyroxine (SYNTHROID, LEVOTHROID) 88 MCG tablet TAKE 88 MCG BY MOUTH EVERY MORNING  2  . NITROSTAT 0.4 MG SL tablet PLACE 1 TABELT UNDER THE TONGUE EVERY 5 MINUTES AS NEEDED FOR CHEST PAIN. 25 tablet 3  . simvastatin (ZOCOR) 20 MG tablet Take 20 mg by mouth every evening. Managed by PCP, Dr. Delfina Redwood    . warfarin (COUMADIN) 4 MG tablet Take 4-5 mg by mouth See admin instructions. Take 5 mg by mouth daily in the evening on Monday, Wednesday and Friday. Take 4 mg by mouth daily in the evening on all other days     No current facility-administered medications for this visit.     Allergies:  Allergies  Allergen Reactions  . Erythromycin Nausea Only    Past Medical History, Surgical history, Social history, and Family History were reviewed and updated.   Physical Exam:  Blood pressure 104/74, pulse (!) 54, resp. rate 18, height 5\' 11"  (1.803 m), weight 182 lb 1.6 oz (82.6 kg), SpO2 100 %. ECOG: 1 General appearance: Well-appearing gentleman without distress. Head: No oral ulcers or lesions.  Sclerae anicteric. Neck: Supple without adenopathy. No thyroid masses.  Heart: Regular rate and rhythm. S1, S2. No murmurs or gallops. Lungs: Clear to auscultation. No rhonchi, wheeze, no dullness to percussion.  Abdomen: Soft, nontender. No hepatosplenomegaly. No shifting dullness or ascites. Extremities: No clubbing, cyanosis, or edema.  Neurologic: No deficits noted.   Lab Results: Lab Results  Component Value Date   WBC 31.3 (H) 08/08/2017  HGB 15.5 08/08/2017   HCT 49.6 08/08/2017   MCV 66.7 (L) 08/08/2017   PLT 820 (H) 08/08/2017     Chemistry      Component Value Date/Time   NA 140 05/09/2017 0844   K 4.3 05/09/2017 0844   CL 105 05/04/2017 0935   CL 102 02/05/2013 1429   CO2 25 05/09/2017 0844   BUN 16.7 05/09/2017 0844   CREATININE 1.2 05/09/2017 0844      Component Value Date/Time   CALCIUM 9.4 05/09/2017 0844    ALKPHOS 99 05/09/2017 0844   AST 49 (H) 05/09/2017 0844   ALT 41 05/09/2017 0844   BILITOT 0.55 05/09/2017 0844      Impression and Plan:  76 year old man   1. 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 He does have a JAK2 mutation also has leukocytosis and thrombocytosis.  He is currently getting periodic phlebotomy to maintain his hematocrit below 45.   His hematocrit today is at 49 and will receive phlebotomy today.  This will be evaluated every 3 months at this time.   2. Thrombosis prophylaxis: He is continued to be anticoagulated with Coumadin and low-dose aspirin.  No recent issues at this time.  3. Cytoreduction strategies: Risks and benefits of hydroxyurea was discussed today given his elevated platelet counts.  For the time being we have deferred this option and will continue to monitor him closely.  His risk of thrombosis is lower given his full dose anticoagulation.  4.  Upcoming hernia surgery: I see no contraindication from a hematology standpoint given his elevated platelet count and white cell count.  5. Follow-up: With 3 months.  Zola Button, MD 10/31/20189:10 AM

## 2017-08-08 NOTE — Telephone Encounter (Signed)
Scheduled appt per 10/31 los - Gave patient AVS and calender per los.  

## 2017-08-09 ENCOUNTER — Encounter: Payer: Self-pay | Admitting: Physician Assistant

## 2017-08-15 ENCOUNTER — Other Ambulatory Visit: Payer: Self-pay

## 2017-08-15 ENCOUNTER — Encounter (HOSPITAL_COMMUNITY): Payer: Self-pay

## 2017-08-15 ENCOUNTER — Encounter (HOSPITAL_COMMUNITY)
Admission: RE | Admit: 2017-08-15 | Discharge: 2017-08-15 | Disposition: A | Discharge status: Home / Self Care | Payer: Medicare Other | Source: Ambulatory Visit | Attending: General Surgery | Admitting: General Surgery

## 2017-08-15 DIAGNOSIS — K4090 Unilateral inguinal hernia, without obstruction or gangrene, not specified as recurrent: Secondary | ICD-10-CM | POA: Insufficient documentation

## 2017-08-15 DIAGNOSIS — Z01818 Encounter for other preprocedural examination: Secondary | ICD-10-CM | POA: Insufficient documentation

## 2017-08-15 DIAGNOSIS — Z7901 Long term (current) use of anticoagulants: Secondary | ICD-10-CM | POA: Diagnosis not present

## 2017-08-15 HISTORY — DX: Disease of blood and blood-forming organs, unspecified: D75.9

## 2017-08-15 HISTORY — DX: Peripheral vascular disease, unspecified: I73.9

## 2017-08-15 LAB — COMPREHENSIVE METABOLIC PANEL
ALBUMIN: 3.7 g/dL (ref 3.5–5.0)
ALT: 15 U/L — ABNORMAL LOW (ref 17–63)
ANION GAP: 4 — AB (ref 5–15)
AST: 22 U/L (ref 15–41)
Alkaline Phosphatase: 69 U/L (ref 38–126)
BUN: 18 mg/dL (ref 6–20)
CHLORIDE: 108 mmol/L (ref 101–111)
CO2: 25 mmol/L (ref 22–32)
Calcium: 8.8 mg/dL — ABNORMAL LOW (ref 8.9–10.3)
Creatinine, Ser: 1.3 mg/dL — ABNORMAL HIGH (ref 0.61–1.24)
GFR calc Af Amer: >60 mL/min (ref >60)
GFR calc non Af Amer: 52 mL/min — ABNORMAL LOW (ref >60)
GLUCOSE: 70 mg/dL (ref 65–99)
POTASSIUM: 5 mmol/L (ref 3.5–5.1)
SODIUM: 137 mmol/L (ref 135–145)
Total Bilirubin: 0.6 mg/dL (ref 0.3–1.2)
Total Protein: 6.3 g/dL — ABNORMAL LOW (ref 6.5–8.1)

## 2017-08-15 LAB — CBC
HEMATOCRIT: 47.6 % (ref 39.0–52.0)
HEMOGLOBIN: 15.1 g/dL (ref 13.0–17.0)
MCH: 21.3 pg — AB (ref 26.0–34.0)
MCHC: 31.7 g/dL (ref 30.0–36.0)
MCV: 67.1 fL — AB (ref 78.0–100.0)
Platelets: 877 10*3/uL — ABNORMAL HIGH (ref 150–400)
RBC: 7.09 MIL/uL — ABNORMAL HIGH (ref 4.22–5.81)
RDW: 19.6 % — ABNORMAL HIGH (ref 11.5–15.5)
WBC: 31 10*3/uL — ABNORMAL HIGH (ref 4.0–10.5)

## 2017-08-15 NOTE — Pre-Procedure Instructions (Addendum)
Gregory Stamps Jr.  08/15/2017      [place], [address] [place]  [address] Phone: [phone] Fax: [phone]  [facility] - [place], [place] - [address] [place] [address] Phone: [phone] Fax: [phone]  [facility] [place], [place] - [address] [place] [address] Phone: [phone] Fax: [phone]  [facility] [place], [place] [address] [place] [address] Phone: [phone] Fax: [phone]    Your procedure is scheduled on 08/21/2017  Report to [facility] at 7:30 A.M.  Call this number if you have problems the morning of surgery:  [phone]   Remember:                 Refer to Dr. Rayna Sexton for instructions on Coumadin & Aspirin    Do not eat food or drink liquids after midnight.   Take these medicines the morning of surgery with A SIP OF WATER : Isosorbide,  Levothyroxine , Carvedilol   Do not wear jewelry   Do not wear lotions, powders, or perfumes, or deoderant.              Men may shave face and neck.   Do not bring valuables to the hospital.   [facility] is not responsible for any belongings or valuables.  Contacts, dentures or bridgework may not be worn into surgery.  Leave your suitcase in the car.  After surgery it may be brought to your room.  For patients admitted to the hospital, discharge time will be determined by your treatment team.  Patients discharged the day of surgery will not be allowed to drive home.   Name and phone number of your driver:   Wife  Special instructions: Special Instructions: [facility] - Preparing for Surgery  Before surgery, you can play an important role.  Because skin is not sterile, your skin needs to be  as free of germs as possible.  You can reduce the number of germs on you skin by washing with CHG (chlorahexidine gluconate) soap before surgery.  CHG is an antiseptic cleaner which kills germs and bonds with the skin to continue killing germs even after washing.  Please DO NOT use if you have an allergy to CHG or antibacterial soaps.  If your skin becomes reddened/irritated stop using the CHG and inform your nurse when you arrive at Short Stay.  Do not shave (including legs and underarms) for at least 48 hours prior to the first CHG shower.  You may shave your face.  Please follow these instructions carefully:   1.  Shower with CHG Soap the night before surgery and the  morning of Surgery.  2.  If you choose to wash your hair, wash your hair first as usual with your  normal shampoo.  3.  After you shampoo, rinse your hair and body thoroughly to remove the  Shampoo.  4.  Use CHG as you would any other liquid soap.  You can apply chg directly to the skin and wash gently with scrungie or a clean washcloth.  5.  Apply the CHG Soap to your body ONLY FROM THE NECK DOWN.    Do not use on open wounds or  open sores.  Avoid contact with your eyes, ears, mouth and genitals (private parts).  Wash genitals (private parts)   with your normal soap.  6.  Wash thoroughly, paying special attention to the area where your surgery will be performed.  7.  Thoroughly rinse your body with warm water from the neck down.  8.  DO NOT shower/wash with your normal soap after using and rinsing off   the CHG Soap.  9.  Pat yourself dry with a clean towel.            10.  Wear clean pajamas.            11.  Place clean sheets on your bed the night of your first shower and do not sleep with pets.  Day of Surgery  Do not apply any lotions/deodorants the morning of surgery.  Please wear clean clothes to the hospital/surgery center.  Please read over the following fact sheets that you were given. Pain Booklet, Coughing and  Deep Breathing and Surgical Site Infection Prevention

## 2017-08-15 NOTE — Progress Notes (Signed)
Call to A. Zelenak,PA-C, reviewed that pt. Will proceed to [facility] office now for INR & instructions on Coumadin schedule. She agrees & most proabably he will have a repeat PT/INR day of surgery. Pt. Aware & knowledgable about the need for direction on use of Coumadin/Lovenox leading up to surgery.

## 2017-08-16 NOTE — Progress Notes (Signed)
Anesthesia Chart Review: Patient is a 76 year old male currently scheduled for laparoscopic right inguinal hernia on 08/21/17 by Dr. Ralene Ok. 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 HTN is listed (and also in cardiology notes), but patient denied. Goes by "Gregory Blankenship."  - PCP is Dr. Seward Carol. Patient was reportedly seeing on 08/15/17 for perioperative Lovenox bridge instructions. - Cardiologist is Dr. Quay Burow. Last visit 110/30/18 with Almyra Deforest, PA-C. He wrote, "I have discussed his case with Dr. Gwenlyn Found, from cardiology perspective he is cleared to proceed with surgery. According to the patient, his primary care's office will manage the Coumadin and Lovenox bridging before and after the surgery. He is on Coumadin for history of PE." - Hematologist is Dr. Zola Button, last visit 08/08/17.  Patient received phlebotomy to keep HCT < 45. He is on Coumadin and low-dose ASA for thrombosis prophylaxis. He discussed risks and benefits of starting hydroxyurea given elevated platelet counts, but will hold off for now. He is aware of surgery plans.   Meds include ASA 81 mg, Coreg, Imdur, levothyroxine, Nitro, Zocor, warfarin (for thrombosis prophylaxis, history PE). As above, on 08/15/17 patient being instructed on warfarin and Lovenox bridging by Dr. Delfina Redwood.  BP 101/66   Pulse (!) 55   Temp (!) 36.4 C   Resp 20   Ht 5\' 11"  (1.803 m)   Wt 181 lb 12.8 oz (82.5 kg)   SpO2 100%    BMI 25.36 kg/m   EKG 08/07/17: SB at 58 bpm with sinus arrhythmia, possible left atrial enlargement, septal infarct (age undetermined).  Echo 03/26/12:  Limited views obtained. Follow-up study to evaluate LVEF s/p MI 11/22/2011. The left ventricle is normal in size. Mild concentric LVH. EF approximately 50%. Transmitral spectral Doppler flow pattern is normal for age. Left ventricle systolic function is mildly reduced. There is moderate apical wall hypokinesis. There is mild septal hypokinesis. Left atrial size is normal. RVSP is normal. Borderline aortic root dilatation. No significant valvular disease (mild MR, mild TR). EF improved since study on 12/05/2011 (EF 35-40% 12/05/11 and 12/03/11).  Cardiac cath 12/02/11: 1. Left main; normal  2. LAD; totally occluded in its midportion after a large first septal perforator. 3. Left circumflex; dominant and normal.  4. Right coronary artery; nondominant and normal. 5. Left ventriculography: The overall LVEF estimated  35 % With wall motion abnormalities notable for akinesia of the distal anterolateral wall dyskinesia of the apex. IMPRESSION:Mr. Zufall has an occluded LAD and antero-apical dyskinesia with an EF in the 35% range. We will proceed with attempt at PCI and stenting of his mid LAD. PCI (unsuccessful): Using a 6 [place] XB LAD 3.5 cm guide catheter along with a 014/190 cm length Asahi soft wire and a 2.0 x 12 mm long emerge balloon angioplasty was performed. The balloon was then upsized to a 2.25 mm x 20 mm long emerge balloon and multiple overlapping inflations were performed beyond the total occlusion however I was not able to restore antegrade flow suggesting that this was a chronic Total occlusion.  Preoperative labs noted. Cr 1.30,  stable when compared to labs since 11/2016. WBC 31.05, previously 25.1-31.5 since 11/14/16. PLT 877, previously 611-643 since 11/14/16. (Known polycythemia/leukocytosis/thrombosis followed by hematology.) Glucose  70. H/H 15.1/47.6 (s/p phlebotomy last week). He will need PT/INR on the day of surgery.  If no acute changes and follow-up labs acceptable then I would anticipate that he can proceed as planned.  George Hugh [facility] Short Stay Center/Anesthesiology Phone [phone] 08/16/2017 2:33 PM

## 2017-08-21 ENCOUNTER — Telehealth: Payer: Self-pay | Admitting: General Surgery

## 2017-08-21 ENCOUNTER — Encounter (HOSPITAL_COMMUNITY): Payer: Self-pay | Admitting: Emergency Medicine

## 2017-08-21 ENCOUNTER — Other Ambulatory Visit: Payer: Self-pay

## 2017-08-21 ENCOUNTER — Encounter (HOSPITAL_COMMUNITY): Payer: Self-pay | Admitting: Urology

## 2017-08-21 ENCOUNTER — Ambulatory Visit (HOSPITAL_COMMUNITY): Payer: Medicare Other | Admitting: Vascular Surgery

## 2017-08-21 ENCOUNTER — Ambulatory Visit (HOSPITAL_COMMUNITY)
Admission: RE | Admit: 2017-08-21 | Discharge: 2017-08-21 | Disposition: A | Discharge status: Home / Self Care | Payer: Medicare Other | Source: Ambulatory Visit | Attending: General Surgery | Admitting: General Surgery

## 2017-08-21 ENCOUNTER — Encounter (HOSPITAL_COMMUNITY)
Admission: RE | Disposition: A | Discharge status: Home / Self Care | Payer: Self-pay | Source: Ambulatory Visit | Attending: General Surgery

## 2017-08-21 ENCOUNTER — Emergency Department (HOSPITAL_COMMUNITY)
Admission: EM | Admit: 2017-08-21 | Discharge: 2017-08-21 | Disposition: A | Discharge status: Home / Self Care | Payer: Medicare Other | Source: Home / Self Care | Attending: Emergency Medicine | Admitting: Emergency Medicine

## 2017-08-21 DIAGNOSIS — Z9079 Acquired absence of other genital organ(s): Secondary | ICD-10-CM | POA: Diagnosis not present

## 2017-08-21 DIAGNOSIS — Z87891 Personal history of nicotine dependence: Secondary | ICD-10-CM | POA: Insufficient documentation

## 2017-08-21 DIAGNOSIS — Z7982 Long term (current) use of aspirin: Secondary | ICD-10-CM

## 2017-08-21 DIAGNOSIS — I252 Old myocardial infarction: Secondary | ICD-10-CM | POA: Insufficient documentation

## 2017-08-21 DIAGNOSIS — E785 Hyperlipidemia, unspecified: Secondary | ICD-10-CM | POA: Diagnosis not present

## 2017-08-21 DIAGNOSIS — Z881 Allergy status to other antibiotic agents status: Secondary | ICD-10-CM | POA: Diagnosis not present

## 2017-08-21 DIAGNOSIS — Z803 Family history of malignant neoplasm of breast: Secondary | ICD-10-CM | POA: Insufficient documentation

## 2017-08-21 DIAGNOSIS — Y733 Surgical instruments, materials and gastroenterology and urology devices (including sutures) associated with adverse incidents: Secondary | ICD-10-CM | POA: Insufficient documentation

## 2017-08-21 DIAGNOSIS — Z4889 Encounter for other specified surgical aftercare: Secondary | ICD-10-CM

## 2017-08-21 DIAGNOSIS — E039 Hypothyroidism, unspecified: Secondary | ICD-10-CM

## 2017-08-21 DIAGNOSIS — I10 Essential (primary) hypertension: Secondary | ICD-10-CM | POA: Insufficient documentation

## 2017-08-21 DIAGNOSIS — Z79899 Other long term (current) drug therapy: Secondary | ICD-10-CM | POA: Insufficient documentation

## 2017-08-21 DIAGNOSIS — K4090 Unilateral inguinal hernia, without obstruction or gangrene, not specified as recurrent: Secondary | ICD-10-CM | POA: Diagnosis not present

## 2017-08-21 DIAGNOSIS — I2510 Atherosclerotic heart disease of native coronary artery without angina pectoris: Secondary | ICD-10-CM | POA: Insufficient documentation

## 2017-08-21 DIAGNOSIS — Z8546 Personal history of malignant neoplasm of prostate: Secondary | ICD-10-CM | POA: Insufficient documentation

## 2017-08-21 DIAGNOSIS — Z86711 Personal history of pulmonary embolism: Secondary | ICD-10-CM | POA: Insufficient documentation

## 2017-08-21 DIAGNOSIS — K91841 Postprocedural hemorrhage and hematoma of a digestive system organ or structure following other procedure: Secondary | ICD-10-CM | POA: Diagnosis not present

## 2017-08-21 DIAGNOSIS — Z7901 Long term (current) use of anticoagulants: Secondary | ICD-10-CM | POA: Insufficient documentation

## 2017-08-21 DIAGNOSIS — Z8249 Family history of ischemic heart disease and other diseases of the circulatory system: Secondary | ICD-10-CM | POA: Insufficient documentation

## 2017-08-21 DIAGNOSIS — I509 Heart failure, unspecified: Secondary | ICD-10-CM | POA: Diagnosis not present

## 2017-08-21 DIAGNOSIS — M1990 Unspecified osteoarthritis, unspecified site: Secondary | ICD-10-CM | POA: Diagnosis not present

## 2017-08-21 DIAGNOSIS — T8189XA Other complications of procedures, not elsewhere classified, initial encounter: Secondary | ICD-10-CM

## 2017-08-21 DIAGNOSIS — Z8673 Personal history of transient ischemic attack (TIA), and cerebral infarction without residual deficits: Secondary | ICD-10-CM

## 2017-08-21 DIAGNOSIS — K91840 Postprocedural hemorrhage and hematoma of a digestive system organ or structure following a digestive system procedure: Secondary | ICD-10-CM | POA: Insufficient documentation

## 2017-08-21 DIAGNOSIS — I255 Ischemic cardiomyopathy: Secondary | ICD-10-CM | POA: Diagnosis not present

## 2017-08-21 HISTORY — PX: INSERTION OF MESH: SHX5868

## 2017-08-21 HISTORY — PX: INGUINAL HERNIA REPAIR: SHX194

## 2017-08-21 LAB — PROTIME-INR
INR: 1.42
PROTHROMBIN TIME: 17.2 s — AB (ref 11.4–15.2)

## 2017-08-21 SURGERY — REPAIR, HERNIA, INGUINAL, LAPAROSCOPIC
Anesthesia: General | Site: Groin | Laterality: Right

## 2017-08-21 MED ORDER — LACTATED RINGERS IV SOLN
INTRAVENOUS | Status: DC
  Administered 2017-08-21 (×2): via INTRAVENOUS

## 2017-08-21 MED ORDER — PROPOFOL 10 MG/ML IV BOLUS
INTRAVENOUS | Status: AC
  Filled 2017-08-21: qty 20

## 2017-08-21 MED ORDER — ACETAMINOPHEN 650 MG RE SUPP: 650.0000 mg | RECTAL | Status: DC | PRN

## 2017-08-21 MED ORDER — DEXAMETHASONE SODIUM PHOSPHATE 10 MG/ML IJ SOLN
INTRAMUSCULAR | Status: DC | PRN
  Administered 2017-08-21: 10 mg via INTRAVENOUS

## 2017-08-21 MED ORDER — FENTANYL CITRATE (PF) 100 MCG/2ML IJ SOLN
25.0000 ug | INTRAMUSCULAR | Status: DC | PRN
  Administered 2017-08-21 (×2): 50 ug via INTRAVENOUS

## 2017-08-21 MED ORDER — ONDANSETRON 4 MG PO TBDP
8.0000 mg | ORAL_TABLET | Freq: Once | ORAL | Status: AC
  Administered 2017-08-21: 8 mg via ORAL
  Filled 2017-08-21: qty 2

## 2017-08-21 MED ORDER — FENTANYL CITRATE (PF) 250 MCG/5ML IJ SOLN
INTRAMUSCULAR | Status: AC
  Filled 2017-08-21: qty 5

## 2017-08-21 MED ORDER — CHLORHEXIDINE GLUCONATE CLOTH 2 % EX PADS: 6.0000 | MEDICATED_PAD | Freq: Once | CUTANEOUS | Status: DC

## 2017-08-21 MED ORDER — MIDAZOLAM HCL 2 MG/2ML IJ SOLN
INTRAMUSCULAR | Status: AC
  Filled 2017-08-21: qty 2

## 2017-08-21 MED ORDER — ROCURONIUM BROMIDE 10 MG/ML (PF) SYRINGE
PREFILLED_SYRINGE | INTRAVENOUS | Status: AC
  Filled 2017-08-21: qty 5

## 2017-08-21 MED ORDER — BUPIVACAINE HCL (PF) 0.25 % IJ SOLN
INTRAMUSCULAR | Status: AC
  Filled 2017-08-21: qty 30

## 2017-08-21 MED ORDER — LIDOCAINE 2% (20 MG/ML) 5 ML SYRINGE
INTRAMUSCULAR | Status: AC
  Filled 2017-08-21: qty 5

## 2017-08-21 MED ORDER — OXYCODONE-ACETAMINOPHEN 5-325 MG PO TABS: 1.0000 | ORAL_TABLET | ORAL | 0 refills | Status: DC | PRN

## 2017-08-21 MED ORDER — SUGAMMADEX SODIUM 200 MG/2ML IV SOLN
INTRAVENOUS | Status: AC
  Filled 2017-08-21: qty 2

## 2017-08-21 MED ORDER — CEFAZOLIN SODIUM-DEXTROSE 2-4 GM/100ML-% IV SOLN
INTRAVENOUS | Status: AC
  Filled 2017-08-21: qty 100

## 2017-08-21 MED ORDER — FENTANYL CITRATE (PF) 250 MCG/5ML IJ SOLN
INTRAMUSCULAR | Status: DC | PRN
  Administered 2017-08-21 (×2): 50 ug via INTRAVENOUS

## 2017-08-21 MED ORDER — MORPHINE SULFATE (PF) 2 MG/ML IV SOLN: 2.0000 mg | INTRAVENOUS | Status: DC | PRN

## 2017-08-21 MED ORDER — LIDOCAINE-EPINEPHRINE (PF) 2 %-1:200000 IJ SOLN
10.0000 mL | Freq: Once | INTRAMUSCULAR | Status: AC
  Administered 2017-08-21: 10 mL
  Filled 2017-08-21: qty 20

## 2017-08-21 MED ORDER — 0.9 % SODIUM CHLORIDE (POUR BTL) OPTIME
TOPICAL | Status: DC | PRN
  Administered 2017-08-21: 1000 mL

## 2017-08-21 MED ORDER — CEFAZOLIN SODIUM-DEXTROSE 2-4 GM/100ML-% IV SOLN
2.0000 g | INTRAVENOUS | Status: AC
  Administered 2017-08-21: 2 g via INTRAVENOUS

## 2017-08-21 MED ORDER — PROPOFOL 10 MG/ML IV BOLUS
INTRAVENOUS | Status: DC | PRN
  Administered 2017-08-21: 120 mg via INTRAVENOUS

## 2017-08-21 MED ORDER — FENTANYL CITRATE (PF) 100 MCG/2ML IJ SOLN
INTRAMUSCULAR | Status: AC
  Filled 2017-08-21: qty 2

## 2017-08-21 MED ORDER — DEXAMETHASONE SODIUM PHOSPHATE 10 MG/ML IJ SOLN
INTRAMUSCULAR | Status: AC
  Filled 2017-08-21: qty 1

## 2017-08-21 MED ORDER — SODIUM CHLORIDE 0.9% FLUSH: 3.0000 mL | INTRAVENOUS | Status: DC | PRN

## 2017-08-21 MED ORDER — OXYCODONE HCL 5 MG PO TABS
ORAL_TABLET | ORAL | Status: AC
  Administered 2017-08-21: 10 mg via ORAL
  Filled 2017-08-21: qty 2

## 2017-08-21 MED ORDER — ONDANSETRON HCL 4 MG/2ML IJ SOLN
INTRAMUSCULAR | Status: AC
  Filled 2017-08-21: qty 2

## 2017-08-21 MED ORDER — OXYCODONE HCL 5 MG PO TABS
5.0000 mg | ORAL_TABLET | ORAL | Status: DC | PRN
  Administered 2017-08-21: 10 mg via ORAL

## 2017-08-21 MED ORDER — BUPIVACAINE HCL 0.25 % IJ SOLN
INTRAMUSCULAR | Status: DC | PRN
  Administered 2017-08-21: 4 mL

## 2017-08-21 MED ORDER — SODIUM CHLORIDE 0.9% FLUSH: 3.0000 mL | Freq: Two times a day (BID) | INTRAVENOUS | Status: DC

## 2017-08-21 MED ORDER — ROCURONIUM BROMIDE 10 MG/ML (PF) SYRINGE
PREFILLED_SYRINGE | INTRAVENOUS | Status: DC | PRN
  Administered 2017-08-21: 40 mg via INTRAVENOUS
  Administered 2017-08-21: 20 mg via INTRAVENOUS

## 2017-08-21 MED ORDER — ETOMIDATE 2 MG/ML IV SOLN
INTRAVENOUS | Status: AC
  Filled 2017-08-21: qty 10

## 2017-08-21 MED ORDER — ONDANSETRON HCL 4 MG/2ML IJ SOLN
INTRAMUSCULAR | Status: DC | PRN
  Administered 2017-08-21: 4 mg via INTRAVENOUS

## 2017-08-21 MED ORDER — SUGAMMADEX SODIUM 200 MG/2ML IV SOLN
INTRAVENOUS | Status: DC | PRN
  Administered 2017-08-21: 165 mg via INTRAVENOUS

## 2017-08-21 MED ORDER — SODIUM CHLORIDE 0.9 % IV SOLN: 250.0000 mL | INTRAVENOUS | Status: DC | PRN

## 2017-08-21 MED ORDER — ACETAMINOPHEN 325 MG PO TABS: 650.0000 mg | ORAL_TABLET | ORAL | Status: DC | PRN

## 2017-08-21 MED ORDER — LIDOCAINE 2% (20 MG/ML) 5 ML SYRINGE
INTRAMUSCULAR | Status: DC | PRN
  Administered 2017-08-21: 80 mg via INTRAVENOUS

## 2017-08-21 SURGICAL SUPPLY — 38 items
BENZOIN TINCTURE PRP APPL 2/3 (GAUZE/BANDAGES/DRESSINGS) ×3 IMPLANT
CANISTER SUCT 3000ML PPV (MISCELLANEOUS) ×3 IMPLANT
CHLORAPREP W/TINT 26ML (MISCELLANEOUS) ×3 IMPLANT
COVER SURGICAL LIGHT HANDLE (MISCELLANEOUS) ×3 IMPLANT
DISSECTOR BLUNT TIP ENDO 5MM (MISCELLANEOUS) IMPLANT
ELECT REM PT RETURN 9FT ADLT (ELECTROSURGICAL) ×3
ELECTRODE REM PT RTRN 9FT ADLT (ELECTROSURGICAL) ×2 IMPLANT
GAUZE SPONGE 2X2 8PLY STRL LF (GAUZE/BANDAGES/DRESSINGS) ×2 IMPLANT
GLOVE BIO SURGEON STRL SZ7.5 (GLOVE) ×3 IMPLANT
GOWN STRL REUS W/ TWL LRG LVL3 (GOWN DISPOSABLE) ×4 IMPLANT
GOWN STRL REUS W/ TWL XL LVL3 (GOWN DISPOSABLE) ×2 IMPLANT
GOWN STRL REUS W/TWL LRG LVL3 (GOWN DISPOSABLE) ×2
GOWN STRL REUS W/TWL XL LVL3 (GOWN DISPOSABLE) ×1
KIT BASIN OR (CUSTOM PROCEDURE TRAY) ×3 IMPLANT
KIT ROOM TURNOVER OR (KITS) ×3 IMPLANT
MESH 3DMAX 5X7 RT XLRG (Mesh General) ×3 IMPLANT
NS IRRIG 1000ML POUR BTL (IV SOLUTION) ×3 IMPLANT
PAD ARMBOARD 7.5X6 YLW CONV (MISCELLANEOUS) ×6 IMPLANT
RELOAD STAPLE HERNIA 4.0 BLUE (INSTRUMENTS) ×3 IMPLANT
RELOAD STAPLE HERNIA 4.8 BLK (STAPLE) IMPLANT
SCISSORS LAP 5X35 DISP (ENDOMECHANICALS) ×3 IMPLANT
SET IRRIG TUBING LAPAROSCOPIC (IRRIGATION / IRRIGATOR) IMPLANT
SET TROCAR LAP APPLE-HUNT 5MM (ENDOMECHANICALS) ×3 IMPLANT
SPONGE GAUZE 2X2 STER 10/PKG (GAUZE/BANDAGES/DRESSINGS) ×1
STAPLER HERNIA 12 8.5 360D (INSTRUMENTS) ×3 IMPLANT
STRIP CLOSURE SKIN 1/2X4 (GAUZE/BANDAGES/DRESSINGS) ×3 IMPLANT
SUT MNCRL AB 4-0 PS2 18 (SUTURE) ×3 IMPLANT
SUT VIC AB 1 CT1 27 (SUTURE)
SUT VIC AB 1 CT1 27XBRD ANBCTR (SUTURE) IMPLANT
SYRINGE TOOMEY DISP (SYRINGE) ×3 IMPLANT
TAPE CLOTH SURG 4X10 WHT LF (GAUZE/BANDAGES/DRESSINGS) ×3 IMPLANT
TOWEL OR 17X24 6PK STRL BLUE (TOWEL DISPOSABLE) ×3 IMPLANT
TOWEL OR 17X26 10 PK STRL BLUE (TOWEL DISPOSABLE) ×3 IMPLANT
TRAY FOLEY CATH SILVER 14FR (SET/KITS/TRAYS/PACK) ×3 IMPLANT
TRAY LAPAROSCOPIC MC (CUSTOM PROCEDURE TRAY) ×3 IMPLANT
TROCAR XCEL 12X100 BLDLESS (ENDOMECHANICALS) ×3 IMPLANT
TUBING INSUFFLATION (TUBING) ×3 IMPLANT
WATER STERILE IRR 1000ML POUR (IV SOLUTION) ×3 IMPLANT

## 2017-08-21 NOTE — Discharge Instructions (Signed)
[facility] _______[facility] °INGUINAL HERNIA REPAIR: POST OP INSTRUCTIONS ° °Always review your discharge instruction sheet given to you by the facility where your surgery was performed. °IF YOU HAVE DISABILITY OR FAMILY LEAVE FORMS, YOU MUST BRING THEM TO THE OFFICE FOR PROCESSING.   °DO NOT GIVE THEM TO YOUR DOCTOR. ° °1. A  prescription for pain medication may be given to you upon discharge.  Take your pain medication as prescribed, if needed.  If narcotic pain medicine is not needed, then you may take acetaminophen (Tylenol) or ibuprofen (Advil) as needed. °2. Take your usually prescribed medications unless otherwise directed. °If you need a refill on your pain medication, please contact your pharmacy.  They will contact our office to request authorization. Prescriptions will not be filled after 5 pm or on week-ends. °3. You should follow a light diet the first 24 hours after arrival home, such as soup and crackers, etc.  Be sure to include lots of fluids daily.  Resume your normal diet the day after surgery. °4.Most patients will experience some swelling and bruising around the umbilicus or in the groin and scrotum.  Ice packs and reclining will help.  Swelling and bruising can take several days to resolve.  °6. It is common to experience some constipation if taking pain medication after surgery.  Increasing fluid intake and taking a stool softener (such as Colace) will usually help or prevent this problem from occurring.  A mild laxative (Milk of Magnesia or Miralax) should be taken according to package directions if there are no bowel movements after 48 hours. °7. Unless discharge instructions indicate otherwise, you may remove your bandages 24-48 hours after surgery, and you may shower at that time.  You may have steri-strips (small skin tapes) in place directly over the incision.  These strips should be left on the skin for 7-10 days.  If your surgeon used skin glue on the incision, you may  shower in 24 hours.  The glue will flake off over the next 2-3 weeks.  Any sutures or staples will be removed at the office during your follow-up visit. °8. ACTIVITIES:  You may resume regular (light) daily activities beginning the next day--such as daily self-care, walking, climbing stairs--gradually increasing activities as tolerated.  You may have sexual intercourse when it is comfortable.  Refrain from any heavy lifting or straining until approved by your doctor. ° °a.You may drive when you are no longer taking prescription pain medication, you can comfortably wear a seatbelt, and you can safely maneuver your car and apply brakes. °b.RETURN TO WORK:   °_____________________________________________ ° °9.You should see your doctor in the office for a follow-up appointment approximately 2-3 weeks after your surgery.  Make sure that you call for this appointment within a day or two after you arrive home to insure a convenient appointment time. °10.OTHER INSTRUCTIONS: _________________________ °   _____________________________________ ° °WHEN TO CALL YOUR DOCTOR: °1. Fever over 101.0 °2. Inability to urinate °3. Nausea and/or vomiting °4. Extreme swelling or bruising °5. Continued bleeding from incision. °6. Increased pain, redness, or drainage from the incision ° °The clinic staff is available to answer your questions during regular business hours.  Please don’t hesitate to call and ask to speak to one of the nurses for clinical concerns.  If you have a medical emergency, go to the nearest emergency room or call 911.  A surgeon from [facility] is always on call at the hospital ° ° °[address], [address], [place], [place]  [address] ? ° [address] [address], [place],    [address] °[phone] ? [phone] ? FAX [phone] °Web site: [email or web address] ° °

## 2017-08-21 NOTE — Discharge Instructions (Signed)
Pack wound as instructed by Dr. Rosendo Gros. Do not take your coumadin tonight. Follow up as instructed.

## 2017-08-21 NOTE — Anesthesia Preprocedure Evaluation (Signed)
Anesthesia Evaluation  Patient identified by MRN, date of birth, ID band Patient awake    Reviewed: Allergy & Precautions, NPO status , Patient's Chart, lab work & pertinent test results  Airway Mallampati: II  TM Distance: >3 FB     Dental   Pulmonary former smoker,    breath sounds clear to auscultation       Cardiovascular hypertension, + CAD, + Past MI and + Peripheral Vascular Disease   Rhythm:Regular Rate:Normal     Neuro/Psych    GI/Hepatic negative GI ROS, Neg liver ROS,   Endo/Other  Hypothyroidism   Renal/GU negative Renal ROS     Musculoskeletal  (+) Arthritis ,   Abdominal   Peds  Hematology   Anesthesia Other Findings   Reproductive/Obstetrics                             Anesthesia Physical Anesthesia Plan  ASA: III  Anesthesia Plan: General   Post-op Pain Management:    Induction: Intravenous  PONV Risk Score and Plan: 2 and Treatment may vary due to age or medical condition, Ondansetron and Dexamethasone  Airway Management Planned: Oral ETT  Additional Equipment:   Intra-op Plan:   Post-operative Plan: Extubation in OR  Informed Consent: I have reviewed the patients History and Physical, chart, labs and discussed the procedure including the risks, benefits and alternatives for the proposed anesthesia with the patient or authorized representative who has indicated his/her understanding and acceptance.   Dental advisory given  Plan Discussed with: CRNA, Anesthesiologist and Surgeon  Anesthesia Plan Comments:         Anesthesia Quick Evaluation

## 2017-08-21 NOTE — Anesthesia Procedure Notes (Signed)
Procedure Name: Intubation Date/Time: 08/21/2017 9:46 AM Performed by: Freddie Breech, CRNA Pre-anesthesia Checklist: Patient identified, Emergency Drugs available, Suction available and Patient being monitored Patient Re-evaluated:Patient Re-evaluated prior to induction Oxygen Delivery Method: Circle System Utilized Preoxygenation: Pre-oxygenation with 100% oxygen Induction Type: IV induction Ventilation: Mask ventilation without difficulty Laryngoscope Size: Mac and 4 Grade View: Grade II Tube type: Oral Tube size: 7.5 mm Number of attempts: 1 Airway Equipment and Method: Stylet and Oral airway Placement Confirmation: ETT inserted through vocal cords under direct vision,  positive ETCO2 and breath sounds checked- equal and bilateral Secured at: 23 cm Tube secured with: Tape Dental Injury: Teeth and Oropharynx as per pre-operative assessment

## 2017-08-21 NOTE — Interval H&P Note (Signed)
History and Physical Interval Note:  08/21/2017 9:04 AM  Gregory Blankenship.  has presented today for surgery, with the diagnosis of right inguinal hernia  The various methods of treatment have been discussed with the patient and family. After consideration of risks, benefits and other options for treatment, the patient has consented to  Procedure(s): LAPAROSCOPIC RIGHT  INGUINAL HERNIA (Right) INSERTION OF MESH (Right) as a surgical intervention .  The patient's history has been reviewed, patient examined, no change in status, stable for surgery.  I have reviewed the patient's chart and labs.  Questions were answered to the patient's satisfaction.     Rosario Jacks., Anne Hahn

## 2017-08-21 NOTE — ED Provider Notes (Signed)
[facility] EMERGENCY DEPARTMENT Provider Note   CSN: [id number] Arrival date & time: 08/21/17  1939     History   Chief Complaint Chief Complaint  Patient presents with  . Post-op Problem    HPI Orion Mole. is a 76 y.o. male.  HPI Renan R Jaykwon Morones. is a 76 y.o. male presents to emergency department with complaint of postoperative bleeding.  Patient had right inguinal hernia repaired this morning by Dr. Rosendo Gros.  He states since he has been home, he has had persistent bleeding from his umbilical incision.  He states he is unable to stop it with pressure.  He did contact general surgery and spoke with Dr. Vergia Alberts who advised him to apply ice pack and direct pressure for 30 minutes.  Patient states he did that but it did not help his bleeding.  Patient was anticoagulated prior to surgery but was taken off of warfarin 5 days prior.  He denies any other complaints.  Denies pain.  Past Medical History:  Diagnosis Date  . Arthritis    back  . Blood dyscrasia   . Cancer [facility])    prostate surgery- robotic surgery   . Coronary artery disease   . Hyperlipidemia   . Hypertension    patient denies  . Hypothyroid   . Myocardial infarction ([place]) 2014  . Peripheral vascular disease (HCC)    phlebitis  . Polycythemia   . Pulmonary embolism ([place])   . Varicose veins     Patient Active Problem List   Diagnosis Date Noted  . Long term (current) use of anticoagulants [Z79.01] 05/01/2017  . Varicose veins of lower extremities with other complications 40/98/1191  . Essential hypertension 08/22/2013  . Hyperlipidemia 08/22/2013  . STEMI (ST elevation myocardial infarction) ([place]) 12/02/2011  . Pulmonary embolism, 9/11 after prostate surgery 12/02/2011  . Chronic anticoagulation 12/02/2011  . Leukocytosis 12/02/2011  . Hypothyroid, treated 12/02/2011  . CAD, chronically occluded LAD at cath, (unable to place stent) 12/02/2011  . Cardiomyopathy, ischemic, EF  35-40% by 2D 2/26 12/02/2011    Past Surgical History:  Procedure Laterality Date  . CARDIAC CATHETERIZATION  2014   not able to do stent   . PROSTATE SURGERY  2011  . VASCULAR SURGERY     vericose veins in legs        Home Medications    Prior to Admission medications   Medication Sig Start Date End Date Taking? Authorizing Provider  acetaminophen (TYLENOL) 325 MG tablet Take 325 mg by mouth every 6 (six) hours as needed for mild pain.     [provider]  aspirin 81 MG tablet Take 81 mg by mouth every evening.     [provider]  carvedilol (COREG) 6.25 MG tablet TAKE 1 TABLET BY MOUTH TWICE DAILY 09/06/16   Lorretta Harp, MD  enoxaparin (LOVENOX) 80 MG/0.8ML injection Inject 80 mg into the skin. Bridge to surgery from coumadin    [provider]  isosorbide mononitrate (IMDUR) 30 MG 24 hr tablet TAKE 1 TABLET BY MOUTH ONCE DAILY 09/06/16   Lorretta Harp, MD  levothyroxine (SYNTHROID, LEVOTHROID) 88 MCG tablet TAKE 88 MCG BY MOUTH EVERY MORNING 04/16/17   [provider]  NITROSTAT 0.4 MG SL tablet PLACE 1 TABELT UNDER THE TONGUE EVERY 5 MINUTES AS NEEDED FOR CHEST PAIN. 08/05/14   Lorretta Harp, MD  oxyCODONE-acetaminophen (ROXICET) 5-325 MG tablet Take 1 tablet every 4 (four) hours as needed by  mouth for severe pain. 08/21/17   Ralene Ok, MD  simvastatin (ZOCOR) 20 MG tablet Take 20 mg by mouth every evening. Managed by PCP, Dr. Delfina Redwood    [provider]  warfarin (COUMADIN) 4 MG tablet Take 4-5 mg by mouth See admin instructions. Take 5 mg by mouth daily in the evening on Monday, Wednesday and Friday. Take 4 mg by mouth daily in the evening on all other days    [provider]    Family History Family History  Problem Relation Age of Onset  . Varicose Veins Mother   . Cancer Mother   . Cancer Father   . Coronary artery disease Father        cabg x5  . Stroke Maternal Grandfather   . Cancer Brother      Social History Social History   Tobacco Use  . Smoking status: Former Smoker    Last attempt to quit: 12/01/1968    Years since quitting: 48.7  . Smokeless tobacco: Never Used  Substance Use Topics  . Alcohol use: No    Comment: hx of 5-6 years ago an occasional beer or wine  . Drug use: No     Allergies   Erythromycin   Review of Systems Review of Systems  Constitutional: Negative for chills and fever.  Neurological: Negative for dizziness and light-headedness.     Physical Exam Updated Vital Signs There were no vitals taken for this visit.  Physical Exam  Constitutional: He appears well-developed and well-nourished. No distress.  HENT:  Head: Normocephalic and atraumatic.  Eyes: Conjunctivae are normal.  Neck: Neck supple.  Cardiovascular: Normal rate, regular rhythm and normal heart sounds.  Pulmonary/Chest: Effort normal. No respiratory distress. He has no wheezes. He has no rales.  Abdominal: Soft. Bowel sounds are normal. He exhibits no distension. There is no tenderness. There is no rebound.  Incision just below the umbilicus, appears to be oozing with blood.  Palpable hematoma present.  Musculoskeletal: He exhibits no edema.  Neurological: He is alert.  Skin: Skin is warm and dry.  Nursing note and vitals reviewed.    ED Treatments / Results  Labs (all labs ordered are listed, but only abnormal results are displayed) Labs Reviewed - No data to display  EKG  EKG Interpretation None       Radiology No results found.  Procedures Procedures (including critical care time)  Medications Ordered in ED Medications - No data to display   Initial Impression / Assessment and Plan / ED Course  I have reviewed the triage vital signs and the nursing notes.  Pertinent labs & imaging results that were available during my care of the patient were reviewed by me and considered in my medical decision making (see chart for details).     8:30  PM Spoke with Dr. Rosendo Gros, who advised to apply pressure dressing and an ice pack.  Pressure dressing with an ice pack applied.  Will monitor.  Will reassess.  10:58 PM Rosendo Gros has seen the patient and wound was opened up and packed.  No longer bleeding at this time.  Will discharge home, follow-up as instructed.  Return precautions discussed. Pt feels well and agree to dc home. VS stable.   Vitals:   08/21/17 2200 08/21/17 2215 08/21/17 2230 08/21/17 2245  BP: 138/90 133/88 (!) 150/91 (!) 153/97  Pulse: 77 72 73 78  Resp: 17 16 19 17   SpO2: 96% 96% 96% 94%    Final Clinical Impressions(s) /  ED Diagnoses   Final diagnoses:  Encounter for post surgical wound check  Problem involving surgical incision    ED Discharge Orders    None       Jeannett Senior, PA-C 08/22/17 0932    Fatima Blank, MD 08/22/17 0028

## 2017-08-21 NOTE — Anesthesia Postprocedure Evaluation (Signed)
Anesthesia Post Note  Patient: Gregory Blankenship.  Procedure(s) Performed: LAPAROSCOPIC RIGHT  INGUINAL HERNIA  REPAIR WITH MESH (Right Groin) INSERTION OF MESH (Right )     Patient location during evaluation: PACU Anesthesia Type: General Level of consciousness: awake Pain management: pain level controlled Vital Signs Assessment: post-procedure vital signs reviewed and stable Respiratory status: spontaneous breathing Cardiovascular status: stable Anesthetic complications: no    Last Vitals:  Vitals:   08/21/17 1052 08/21/17 1054  BP:    Pulse:    Resp:    Temp: 36.6 C 36.4 C  SpO2:      Last Pain:  Vitals:   08/21/17 1104  TempSrc:   PainSc: 4                  Aneshia Jacquet

## 2017-08-21 NOTE — ED Notes (Signed)
Pt dressing to surgical site reassessed with PA after 30 minutes of pressure and ice. Incision still appears to have steady ooze. New pressure dressing applied at this time. Pt NAD. VSS

## 2017-08-21 NOTE — Telephone Encounter (Signed)
He called at 1815 reporting that he had a laparoscopic right inguinal hernia repair with mesh today.  He had some bleeding on the umbilical bandage postop and it was reinforced with gauze in the PACU.  Since he has been home, he has had to reinforce that bandage again because of blood soaking through the originally reinforced bandage.  I advised him to place an ice pack over the area and hold direct pressure for 30 minutes.  If he was still bleeding after that, I advised him to go to the ED.

## 2017-08-21 NOTE — Op Note (Signed)
08/21/2017  10:42 AM  PATIENT:  [name].  76 y.o. male  PRE-OPERATIVE DIAGNOSIS:  right inguinal hernia  POST-OPERATIVE DIAGNOSIS: Large right indirect inguinal hernia  PROCEDURE:  Procedure(s): LAPAROSCOPIC RIGHT  INGUINAL HERNIA  REPAIR WITH MESH (Right) INSERTION OF MESH (Right)  SURGEON:  Surgeon(s) and Role:    Ralene Ok, MD - Primary  ANESTHESIA:   local and general  EBL:  minimal   BLOOD ADMINISTERED:none  DRAINS: none   LOCAL MEDICATIONS USED:  BUPIVICAINE   SPECIMEN:  No Specimen  DISPOSITION OF SPECIMEN:  N/A  COUNTS:  YES  TOURNIQUET:  * No tourniquets in log *  DICTATION: .Dragon Dictation    Counts: reported as correct x 2  Findings:  The patient had a large right indirect hernia  Indications for procedure:  The patient is a 76 year old male with a right hernia for several months. Patient complained of symptomatology to his right inguinal area. The patient was taken back for elective inguinal hernia repair.  Details of the procedure: The patient was taken back to the operating room. The patient was placed in supine position with bilateral SCDs in place.  The patient was prepped and draped in the usual sterile fashion.  After appropriate anitbiotics were confirmed, a time-out was confirmed and all facts were verified.  0.25% Marcaine was used to infiltrate the umbilical area. A 11-blade was used to cut down the skin and blunt dissection was used to get the anterior fashion.  The anterior fascia was incised approximately 1 cm and the muscles were retracted laterally. Blunt dissection was then used to create a space in the preperitoneal area. At this time a 10 mm camera was then introduced into the space and advanced the pubic tubercle and a 12 mm trocar was placed over this and insufflation was started.  At this time and space was created from medial to laterally the preperitoneal space.  Cooper's ligament was initially cleaned off.  The  hernia sac was identified in the indirect space. Dissection of the hernia sac was undertaken the vas deferens was identified and protected in all parts of the case.    Once the hernia sac was taken down to approximately the umbilicus a Bard 3D Max mesh, size: Rachelle Hora, was  introduced into the preperitoneal space.  The mesh was brought over to cover the direct and indirect hernia spaces.  This was anchored into place and secured to Cooper's ligament with 4.59mm staples from a [place] hernia stapler. It was anchored to the anterior abdominal wall with 4.8 mm staples. The hernia sac was seen lying posterior to the mesh. There was no staples placed laterally. The insufflation was evacuated and the peritoneum was seen posterior to the mesh. The trochars were removed. The anterior fascia was reapproximated using #1 Vicryl on a UR- 6.  Intra-abdominal air was evacuated and the Veress needle removed. The skin was reapproximated using 4-0 Monocryl subcuticular fashion the patient was awakened from general anesthesia and taken to recovery in stable condition.   PLAN OF CARE: Discharge to home after PACU  PATIENT DISPOSITION:  PACU - hemodynamically stable.   Delay start of Pharmacological VTE agent (>24hrs) due to surgical blood loss or risk of bleeding: not applicable

## 2017-08-21 NOTE — ED Triage Notes (Signed)
Pt just had a hernia repair done today by Dr. Rosendo Gros and was sent home around 1400 today since then pt is not stopping bleeding from his incision, he applied ice pad as ordered by surgeon with not stop on his bleeding, pt is not on pain but is concern about the bleeding. PA notified and Dr. Rosendo Gros to be consulted

## 2017-08-21 NOTE — Transfer of Care (Signed)
Immediate Anesthesia Transfer of Care Note  Patient: Gregory Blankenship.  Procedure(s) Performed: LAPAROSCOPIC RIGHT  INGUINAL HERNIA  REPAIR WITH MESH (Right Groin) INSERTION OF MESH (Right )  Patient Location: PACU  Anesthesia Type:General  Level of Consciousness: drowsy and patient cooperative  Airway & Oxygen Therapy: Patient Spontanous Breathing and Patient connected to face mask oxygen  Post-op Assessment: Report given to RN and Post -op Vital signs reviewed and stable  Post vital signs: Reviewed and stable  Last Vitals:  Vitals:   08/21/17 1052 08/21/17 1054  BP:    Pulse:    Resp:    Temp: (P) 36.6 C 36.4 C  SpO2:      Last Pain:  Vitals:   08/21/17 1054  TempSrc: Tympanic      Patients Stated Pain Goal: (P) 0 (29/93/71 6967)  Complications: No apparent anesthesia complications

## 2017-08-22 ENCOUNTER — Encounter (HOSPITAL_COMMUNITY): Payer: Self-pay | Admitting: General Surgery

## 2017-08-25 ENCOUNTER — Other Ambulatory Visit: Payer: Self-pay | Admitting: Surgery

## 2017-08-25 MED ORDER — CEPHALEXIN 250 MG PO CAPS: 250.0000 mg | ORAL_CAPSULE | Freq: Four times a day (QID) | ORAL | 0 refills | Status: AC

## 2017-08-27 ENCOUNTER — Other Ambulatory Visit: Payer: Self-pay | Admitting: Internal Medicine

## 2017-08-27 ENCOUNTER — Ambulatory Visit
Admission: RE | Admit: 2017-08-27 | Discharge: 2017-08-27 | Disposition: A | Discharge status: Home / Self Care | Payer: Medicare Other | Source: Ambulatory Visit | Attending: Internal Medicine | Admitting: Internal Medicine

## 2017-08-27 DIAGNOSIS — R42 Dizziness and giddiness: Secondary | ICD-10-CM | POA: Diagnosis not present

## 2017-08-27 DIAGNOSIS — Z7901 Long term (current) use of anticoagulants: Secondary | ICD-10-CM | POA: Diagnosis not present

## 2017-08-27 DIAGNOSIS — R200 Anesthesia of skin: Secondary | ICD-10-CM | POA: Diagnosis not present

## 2017-08-28 ENCOUNTER — Ambulatory Visit: Payer: Medicare Other | Admitting: Cardiovascular Disease

## 2017-08-28 ENCOUNTER — Encounter: Payer: Self-pay | Admitting: *Deleted

## 2017-08-28 ENCOUNTER — Ambulatory Visit
Admission: RE | Admit: 2017-08-28 | Discharge: 2017-08-28 | Disposition: A | Discharge status: Home / Self Care | Payer: Medicare Other | Source: Ambulatory Visit | Attending: Internal Medicine | Admitting: Internal Medicine

## 2017-08-28 DIAGNOSIS — R42 Dizziness and giddiness: Secondary | ICD-10-CM

## 2017-08-28 DIAGNOSIS — I6523 Occlusion and stenosis of bilateral carotid arteries: Secondary | ICD-10-CM | POA: Diagnosis not present

## 2017-08-29 ENCOUNTER — Other Ambulatory Visit: Payer: Self-pay | Admitting: Internal Medicine

## 2017-08-29 DIAGNOSIS — G459 Transient cerebral ischemic attack, unspecified: Secondary | ICD-10-CM

## 2017-09-04 ENCOUNTER — Ambulatory Visit
Admission: RE | Admit: 2017-09-04 | Discharge: 2017-09-04 | Disposition: A | Discharge status: Home / Self Care | Payer: Medicare Other | Source: Ambulatory Visit | Attending: Internal Medicine | Admitting: Internal Medicine

## 2017-09-04 DIAGNOSIS — G459 Transient cerebral ischemic attack, unspecified: Secondary | ICD-10-CM

## 2017-09-04 DIAGNOSIS — Z7901 Long term (current) use of anticoagulants: Secondary | ICD-10-CM | POA: Diagnosis not present

## 2017-09-06 DIAGNOSIS — D45 Polycythemia vera: Secondary | ICD-10-CM | POA: Diagnosis not present

## 2017-09-06 DIAGNOSIS — D485 Neoplasm of uncertain behavior of skin: Secondary | ICD-10-CM | POA: Diagnosis not present

## 2017-09-06 DIAGNOSIS — L430 Hypertrophic lichen planus: Secondary | ICD-10-CM | POA: Diagnosis not present

## 2017-09-06 DIAGNOSIS — Z85828 Personal history of other malignant neoplasm of skin: Secondary | ICD-10-CM | POA: Diagnosis not present

## 2017-09-06 DIAGNOSIS — Z85820 Personal history of malignant melanoma of skin: Secondary | ICD-10-CM | POA: Diagnosis not present

## 2017-09-06 DIAGNOSIS — L821 Other seborrheic keratosis: Secondary | ICD-10-CM | POA: Diagnosis not present

## 2017-09-06 DIAGNOSIS — G459 Transient cerebral ischemic attack, unspecified: Secondary | ICD-10-CM | POA: Diagnosis not present

## 2017-09-06 DIAGNOSIS — R9089 Other abnormal findings on diagnostic imaging of central nervous system: Secondary | ICD-10-CM | POA: Diagnosis not present

## 2017-09-08 ENCOUNTER — Other Ambulatory Visit: Payer: Medicare Other

## 2017-09-09 ENCOUNTER — Other Ambulatory Visit: Payer: Self-pay | Admitting: Cardiovascular Disease

## 2017-09-13 ENCOUNTER — Other Ambulatory Visit: Payer: Self-pay

## 2017-09-13 ENCOUNTER — Ambulatory Visit: Payer: Medicare Other | Admitting: Neurology

## 2017-09-13 ENCOUNTER — Encounter: Payer: Self-pay | Admitting: Neurology

## 2017-09-13 VITALS — BP 116/76 | HR 55 | Ht 71.0 in | Wt 178.0 lb

## 2017-09-13 DIAGNOSIS — I6312 Cerebral infarction due to embolism of basilar artery: Secondary | ICD-10-CM

## 2017-09-13 DIAGNOSIS — I2699 Other pulmonary embolism without acute cor pulmonale: Secondary | ICD-10-CM

## 2017-09-13 DIAGNOSIS — G3281 Cerebellar ataxia in diseases classified elsewhere: Secondary | ICD-10-CM

## 2017-09-13 DIAGNOSIS — Z7901 Long term (current) use of anticoagulants: Secondary | ICD-10-CM | POA: Diagnosis not present

## 2017-09-13 NOTE — Patient Instructions (Signed)
   We will check a 2D echo on the heart and get a CTA of the neck to look at the circulation to the head.

## 2017-09-13 NOTE — Progress Notes (Signed)
Reason for visit: Stroke  Referring physician: Dr. Melrose Nakayama. is a 76 y.o. male  History of present illness:  Gregory Blankenship is a 76 year old right-handed white male with a history of polycythemia vera followed through hematology, and a history of pulmonary emboli that occurred in 2011.  Gregory Blankenship has been on chronic Coumadin therapy since that time.  Around 21 August 2017 Gregory Blankenship went in for a hernia repair, Gregory Blankenship had come off of Gregory Coumadin and was placed on Lovenox for 5 days prior to surgery.  Gregory Blankenship did not go back on Lovenox after surgery, but went back on Coumadin.  Around 25 August 2017, Gregory Blankenship had a transient event of right arm numbness and a feeling of wooziness that lasted about 5 minutes and then cleared.  Gregory Blankenship went into see his primary doctor on 19 November, and INR at that time was 1.4.  Gregory Blankenship had a second event of losing this only about 3 days after Gregory first event.  This was not associated with any numbness or weakness.  Gregory Blankenship had no headache, vision changes, speech or swallowing problems, and no change in her cognitive capacity.  Gregory Blankenship denies any change in balance or difficulty controlling Gregory bowels or Gregory bladder.  Gregory Blankenship had been set up for MRI evaluation of Gregory brain which noted a small right occipital punctate acute infarct.  MRI angiogram of Gregory head was unremarkable.  Gregory Blankenship is on Coumadin and aspirin therapy.  Gregory Blankenship is sent to this office for an evaluation.  Past Medical History:  Diagnosis Date  . Arthritis    back  . Blood dyscrasia   . Cancer [facility])    prostate surgery- robotic surgery   . Coronary artery disease   . Hyperlipidemia   . Hypertension    Blankenship denies  . Hypothyroid   . Myocardial infarction ([place]) 2014  . Peripheral vascular disease (HCC)    phlebitis  . Polycythemia   . Pulmonary embolism ([place])   . Varicose veins     Past Surgical History:  Procedure Laterality Date  . CARDIAC CATHETERIZATION   2014   not able to do stent   . COLONOSCOPY WITH PROPOFOL N/A 05/16/2016   Procedure: COLONOSCOPY WITH PROPOFOL;  Surgeon: Garlan Fair, MD;  Location: [facility];  Service: Endoscopy;  Laterality: N/A;  . INGUINAL HERNIA REPAIR Right 08/21/2017   Procedure: LAPAROSCOPIC RIGHT  INGUINAL HERNIA  REPAIR WITH MESH;  Surgeon: Ralene Ok, MD;  Location: [facility];  Service: General;  Laterality: Right;  . INSERTION OF MESH Right 08/21/2017   Procedure: INSERTION OF MESH;  Surgeon: Ralene Ok, MD;  Location: [facility];  Service: General;  Laterality: Right;  . LEFT HEART CATHETERIZATION WITH CORONARY ANGIOGRAM N/A 12/02/2011   Procedure: LEFT HEART CATHETERIZATION WITH CORONARY ANGIOGRAM;  Surgeon: Lorretta Harp, MD;  Location: [facility] CATH LAB;  Service: Cardiovascular;  Laterality: N/A;  . PERCUTANEOUS CORONARY INTERVENTION-BALLOON ONLY  12/02/2011   Procedure: PERCUTANEOUS CORONARY INTERVENTION-BALLOON ONLY;  Surgeon: Lorretta Harp, MD;  Location: [facility] CATH LAB;  Service: Cardiovascular;;  Chronic total occlusion  . PROSTATE SURGERY  2011  . VASCULAR SURGERY     vericose veins in legs     Family History  Problem Relation Age of Onset  . Varicose Veins Mother   . Cancer Mother   . Cancer Father   . Coronary artery disease Father        cabg x5  .  Stroke Maternal Grandfather   . Cancer Brother     Social history:  reports that Gregory Blankenship quit smoking about 74 years ago. Gregory Blankenship has never used smokeless tobacco. Gregory Blankenship reports that Gregory Blankenship does not drink alcohol or use drugs.  Medications:  Prior to Admission medications   Medication Sig Start Date End Date Taking? Authorizing Provider  acetaminophen (TYLENOL) 325 MG tablet Take 325 mg by mouth every 6 (six) hours as needed for mild pain.    Yes [provider]  aspirin 81 MG tablet Take 81 mg by mouth every evening.    Yes [provider]  carvedilol (COREG) 6.25 MG tablet TAKE 1 TABLET BY MOUTH TWICE DAILY Blankenship taking differently:  Take 6.25 mg by mouth two times a day 09/06/16  Yes Lorretta Harp, MD  colchicine (COLCRYS) 0.6 MG tablet Take 0.6 mg daily by mouth.   Yes [provider]  fluorouracil (EFUDEX) 5 % cream Apply 1 application topically as needed. For 2 weeks during winter time 09/10/17  Yes [provider]  isosorbide mononitrate (IMDUR) 30 MG 24 hr tablet TAKE 1 TABLET BY MOUTH ONCE DAILY 09/10/17  Yes Lorretta Harp, MD  levothyroxine (SYNTHROID, LEVOTHROID) 88 MCG tablet Take 88 mcg by mouth in Gregory morning 04/16/17  Yes [provider]  NITROSTAT 0.4 MG SL tablet PLACE 1 TABELT UNDER Gregory TONGUE EVERY 5 MINUTES AS NEEDED FOR CHEST PAIN. Blankenship taking differently: Place 1 tablet (0.4 mg) under Gregory tongue every 5 minutes as needed for chest pain 08/05/14  Yes Lorretta Harp, MD  oxyCODONE-acetaminophen (ROXICET) 5-325 MG tablet Take 1 tablet every 4 (four) hours as needed by mouth for severe pain. 08/21/17  Yes Ralene Ok, MD  simvastatin (ZOCOR) 20 MG tablet Take 20 mg every evening by mouth.    Yes [provider]  warfarin (COUMADIN) 4 MG tablet Take 4 mg See admin instructions by mouth. 4 mg in Gregory evening on Sun/Tues/Thurs/Sat   Yes [provider]  warfarin (COUMADIN) 5 MG tablet Take 5 mg See admin instructions by mouth. In Gregory evening on Mon/Wed/Fri   Yes [provider]      Allergies  Allergen Reactions  . Erythromycin Nausea Only    ROS:  Out of a complete 14 system review of symptoms, Gregory Blankenship complains only of Gregory following symptoms, and all other reviewed systems are negative.  Numbness Dizziness  Blood pressure 116/76, pulse (!) 55, height 5\' 11"  (1.803 m), weight 178 lb (80.7 kg).  Physical Exam  General: Gregory Blankenship is alert and cooperative at Gregory time of Gregory examination.  Eyes: Pupils are equal, round, and reactive to light. Discs are flat bilaterally.  Neck: Gregory neck is supple, no carotid bruits are  noted.  Respiratory: Gregory respiratory examination is clear.  Cardiovascular: Gregory cardiovascular examination reveals a regular rate and rhythm, no obvious murmurs or rubs are noted.  Skin: Extremities are without significant edema.  Neurologic Exam  Mental status: Gregory Blankenship is alert and oriented x 3 at Gregory time of Gregory examination. Gregory Blankenship has apparent normal recent and remote memory, with an apparently normal attention span and concentration ability.  Cranial nerves: Facial symmetry is present. There is good sensation of Gregory face to pinprick and soft touch bilaterally. Gregory strength of Gregory facial muscles and Gregory muscles to head turning and shoulder shrug are normal bilaterally. Speech is well enunciated, no aphasia or dysarthria is noted. Extraocular movements are full. Visual fields are full.  Gregory tongue is midline, and Gregory Blankenship has symmetric elevation of Gregory soft palate. No obvious hearing deficits are noted.  Motor: Gregory motor testing reveals 5 over 5 strength of all 4 extremities. Good symmetric motor tone is noted throughout.  Sensory: Sensory testing is intact to pinprick, soft touch, vibration sensation, and position sense on all 4 extremities. No evidence of extinction is noted.  Coordination: Cerebellar testing reveals good finger-nose-finger and heel-to-shin bilaterally.  Gait and station: Gait is normal. Tandem gait is minimally unsteady. Romberg is negative. No drift is seen.  Reflexes: Deep tendon reflexes are symmetric and normal bilaterally. Toes are downgoing bilaterally.   MRI brain/ MRA head 09/04/17:  IMPRESSION: Possible small area of acute infarct right posterior parietal cortex measuring 5 mm. Alternately, this could be an artifact.  Mild chronic microvascular ischemia in Gregory white matter  Negative MRA head  * MRI scan images were reviewed online. I agree with Gregory written report.    Carotid doppler 08/28/17:  IMPRESSION: Less than 50% stenosis in  Gregory right and left internal carotid artery.   Assessment/Plan:  1.  Recent punctate stroke  2.  Polycythemia vera  3.  Chronic anticoagulation  4.  History of pulmonary embolism  Gregory Blankenship will be set up for a 2D echocardiogram and a CT angiogram of Gregory neck.  If Gregory studies are unremarkable, I would not alter medical therapy.  Gregory Blankenship does not report any palpitations of Gregory heart.  Gregory Blankenship will follow-up through this office on an as-needed basis.  Jill Alexanders MD 09/13/2017 9:39 AM  [facility] [address] [place], [place] [address]  Phone [phone] Fax [phone]

## 2017-09-14 ENCOUNTER — Other Ambulatory Visit: Payer: Self-pay | Admitting: Cardiovascular Disease

## 2017-09-20 ENCOUNTER — Telehealth: Payer: Self-pay | Admitting: Neurology

## 2017-09-20 ENCOUNTER — Ambulatory Visit (HOSPITAL_COMMUNITY): Payer: Medicare Other | Attending: Cardiovascular Disease

## 2017-09-20 ENCOUNTER — Ambulatory Visit
Admission: RE | Admit: 2017-09-20 | Discharge: 2017-09-20 | Disposition: A | Discharge status: Home / Self Care | Payer: Medicare Other | Source: Ambulatory Visit | Attending: Neurology | Admitting: Neurology

## 2017-09-20 ENCOUNTER — Other Ambulatory Visit: Payer: Self-pay

## 2017-09-20 DIAGNOSIS — I252 Old myocardial infarction: Secondary | ICD-10-CM | POA: Diagnosis not present

## 2017-09-20 DIAGNOSIS — R42 Dizziness and giddiness: Secondary | ICD-10-CM | POA: Diagnosis not present

## 2017-09-20 DIAGNOSIS — I6312 Cerebral infarction due to embolism of basilar artery: Secondary | ICD-10-CM | POA: Insufficient documentation

## 2017-09-20 DIAGNOSIS — I739 Peripheral vascular disease, unspecified: Secondary | ICD-10-CM | POA: Diagnosis not present

## 2017-09-20 MED ORDER — IOPAMIDOL (ISOVUE-370) INJECTION 76%
75.0000 mL | Freq: Once | INTRAVENOUS | Status: AC | PRN
  Administered 2017-09-20: 75 mL via INTRAVENOUS

## 2017-09-20 NOTE — Telephone Encounter (Signed)
  I called the patient.  The CT angiogram of the neck is unremarkable, I will call the patient when the 2D echocardiogram results are available.  CTA neck 09/20/17:  IMPRESSION: Very mild atherosclerotic change of the aorta and the carotid bifurcations. No stenosis. Limited plaque without evidence of ulceration or irregularity.

## 2017-09-20 NOTE — Telephone Encounter (Signed)
I called the patient.  The patient has an ejection fraction of 40-45%, some left ventricular dysfunction, this is actually improved from 2013.  No change in medical therapy.  The patient is to remain on anticoagulation.    2D echo 09/20/17:  Study Conclusions  - Left ventricle: The cavity size was normal. Wall thickness was increased in a pattern of moderate LVH. Systolic function was mildly to moderately reduced. The estimated ejection fraction was in the range of 40% to 45%. Severe hypokinesis of the apicalanteroseptal myocardium. Features are consistent with a pseudonormal left ventricular filling pattern, with concomitant abnormal relaxation and increased filling pressure (grade 2 diastolic dysfunction). - Mitral valve: Valve area by pressure half-time: 2.34 cm^2. - Right atrium: The atrium was mildly dilated.

## 2017-09-21 DIAGNOSIS — I2510 Atherosclerotic heart disease of native coronary artery without angina pectoris: Secondary | ICD-10-CM | POA: Diagnosis not present

## 2017-09-21 DIAGNOSIS — D45 Polycythemia vera: Secondary | ICD-10-CM | POA: Diagnosis not present

## 2017-09-21 DIAGNOSIS — Z8546 Personal history of malignant neoplasm of prostate: Secondary | ICD-10-CM | POA: Diagnosis not present

## 2017-09-21 DIAGNOSIS — I10 Essential (primary) hypertension: Secondary | ICD-10-CM | POA: Diagnosis not present

## 2017-09-24 DIAGNOSIS — D45 Polycythemia vera: Secondary | ICD-10-CM | POA: Diagnosis not present

## 2017-10-03 ENCOUNTER — Other Ambulatory Visit: Payer: Self-pay | Admitting: Cardiology

## 2017-10-03 ENCOUNTER — Other Ambulatory Visit: Payer: Self-pay | Admitting: Cardiovascular Disease

## 2017-10-08 DIAGNOSIS — Z7901 Long term (current) use of anticoagulants: Secondary | ICD-10-CM | POA: Diagnosis not present

## 2017-10-23 DIAGNOSIS — I10 Essential (primary) hypertension: Secondary | ICD-10-CM | POA: Diagnosis not present

## 2017-10-23 DIAGNOSIS — Z8546 Personal history of malignant neoplasm of prostate: Secondary | ICD-10-CM | POA: Diagnosis not present

## 2017-10-23 DIAGNOSIS — E782 Mixed hyperlipidemia: Secondary | ICD-10-CM | POA: Diagnosis not present

## 2017-10-23 DIAGNOSIS — G459 Transient cerebral ischemic attack, unspecified: Secondary | ICD-10-CM | POA: Diagnosis not present

## 2017-10-24 DIAGNOSIS — Z7901 Long term (current) use of anticoagulants: Secondary | ICD-10-CM | POA: Diagnosis not present

## 2017-11-06 DIAGNOSIS — Z7901 Long term (current) use of anticoagulants: Secondary | ICD-10-CM | POA: Diagnosis not present

## 2017-11-07 ENCOUNTER — Ambulatory Visit: Payer: Medicare Other | Admitting: Oncology

## 2017-11-07 ENCOUNTER — Other Ambulatory Visit: Payer: Medicare Other

## 2017-11-12 ENCOUNTER — Ambulatory Visit: Payer: Self-pay | Admitting: Pharmacist Clinician (PhC)/ Clinical Pharmacy Specialist

## 2017-11-12 DIAGNOSIS — Z7901 Long term (current) use of anticoagulants: Secondary | ICD-10-CM

## 2017-11-12 DIAGNOSIS — I2699 Other pulmonary embolism without acute cor pulmonale: Secondary | ICD-10-CM

## 2017-12-04 ENCOUNTER — Telehealth: Payer: Self-pay

## 2017-12-04 DIAGNOSIS — Z7901 Long term (current) use of anticoagulants: Secondary | ICD-10-CM | POA: Diagnosis not present

## 2017-12-04 NOTE — Telephone Encounter (Signed)
Left a detailed message.Patient called on 2/26 and wanted to schedule a appointment. New appointment is scheduled for 3/8 @ 2:30. (Per 2/26 phone message return).

## 2017-12-13 ENCOUNTER — Other Ambulatory Visit: Payer: Self-pay | Admitting: *Deleted

## 2017-12-13 DIAGNOSIS — D45 Polycythemia vera: Secondary | ICD-10-CM

## 2017-12-14 ENCOUNTER — Inpatient Hospital Stay: Payer: Medicare Other

## 2017-12-14 ENCOUNTER — Inpatient Hospital Stay: Payer: Medicare Other | Attending: Oncology | Admitting: Oncology

## 2017-12-14 VITALS — BP 117/77 | HR 58

## 2017-12-14 VITALS — BP 123/82 | HR 55 | Temp 97.9°F | Resp 20 | Ht 71.0 in | Wt 181.6 lb

## 2017-12-14 DIAGNOSIS — D72829 Elevated white blood cell count, unspecified: Secondary | ICD-10-CM

## 2017-12-14 DIAGNOSIS — D45 Polycythemia vera: Secondary | ICD-10-CM | POA: Diagnosis not present

## 2017-12-14 DIAGNOSIS — Z7901 Long term (current) use of anticoagulants: Secondary | ICD-10-CM | POA: Insufficient documentation

## 2017-12-14 DIAGNOSIS — Z7982 Long term (current) use of aspirin: Secondary | ICD-10-CM | POA: Insufficient documentation

## 2017-12-14 DIAGNOSIS — D72828 Other elevated white blood cell count: Secondary | ICD-10-CM

## 2017-12-14 DIAGNOSIS — Z8673 Personal history of transient ischemic attack (TIA), and cerebral infarction without residual deficits: Secondary | ICD-10-CM | POA: Diagnosis not present

## 2017-12-14 LAB — CBC WITH DIFFERENTIAL (CANCER CENTER ONLY)
BASOS ABS: 0.2 10*3/uL — AB (ref 0.0–0.1)
Basophils Relative: 1 %
Eosinophils Absolute: 0.8 10*3/uL — ABNORMAL HIGH (ref 0.0–0.5)
Eosinophils Relative: 2 %
HEMATOCRIT: 49.1 % (ref 38.4–49.9)
HEMOGLOBIN: 15.1 g/dL (ref 13.0–17.1)
Lymphocytes Relative: 8 %
Lymphs Abs: 2.5 10*3/uL (ref 0.9–3.3)
MCH: 20.7 pg — ABNORMAL LOW (ref 27.2–33.4)
MCHC: 30.8 g/dL — ABNORMAL LOW (ref 32.0–36.0)
MCV: 67.1 fL — ABNORMAL LOW (ref 79.3–98.0)
MONOS PCT: 9 %
Monocytes Absolute: 3 10*3/uL — ABNORMAL HIGH (ref 0.1–0.9)
NEUTROS ABS: 27.2 10*3/uL — AB (ref 1.5–6.5)
NEUTROS PCT: 80 %
Platelet Count: 898 10*3/uL — ABNORMAL HIGH (ref 140–400)
RBC: 7.32 MIL/uL — AB (ref 4.20–5.82)
RDW: 18.9 % — ABNORMAL HIGH (ref 11.0–14.6)
WBC: 33.8 10*3/uL — AB (ref 4.0–10.3)

## 2017-12-14 LAB — CMP (CANCER CENTER ONLY)
ALK PHOS: 86 U/L (ref 40–150)
ALT: 16 U/L (ref 0–55)
AST: 24 U/L (ref 5–34)
Albumin: 4.2 g/dL (ref 3.5–5.0)
Anion gap: 10 (ref 3–11)
BILIRUBIN TOTAL: 0.6 mg/dL (ref 0.2–1.2)
BUN: 25 mg/dL (ref 7–26)
CALCIUM: 9.6 mg/dL (ref 8.4–10.4)
CO2: 25 mmol/L (ref 22–29)
Chloride: 105 mmol/L (ref 98–109)
Creatinine: 1.54 mg/dL — ABNORMAL HIGH (ref 0.70–1.30)
GFR, Est AFR Am: 50 mL/min — ABNORMAL LOW (ref >60)
GFR, Estimated: 43 mL/min — ABNORMAL LOW (ref >60)
Glucose, Bld: 89 mg/dL (ref 70–140)
Potassium: 4.6 mmol/L (ref 3.5–5.1)
SODIUM: 140 mmol/L (ref 136–145)
TOTAL PROTEIN: 7.1 g/dL (ref 6.4–8.3)

## 2017-12-14 NOTE — Patient Instructions (Signed)

## 2017-12-14 NOTE — Progress Notes (Signed)
Pt presents today for phlebotomy per MD orders. Phlebotomy procedure started at 1505 and ended at 1512. 550 grams removed. Patient observed for 30 minutes after procedure without any incident. Patient tolerated procedure well.  VS stable. 16 G RAC IV needle removed intact.

## 2017-12-14 NOTE — Progress Notes (Signed)
Hematology and Oncology Follow Up Visit  Gregory Blankenship [id number] [birth year] 77 y.o. 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 He was found to have an elevated hemoglobin and confirmation of his diagnosis in February of 2013.   Secondary diagnosis:   Prostate cancer diagnosed in 2011. He was found to have a Gleason score 3+4 = 7 after a prostatectomy completed on 06/20/2010. The final pathology showed a stage T2c disease.   Current therapy:  Phlebotomy to keep his hematocrit less than 45.  Interim History: Gregory Blankenship is here for a follow-up visit. He reports no major changes in his health since the last visit. He denied any thrombosis or bleeding episodes and continues to be on warfarin. He denied any weight loss or appetite changes. He denied any excessive fatigue or tiredness. He continues to exercise regularly without any decline in his performance status or quality of life.  He did undergo a hernia operation in November 2018 and did have a slow postoperative bleeding that has resolved. He did have a small right occipital infarct after complaining of symptoms of right arm numbness.  He denied any headaches, blurry vision or neurological deficits. He does not report any fevers, chills, sweats or changes in his appetite. He does not report any chest pain, palpitation, orthopnea or leg edema. He does not report any cough, wheezing or hemoptysis.. Does not report any constipation or diarrhea. Is not reporting nausea or vomiting. He does not report any frequency urgency or hesitancy. He does not report any skeletal complaints of arthralgias or myalgias. Rest of his review of systems is negative.  Medications: I have reviewed the patient's current medications. Current Outpatient Medications  Medication Sig Dispense Refill  . acetaminophen (TYLENOL) 325 MG tablet Take 325 mg by mouth every 6 (six)  hours as needed for mild pain.     Marland Kitchen aspirin 81 MG tablet Take 81 mg by mouth every evening.     . carvedilol (COREG) 6.25 MG tablet TAKE 1 TABLET BY MOUTH TWICE DAILY 180 tablet 2  . colchicine (COLCRYS) 0.6 MG tablet Take 0.6 mg daily by mouth.    . isosorbide mononitrate (IMDUR) 30 MG 24 hr tablet TAKE 1 TABLET BY MOUTH ONCE DAILY 90 tablet 2  . levothyroxine (SYNTHROID, LEVOTHROID) 88 MCG tablet Take 88 mcg by mouth in the morning  2  . simvastatin (ZOCOR) 20 MG tablet Take 20 mg every evening by mouth.     . warfarin (COUMADIN) 5 MG tablet Take 5 mg See admin instructions by mouth. In the evening on Mon/Wed/Fri    . NITROSTAT 0.4 MG SL tablet PLACE 1 TABELT UNDER THE TONGUE EVERY 5 MINUTES AS NEEDED FOR CHEST PAIN. (Patient not taking: Reported on 12/14/2017) 25 tablet 3   No current facility-administered medications for this visit.     Allergies:  Allergies  Allergen Reactions  . Erythromycin Nausea Only    Past Medical History, Surgical history, Social history, and Family History were reviewed and updated.   Physical Exam:  Blood pressure 123/82, pulse (!) 55, temperature 97.9 F (36.6 C), temperature source Oral, resp. rate 20, height 5\' 11"  (1.803 m), weight 181 lb 9.6 oz (82.4 kg), SpO2 99 %. ECOG: 1 General appearance: Alert, awake gentleman appeared without distress. Head: Atraumatic without any abnormalities. Oropharynx: No mucosal ulcers or lesions. Eyes: Sclerae injected. Pupils are equal and round and reactive to light.  Heart: Regular  rate and rhythm. S1, S2. No murmurs or gallops. Lungs: Clear in all lung fields without any rhonchi or wheezes or dullness to percussion. Abdomen: Soft, nontender, nondistended with good bowel sounds. No splenomegaly noted. Musculoskeletal: No joint deformity or effusion. Neurologic: No motor or sensory deficits noted. Skin: No rashes or lesions. Erythema noted on his face.   Lab Results: Lab Results  Component Value Date   WBC  33.8 (H) 12/14/2017   HGB 15.1 08/15/2017   HCT 49.1 12/14/2017   MCV 67.1 (L) 12/14/2017   PLT 898 (H) 12/14/2017     Chemistry      Component Value Date/Time   NA 137 08/15/2017 0922   NA 140 05/09/2017 0844   K 5.0 08/15/2017 0922   K 4.3 05/09/2017 0844   CL 108 08/15/2017 0922   CL 102 02/05/2013 1429   CO2 25 08/15/2017 0922   CO2 25 05/09/2017 0844   BUN 18 08/15/2017 0922   BUN 16.7 05/09/2017 0844   CREATININE 1.30 (H) 08/15/2017 0922   CREATININE 1.2 05/09/2017 0844      Component Value Date/Time   CALCIUM 8.8 (L) 08/15/2017 0922   CALCIUM 9.4 05/09/2017 0844   ALKPHOS 69 08/15/2017 0922   ALKPHOS 99 05/09/2017 0844   AST 22 08/15/2017 0922   AST 49 (H) 05/09/2017 0844   ALT 15 (L) 08/15/2017 0922   ALT 41 05/09/2017 0844   BILITOT 0.6 08/15/2017 0922   BILITOT 0.55 05/09/2017 0844      Impression and Plan:  77 year old man with:  1. JAK 2 positive myeloproliferative disorder in the form of Polycythemia vera. He presented with leukocytosis and polycythemia.  He continues to receive phlebotomy on an intermittent basis to keep his hematocrit less than 45. He has tolerated the procedure well without any symptoms or complaints.  Laboratory data from today were personally reviewed which showed hematocrit of 49. Risks and benefits of continuing phlebotomy was reviewed today and is agreeable to continue at this time.   2. Thrombosis prophylaxis: Risk of developing arterial and venous thrombosis is high in his case. He has elevated white cell count as well as previous thrombosis history. He is chronically anticoagulated with warfarin at this time.  3. Leukocytosis and thrombocytosis: The risks and benefits of starting hydroxyurea to control his white cell count and platelet count was discussed today. Complications associated with this medication include fatigue, arthralgias, mouth sores as well as constitutional symptoms were reviewed. After discussing the risks and  benefits at this time we will defer this option to a later date. If his progress continues to rise hydroxyurea will be started in the near future. Given his recent ischemic stroke him in favor of starting this medication sooner than later.  4.  Ischemic stroke in November 2018. No residual deficits noted. He is on warfarin and aspirin at this time.  5. Follow-up: With 3 months.  15  minutes was spent with the patient face-to-face today.  More than 50% of time was dedicated to patient counseling, education and coordination of his multifaceted care.   Zola Button, MD 3/8/20192:58 PM

## 2017-12-31 ENCOUNTER — Other Ambulatory Visit: Payer: Self-pay | Admitting: Cardiovascular Disease

## 2018-01-01 DIAGNOSIS — Z7901 Long term (current) use of anticoagulants: Secondary | ICD-10-CM | POA: Diagnosis not present

## 2018-02-18 DIAGNOSIS — Z7901 Long term (current) use of anticoagulants: Secondary | ICD-10-CM | POA: Diagnosis not present

## 2018-02-28 DIAGNOSIS — R5383 Other fatigue: Secondary | ICD-10-CM | POA: Diagnosis not present

## 2018-02-28 DIAGNOSIS — I10 Essential (primary) hypertension: Secondary | ICD-10-CM | POA: Diagnosis not present

## 2018-02-28 DIAGNOSIS — D45 Polycythemia vera: Secondary | ICD-10-CM | POA: Diagnosis not present

## 2018-03-14 DIAGNOSIS — L821 Other seborrheic keratosis: Secondary | ICD-10-CM | POA: Diagnosis not present

## 2018-03-14 DIAGNOSIS — C4441 Basal cell carcinoma of skin of scalp and neck: Secondary | ICD-10-CM | POA: Diagnosis not present

## 2018-03-14 DIAGNOSIS — L570 Actinic keratosis: Secondary | ICD-10-CM | POA: Diagnosis not present

## 2018-03-14 DIAGNOSIS — Z85828 Personal history of other malignant neoplasm of skin: Secondary | ICD-10-CM | POA: Diagnosis not present

## 2018-03-14 DIAGNOSIS — Z85820 Personal history of malignant melanoma of skin: Secondary | ICD-10-CM | POA: Diagnosis not present

## 2018-03-15 ENCOUNTER — Telehealth: Payer: Self-pay | Admitting: Oncology

## 2018-03-15 ENCOUNTER — Inpatient Hospital Stay: Payer: Medicare Other | Attending: Oncology | Admitting: Oncology

## 2018-03-15 ENCOUNTER — Inpatient Hospital Stay: Payer: Medicare Other

## 2018-03-15 VITALS — BP 100/70 | HR 51 | Temp 98.0°F | Resp 17 | Ht 71.0 in | Wt 179.1 lb

## 2018-03-15 DIAGNOSIS — Z7901 Long term (current) use of anticoagulants: Secondary | ICD-10-CM | POA: Diagnosis not present

## 2018-03-15 DIAGNOSIS — Z79899 Other long term (current) drug therapy: Secondary | ICD-10-CM | POA: Diagnosis not present

## 2018-03-15 DIAGNOSIS — Z8673 Personal history of transient ischemic attack (TIA), and cerebral infarction without residual deficits: Secondary | ICD-10-CM | POA: Diagnosis not present

## 2018-03-15 DIAGNOSIS — D45 Polycythemia vera: Secondary | ICD-10-CM | POA: Diagnosis not present

## 2018-03-15 DIAGNOSIS — Z7982 Long term (current) use of aspirin: Secondary | ICD-10-CM | POA: Insufficient documentation

## 2018-03-15 DIAGNOSIS — Z8546 Personal history of malignant neoplasm of prostate: Secondary | ICD-10-CM | POA: Diagnosis not present

## 2018-03-15 DIAGNOSIS — D72829 Elevated white blood cell count, unspecified: Secondary | ICD-10-CM | POA: Insufficient documentation

## 2018-03-15 LAB — CBC WITH DIFFERENTIAL (CANCER CENTER ONLY)
BASOS ABS: 0.4 10*3/uL — AB (ref 0.0–0.1)
BASOS PCT: 2 %
EOS ABS: 0.5 10*3/uL (ref 0.0–0.5)
EOS PCT: 2 %
HCT: 44.6 % (ref 38.4–49.9)
HEMOGLOBIN: 14.2 g/dL (ref 13.0–17.1)
Lymphocytes Relative: 9 %
Lymphs Abs: 2.6 10*3/uL (ref 0.9–3.3)
MCH: 21.3 pg — ABNORMAL LOW (ref 27.2–33.4)
MCHC: 31.8 g/dL — ABNORMAL LOW (ref 32.0–36.0)
MCV: 66.8 fL — ABNORMAL LOW (ref 79.3–98.0)
Monocytes Absolute: 3.9 10*3/uL — ABNORMAL HIGH (ref 0.1–0.9)
Monocytes Relative: 14 %
NEUTROS PCT: 73 %
Neutro Abs: 21 10*3/uL — ABNORMAL HIGH (ref 1.5–6.5)
PLATELETS: 784 10*3/uL — AB (ref 140–400)
RBC: 6.68 MIL/uL — AB (ref 4.20–5.82)
RDW: 18.4 % — ABNORMAL HIGH (ref 11.0–14.6)
WBC: 28.5 10*3/uL — AB (ref 4.0–10.3)

## 2018-03-15 LAB — CMP (CANCER CENTER ONLY)
ALBUMIN: 4.2 g/dL (ref 3.5–5.0)
ALT: 17 U/L (ref 0–55)
AST: 28 U/L (ref 5–34)
Alkaline Phosphatase: 91 U/L (ref 40–150)
Anion gap: 7 (ref 3–11)
BUN: 25 mg/dL (ref 7–26)
CHLORIDE: 100 mmol/L (ref 98–109)
CO2: 28 mmol/L (ref 22–29)
Calcium: 9.1 mg/dL (ref 8.4–10.4)
Creatinine: 1.44 mg/dL — ABNORMAL HIGH (ref 0.70–1.30)
GFR, EST AFRICAN AMERICAN: 54 mL/min — AB (ref >60)
GFR, Estimated: 46 mL/min — ABNORMAL LOW (ref >60)
GLUCOSE: 59 mg/dL — AB (ref 70–140)
Potassium: 4.8 mmol/L (ref 3.5–5.1)
SODIUM: 135 mmol/L — AB (ref 136–145)
Total Bilirubin: 0.6 mg/dL (ref 0.2–1.2)
Total Protein: 6.9 g/dL (ref 6.4–8.3)

## 2018-03-15 MED ORDER — HYDROXYUREA 500 MG PO CAPS: 500.0000 mg | ORAL_CAPSULE | Freq: Two times a day (BID) | ORAL | 3 refills | Status: DC

## 2018-03-15 NOTE — Progress Notes (Signed)
Hematology and Oncology Follow Up Visit  Gregory Blankenship [id number] 09/04/1941 77 y.o. 03/15/2018 12:42 PM   Principle Diagnosis: 77 year old man with polycythemia vera that is JAK2 positive diagnosed in February 2013.   Secondary diagnosis:   Prostate cancer diagnosed in 2011. He was found to have a Gleason score 3+4 = 7 after a prostatectomy completed on 06/20/2010. The final pathology showed a stage T2c disease.   Current therapy:  Phlebotomy to keep his hematocrit less than 45.  Interim History: Gregory Blankenship presents today for a follow-up.  Since last visit, he reports no major changes in his health.  He did develop an episode of gout and currently on colchicine to improve his symptoms.  He reports his cath is affecting his fingers which are improving at this time.  He denies any recent thrombosis or bleeding episodes.  He denies any other strokes or myocardial infarctions.  He remains active and was traveling to [place] in the last few months.   He denied any headaches, blurry vision or syncope or seizures.  He denies any peripheral neuropathy or alteration of mental status.  He does not report any fevers, chills, sweats or changes in his appetite. He does not report any chest pain, palpitation, orthopnea or leg edema. He does not report any cough, wheezing or hemoptysis.. Does not report any constipation or diarrhea.  He denies any hematochezia or melena. He does not report any frequency urgency or hesitancy. He does not report any bone pain or pathological fractures.  He denies any lymphadenopathy or petechiae.  Rest of his review of systems is negative.  Medications: I have reviewed the patient's current medications. Current Outpatient Medications  Medication Sig Dispense Refill  . acetaminophen (TYLENOL) 325 MG tablet Take 325 mg by mouth every 6 (six) hours as needed for mild pain.     Marland Kitchen aspirin 81 MG tablet Take 81 mg by mouth every evening.     . carvedilol (COREG) 6.25 MG tablet  TAKE 1 TABLET BY MOUTH TWICE DAILY 180 tablet 2  . colchicine (COLCRYS) 0.6 MG tablet Take 0.6 mg daily by mouth.    . isosorbide mononitrate (IMDUR) 30 MG 24 hr tablet TAKE 1 TABLET BY MOUTH ONCE DAILY 90 tablet 2  . isosorbide mononitrate (IMDUR) 30 MG 24 hr tablet TAKE 1 TABLET BY MOUTH ONCE DAILY 90 tablet 2  . levothyroxine (SYNTHROID, LEVOTHROID) 88 MCG tablet Take 88 mcg by mouth in the morning  2  . NITROSTAT 0.4 MG SL tablet PLACE 1 TABELT UNDER THE TONGUE EVERY 5 MINUTES AS NEEDED FOR CHEST PAIN. (Patient not taking: Reported on 12/14/2017) 25 tablet 3  . simvastatin (ZOCOR) 20 MG tablet Take 20 mg every evening by mouth.     . warfarin (COUMADIN) 5 MG tablet Take 5 mg See admin instructions by mouth. In the evening on Mon/Wed/Fri     No current facility-administered medications for this visit.     Allergies:  Allergies  Allergen Reactions  . Erythromycin Nausea Only    Past Medical History, Surgical history, Social history, and Family History were reviewed and updated.   Physical Exam: Blood pressure 100/70, pulse (!) 51, temperature 98 F (36.7 C), temperature source Oral, resp. rate 17, height 5\' 11"  (1.803 m), weight 179 lb 1.6 oz (81.2 kg), SpO2 98 %.   ECOG: 1 General appearance: Well-appearing gentleman without distress. Head: Normocephalic without abnormalities. Oropharynx: Mucous membranes are moist and pink.. Eyes: Pupils are equal and round reactive to  light.  Heart: Regular rate without any murmurs or gallops.  No leg edema. Lungs: Clear without any rhonchi, wheezes or dullness to percussion. Abdomen: Soft, nontender without any rebound or guarding.  Good bowel sounds. Musculoskeletal: No clubbing or cyanosis. Neurologic: No motor or sensory deficits. Skin: No ecchymosis or petechiae.   Lab Results: Lab Results  Component Value Date   WBC 33.8 (H) 12/14/2017   HGB 15.1 12/14/2017   HCT 49.1 12/14/2017   MCV 67.1 (L) 12/14/2017   PLT 898 (H) 12/14/2017      Chemistry      Component Value Date/Time   NA 140 12/14/2017 1427   NA 140 05/09/2017 0844   K 4.6 12/14/2017 1427   K 4.3 05/09/2017 0844   CL 105 12/14/2017 1427   CL 102 02/05/2013 1429   CO2 25 12/14/2017 1427   CO2 25 05/09/2017 0844   BUN 25 12/14/2017 1427   BUN 16.7 05/09/2017 0844   CREATININE 1.54 (H) 12/14/2017 1427   CREATININE 1.2 05/09/2017 0844      Component Value Date/Time   CALCIUM 9.6 12/14/2017 1427   CALCIUM 9.4 05/09/2017 0844   ALKPHOS 86 12/14/2017 1427   ALKPHOS 99 05/09/2017 0844   AST 24 12/14/2017 1427   AST 49 (H) 05/09/2017 0844   ALT 16 12/14/2017 1427   ALT 41 05/09/2017 0844   BILITOT 0.6 12/14/2017 1427   BILITOT 0.55 05/09/2017 0844      Impression and Plan:  77 year old man with:  1.  Polycythemia vera that is JAK 2 positive diagnosed in 2013.   He has been receiving a phlebotomy to keep his hematocrit below 45 and his last phlebotomy was in March 2019.  Risks and benefits of continuing this procedure was discussed today is agreeable to continue.    His hematocrit today is below 45 and will not receive phlebotomy at this time.  This can be resumed in 3 months.   2. Thrombosis prophylaxis: He is chronically anticoagulated with warfarin which offers him protection.  3. Leukocytosis and thrombocytosis: His counts continue to rise which would increase his risk of future thrombosis.  The risks and benefits of starting hydroxyurea to control his counts were reviewed today.  Complications associated with this medication include fatigue, arthralgias, mucositis among others.  After discussion today, he is agreeable to proceed and he will receive it at 500 mg twice a day.  We will assess his counts in about 4 to 6 weeks to ensure stability.  4.  Ischemic stroke in November 2018.  No residual complications related.  He remains on warfarin and aspirin.  5. Follow-up: With 6 weeks to evaluate his clinical status.  15  minutes was spent  with the patient face-to-face today.  More than 50% of time was dedicated to patient counseling, education and answering questions regarding future plan of care.   Zola Button, MD 6/7/201912:42 PM

## 2018-03-15 NOTE — Telephone Encounter (Signed)
Appointments scheduled AVS/Calendar printed per 6/7 los

## 2018-03-18 DIAGNOSIS — I10 Essential (primary) hypertension: Secondary | ICD-10-CM | POA: Diagnosis not present

## 2018-03-18 DIAGNOSIS — G459 Transient cerebral ischemic attack, unspecified: Secondary | ICD-10-CM | POA: Diagnosis not present

## 2018-03-18 DIAGNOSIS — Z8546 Personal history of malignant neoplasm of prostate: Secondary | ICD-10-CM | POA: Diagnosis not present

## 2018-03-18 DIAGNOSIS — E782 Mixed hyperlipidemia: Secondary | ICD-10-CM | POA: Diagnosis not present

## 2018-03-19 DIAGNOSIS — Z7901 Long term (current) use of anticoagulants: Secondary | ICD-10-CM | POA: Diagnosis not present

## 2018-03-29 DIAGNOSIS — E7800 Pure hypercholesterolemia, unspecified: Secondary | ICD-10-CM | POA: Diagnosis not present

## 2018-03-29 DIAGNOSIS — Z7901 Long term (current) use of anticoagulants: Secondary | ICD-10-CM | POA: Diagnosis not present

## 2018-03-29 DIAGNOSIS — I10 Essential (primary) hypertension: Secondary | ICD-10-CM | POA: Diagnosis not present

## 2018-03-29 DIAGNOSIS — M109 Gout, unspecified: Secondary | ICD-10-CM | POA: Diagnosis not present

## 2018-04-12 DIAGNOSIS — Z8546 Personal history of malignant neoplasm of prostate: Secondary | ICD-10-CM | POA: Diagnosis not present

## 2018-04-12 DIAGNOSIS — I10 Essential (primary) hypertension: Secondary | ICD-10-CM | POA: Diagnosis not present

## 2018-04-12 DIAGNOSIS — I2510 Atherosclerotic heart disease of native coronary artery without angina pectoris: Secondary | ICD-10-CM | POA: Diagnosis not present

## 2018-04-12 DIAGNOSIS — E039 Hypothyroidism, unspecified: Secondary | ICD-10-CM | POA: Diagnosis not present

## 2018-04-17 DIAGNOSIS — E039 Hypothyroidism, unspecified: Secondary | ICD-10-CM | POA: Diagnosis not present

## 2018-04-17 DIAGNOSIS — Z7901 Long term (current) use of anticoagulants: Secondary | ICD-10-CM | POA: Diagnosis not present

## 2018-04-17 DIAGNOSIS — Z8546 Personal history of malignant neoplasm of prostate: Secondary | ICD-10-CM | POA: Diagnosis not present

## 2018-04-18 ENCOUNTER — Inpatient Hospital Stay: Payer: Medicare Other | Attending: Oncology

## 2018-04-18 ENCOUNTER — Inpatient Hospital Stay (HOSPITAL_BASED_OUTPATIENT_CLINIC_OR_DEPARTMENT_OTHER): Payer: Medicare Other | Admitting: Oncology

## 2018-04-18 ENCOUNTER — Telehealth: Payer: Self-pay | Admitting: Oncology

## 2018-04-18 VITALS — BP 104/69 | HR 53 | Temp 98.2°F | Resp 18 | Ht 71.0 in | Wt 179.5 lb

## 2018-04-18 DIAGNOSIS — Z7901 Long term (current) use of anticoagulants: Secondary | ICD-10-CM | POA: Insufficient documentation

## 2018-04-18 DIAGNOSIS — Z79899 Other long term (current) drug therapy: Secondary | ICD-10-CM | POA: Diagnosis not present

## 2018-04-18 DIAGNOSIS — Z8546 Personal history of malignant neoplasm of prostate: Secondary | ICD-10-CM | POA: Insufficient documentation

## 2018-04-18 DIAGNOSIS — Z86718 Personal history of other venous thrombosis and embolism: Secondary | ICD-10-CM | POA: Diagnosis not present

## 2018-04-18 DIAGNOSIS — D72829 Elevated white blood cell count, unspecified: Secondary | ICD-10-CM | POA: Diagnosis not present

## 2018-04-18 DIAGNOSIS — D45 Polycythemia vera: Secondary | ICD-10-CM

## 2018-04-18 LAB — CBC WITH DIFFERENTIAL (CANCER CENTER ONLY)
BASOS PCT: 3 %
Basophils Absolute: 0.4 10*3/uL — ABNORMAL HIGH (ref 0.0–0.1)
EOS ABS: 0.3 10*3/uL (ref 0.0–0.5)
Eosinophils Relative: 3 %
HCT: 45.3 % (ref 38.4–49.9)
HEMOGLOBIN: 14.8 g/dL (ref 13.0–17.1)
LYMPHS ABS: 2.1 10*3/uL (ref 0.9–3.3)
Lymphocytes Relative: 18 %
MCH: 23.5 pg — AB (ref 27.2–33.4)
MCHC: 32.7 g/dL (ref 32.0–36.0)
MCV: 71.8 fL — ABNORMAL LOW (ref 79.3–98.0)
Monocytes Absolute: 1.9 10*3/uL — ABNORMAL HIGH (ref 0.1–0.9)
Monocytes Relative: 16 %
NEUTROS PCT: 60 %
Neutro Abs: 7.2 10*3/uL — ABNORMAL HIGH (ref 1.5–6.5)
PLATELETS: 383 10*3/uL (ref 140–400)
RBC: 6.31 MIL/uL — ABNORMAL HIGH (ref 4.20–5.82)
RDW: 25.8 % — ABNORMAL HIGH (ref 11.0–14.6)
WBC: 11.9 10*3/uL — AB (ref 4.0–10.3)

## 2018-04-18 LAB — CMP (CANCER CENTER ONLY)
ALT: 18 U/L (ref 0–44)
AST: 24 U/L (ref 15–41)
Albumin: 4.4 g/dL (ref 3.5–5.0)
Alkaline Phosphatase: 93 U/L (ref 38–126)
Anion gap: 7 (ref 5–15)
BILIRUBIN TOTAL: 0.6 mg/dL (ref 0.3–1.2)
BUN: 20 mg/dL (ref 8–23)
CHLORIDE: 100 mmol/L (ref 98–111)
CO2: 27 mmol/L (ref 22–32)
CREATININE: 1.37 mg/dL — AB (ref 0.61–1.24)
Calcium: 9.2 mg/dL (ref 8.9–10.3)
GFR, EST AFRICAN AMERICAN: 57 mL/min — AB (ref >60)
GFR, EST NON AFRICAN AMERICAN: 49 mL/min — AB (ref >60)
Glucose, Bld: 77 mg/dL (ref 70–99)
Potassium: 5.3 mmol/L — ABNORMAL HIGH (ref 3.5–5.1)
Sodium: 134 mmol/L — ABNORMAL LOW (ref 135–145)
Total Protein: 7.1 g/dL (ref 6.5–8.1)

## 2018-04-18 NOTE — Telephone Encounter (Signed)
Appointments scheduled AVS/Calendar printed per 7/11 los °

## 2018-04-18 NOTE — Progress Notes (Signed)
Hematology and Oncology Follow Up Visit  Gregory Blankenship [id number] Apr 13, 1941 77 y.o. 04/18/2018 12:36 PM   Principle Diagnosis: 77 year old man with polycythemia vera diagnosed in February 2013.  He was found to have Jak 2  mutation the time of diagnosis.  Secondary diagnosis:   Prostate cancer diagnosed in 2011. He was found to have a Gleason score 3+4 = 7 after a prostatectomy completed on 06/20/2010. The final pathology showed a stage T2c disease.   Current therapy:   Phlebotomy to keep his hematocrit less than 45. Hydroxyurea 500 mg twice a day started in June 2019.  Interim History: Gregory Blankenship presents today for a follow-up.  Since the last visit, he was started on hydroxyurea and is taken it since that time.  He reports no complications related to this medication or new side effects.  He denies any excessive fatigue or tiredness.  He denies any easy bruising or bleeding.  He denies any recent thrombosis episodes.  He continues to be active and attends to activities of daily living.   He denied any headaches, blurry vision or syncope or seizures.  He denies any alteration mental status or confusion. He does not report any fevers, chills, sweats.  He denies any weight loss or appetite changes.  He does not report any chest pain, palpitation, orthopnea or leg edema. He does not report any cough, wheezing or hemoptysis. Does not report any constipation or diarrhea.  He denies any hematochezia or melena. He does not report any frequency urgency or hesitancy. He does not report any bone pain or arthralgias.  He denies any lymphadenopathy or petechiae.  He denies any bleeding or clotting tendencies.  Rest of his review of systems is negative.  Medications: I have reviewed the patient's current medications. Current Outpatient Medications  Medication Sig Dispense Refill  . acetaminophen (TYLENOL) 325 MG tablet Take 325 mg by mouth every 6 (six) hours as needed for mild pain.     Marland Kitchen aspirin 81  MG tablet Take 81 mg by mouth every evening.     . carvedilol (COREG) 6.25 MG tablet TAKE 1 TABLET BY MOUTH TWICE DAILY 180 tablet 2  . colchicine (COLCRYS) 0.6 MG tablet Take 0.6 mg daily by mouth.    . hydroxyurea (HYDREA) 500 MG capsule Take 1 capsule (500 mg total) by mouth 2 (two) times daily. May take with food to minimize GI side effects. 90 capsule 3  . isosorbide mononitrate (IMDUR) 30 MG 24 hr tablet TAKE 1 TABLET BY MOUTH ONCE DAILY 90 tablet 2  . isosorbide mononitrate (IMDUR) 30 MG 24 hr tablet TAKE 1 TABLET BY MOUTH ONCE DAILY 90 tablet 2  . levothyroxine (SYNTHROID, LEVOTHROID) 88 MCG tablet Take 88 mcg by mouth in the morning  2  . NITROSTAT 0.4 MG SL tablet PLACE 1 TABELT UNDER THE TONGUE EVERY 5 MINUTES AS NEEDED FOR CHEST PAIN. (Patient not taking: Reported on 12/14/2017) 25 tablet 3  . simvastatin (ZOCOR) 20 MG tablet Take 20 mg every evening by mouth.     . warfarin (COUMADIN) 5 MG tablet Take 5 mg See admin instructions by mouth. In the evening on Mon/Wed/Fri     No current facility-administered medications for this visit.     Allergies:  Allergies  Allergen Reactions  . Erythromycin Nausea Only    Past Medical History, Surgical history, Social history, and Family History were reviewed and updated.   Physical Exam:  Blood pressure 104/69, pulse (!) 53, temperature 98.2 F (  36.8 C), temperature source Oral, resp. rate 18, height 5\' 11"  (1.803 m), weight 179 lb 8 oz (81.4 kg), SpO2 100 %.   ECOG: 1 General appearance: Alert, weak man without distress. Head: Atraumatic without abnormalities. Oropharynx: Mucous membranes are moist and pink. Eyes: Sclera anicteric.  Pupils are equal and round reactive to light. Heart: Regular rate without any murmurs or gallops.  No leg edema. Lungs: Clear without any rhonchi, wheezes or dullness to percussion. Abdomen: Soft, without any rebound or guarding.  Good bowel sounds. Musculoskeletal: No joint deformity or  effusion. Neurologic: No deficits noted on motor and sensory exam. Skin: No petechiae or rash noted.   Lab Results: Lab Results  Component Value Date   WBC 28.5 (H) 03/15/2018   HGB 14.2 03/15/2018   HCT 44.6 03/15/2018   MCV 66.8 (L) 03/15/2018   PLT 784 (H) 03/15/2018     Chemistry      Component Value Date/Time   NA 135 (L) 03/15/2018 1232   NA 140 05/09/2017 0844   K 4.8 03/15/2018 1232   K 4.3 05/09/2017 0844   CL 100 03/15/2018 1232   CL 102 02/05/2013 1429   CO2 28 03/15/2018 1232   CO2 25 05/09/2017 0844   BUN 25 03/15/2018 1232   BUN 16.7 05/09/2017 0844   CREATININE 1.44 (H) 03/15/2018 1232   CREATININE 1.2 05/09/2017 0844      Component Value Date/Time   CALCIUM 9.1 03/15/2018 1232   CALCIUM 9.4 05/09/2017 0844   ALKPHOS 91 03/15/2018 1232   ALKPHOS 99 05/09/2017 0844   AST 28 03/15/2018 1232   AST 49 (H) 05/09/2017 0844   ALT 17 03/15/2018 1232   ALT 41 05/09/2017 0844   BILITOT 0.6 03/15/2018 1232   BILITOT 0.55 05/09/2017 0844      Impression and Plan:  77 year old man with:  1.  Polycythemia vera diagnosed in 2013 after presenting with elevated hemoglobin and positive Jak 2 mutation.  He has been receiving a phlebotomy intermittently keep his hematocrit below 45.  He denies any complications related to this procedure with excellent control of his hemoglobin.  His hematocrit today is slightly above 45 and does not require phlebotomy.   2. Thrombosis prophylaxis: His risk of thrombosis is high given his age and previous thrombosis history.  He is anticoagulated with warfarin and no recent issues noted.  3. Leukocytosis and thrombocytosis: He is currently on hydroxyurea which has decreased his counts adequately at this time.  No complications related to this medication and will continue at 500 mg twice daily dosing.  I will check his count in 4 weeks to ensure stability.   4.  Ischemic stroke in November 2018.  Resolved at this time and he remains  on aspirin in addition to warfarin.  5. Follow-up: With 4 weeks to follow-up on his clinical status.    15  minutes was spent with the patient face-to-face today.  More than 50% of time was dedicated to patient counseling, education and coordinating his future care.   Zola Button, MD 7/11/201912:36 PM

## 2018-04-30 DIAGNOSIS — Z7901 Long term (current) use of anticoagulants: Secondary | ICD-10-CM | POA: Diagnosis not present

## 2018-05-22 ENCOUNTER — Telehealth: Payer: Self-pay | Admitting: Oncology

## 2018-05-22 ENCOUNTER — Inpatient Hospital Stay: Payer: Medicare Other

## 2018-05-22 ENCOUNTER — Inpatient Hospital Stay: Payer: Medicare Other | Attending: Oncology

## 2018-05-22 ENCOUNTER — Inpatient Hospital Stay (HOSPITAL_BASED_OUTPATIENT_CLINIC_OR_DEPARTMENT_OTHER): Payer: Medicare Other | Admitting: Oncology

## 2018-05-22 VITALS — BP 96/67 | HR 51 | Temp 97.6°F | Resp 18 | Ht 71.0 in | Wt 179.5 lb

## 2018-05-22 DIAGNOSIS — R7989 Other specified abnormal findings of blood chemistry: Secondary | ICD-10-CM | POA: Diagnosis not present

## 2018-05-22 DIAGNOSIS — Z8673 Personal history of transient ischemic attack (TIA), and cerebral infarction without residual deficits: Secondary | ICD-10-CM | POA: Insufficient documentation

## 2018-05-22 DIAGNOSIS — D45 Polycythemia vera: Secondary | ICD-10-CM | POA: Insufficient documentation

## 2018-05-22 DIAGNOSIS — Z7901 Long term (current) use of anticoagulants: Secondary | ICD-10-CM

## 2018-05-22 DIAGNOSIS — D72829 Elevated white blood cell count, unspecified: Secondary | ICD-10-CM

## 2018-05-22 DIAGNOSIS — D72828 Other elevated white blood cell count: Secondary | ICD-10-CM

## 2018-05-22 LAB — CBC WITH DIFFERENTIAL (CANCER CENTER ONLY)
Basophils Absolute: 0.1 10*3/uL (ref 0.0–0.1)
Basophils Relative: 1 %
EOS ABS: 0.2 10*3/uL (ref 0.0–0.5)
EOS PCT: 3 %
HCT: 45.1 % (ref 38.4–49.9)
Hemoglobin: 14.6 g/dL (ref 13.0–17.1)
LYMPHS ABS: 1.5 10*3/uL (ref 0.9–3.3)
LYMPHS PCT: 17 %
MCH: 26.2 pg — AB (ref 27.2–33.4)
MCHC: 32.4 g/dL (ref 32.0–36.0)
MCV: 80.7 fL (ref 79.3–98.0)
MONOS PCT: 9 %
Monocytes Absolute: 0.8 10*3/uL (ref 0.1–0.9)
Neutro Abs: 6.2 10*3/uL (ref 1.5–6.5)
Neutrophils Relative %: 70 %
PLATELETS: 185 10*3/uL (ref 140–400)
RBC: 5.59 MIL/uL (ref 4.20–5.82)
RDW: 42 % — ABNORMAL HIGH (ref 11.0–14.6)
WBC Count: 8.8 10*3/uL (ref 4.0–10.3)

## 2018-05-22 LAB — CMP (CANCER CENTER ONLY)
ALBUMIN: 4.2 g/dL (ref 3.5–5.0)
ALT: 19 U/L (ref 0–44)
AST: 22 U/L (ref 15–41)
Alkaline Phosphatase: 93 U/L (ref 38–126)
Anion gap: 9 (ref 5–15)
BUN: 20 mg/dL (ref 8–23)
CHLORIDE: 103 mmol/L (ref 98–111)
CO2: 25 mmol/L (ref 22–32)
CREATININE: 1.25 mg/dL — AB (ref 0.61–1.24)
Calcium: 8.8 mg/dL — ABNORMAL LOW (ref 8.9–10.3)
GFR, Est AFR Am: >60 mL/min (ref >60)
GFR, Estimated: 55 mL/min — ABNORMAL LOW (ref >60)
GLUCOSE: 65 mg/dL — AB (ref 70–99)
POTASSIUM: 4.4 mmol/L (ref 3.5–5.1)
SODIUM: 137 mmol/L (ref 135–145)
Total Bilirubin: 0.7 mg/dL (ref 0.3–1.2)
Total Protein: 6.8 g/dL (ref 6.5–8.1)

## 2018-05-22 NOTE — Progress Notes (Signed)
Hematology and Oncology Follow Up Visit  Gregory Blankenship [id number] Apr 18, 1941 77 y.o. 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    Secondary diagnosis:   Prostate cancer diagnosed in 2011. He was found to have a Gleason score 3+4 = 7 after a prostatectomy completed on 06/20/2010. The final pathology showed a stage T2c disease.   Current therapy:   Phlebotomy to keep his hematocrit less than 45.  Hydroxyurea 500 mg twice a day started in June 2019.  Interim History: Gregory Blankenship is here for a follow-up visit.  The last visit, he reports no changes in his health or complaints.  He remains on hydroxyurea at 500 mg twice a day without any issues.  He denies any oral ulcers or excessive fatigue.  He denies any pruritus or dysphagia.  He continues to be on warfarin without any issues related to this medication.  He denies any falls or syncope.  He denies any neurological deficits or hospitalizations.  Continues to perform activities of daily living without any decline.   He denied any headaches, blurry vision or syncope or seizures.  He denies any dizziness or alteration of mentation. He does not report any fevers, chills, sweats.  He denies any excessive fatigue or tiredness.  He does not report any chest pain, palpitation, orthopnea or leg edema. He does not report any cough, wheezing or hemoptysis. Does not report any constipation or diarrhea.  He denies any hematochezia or melena. He does not report any frequency urgency or hesitancy. He does not report any bone fractures or body aches.  He denies any lymphadenopathy or petechiae.  He denies any easy bruising or thrombosis episodes.  Rest of his review of systems is negative.  Medications: I have reviewed the patient's current medications. Current Outpatient Medications  Medication Sig Dispense Refill  . acetaminophen  (TYLENOL) 325 MG tablet Take 325 mg by mouth every 6 (six) hours as needed for mild pain.     Marland Kitchen aspirin 81 MG tablet Take 81 mg by mouth every evening.     . carvedilol (COREG) 6.25 MG tablet TAKE 1 TABLET BY MOUTH TWICE DAILY 180 tablet 2  . colchicine (COLCRYS) 0.6 MG tablet Take 0.6 mg daily by mouth.    . hydroxyurea (HYDREA) 500 MG capsule Take 1 capsule (500 mg total) by mouth 2 (two) times daily. May take with food to minimize GI side effects. 90 capsule 3  . isosorbide mononitrate (IMDUR) 30 MG 24 hr tablet TAKE 1 TABLET BY MOUTH ONCE DAILY 90 tablet 2  . isosorbide mononitrate (IMDUR) 30 MG 24 hr tablet TAKE 1 TABLET BY MOUTH ONCE DAILY 90 tablet 2  . levothyroxine (SYNTHROID, LEVOTHROID) 88 MCG tablet Take 88 mcg by mouth in the morning  2  . NITROSTAT 0.4 MG SL tablet PLACE 1 TABELT UNDER THE TONGUE EVERY 5 MINUTES AS NEEDED FOR CHEST PAIN. (Patient not taking: Reported on 12/14/2017) 25 tablet 3  . simvastatin (ZOCOR) 20 MG tablet Take 20 mg every evening by mouth.     . warfarin (COUMADIN) 5 MG tablet Take 5 mg See admin instructions by mouth. In the evening on Mon/Wed/Fri     No current facility-administered medications for this visit.     Allergies:  Allergies  Allergen Reactions  . Erythromycin Nausea Only    Past Medical History, Surgical history, Social history, and Family History were  reviewed and updated.   Physical Exam:  Blood pressure 96/67, pulse (!) 51, temperature 97.6 F (36.4 C), temperature source Oral, resp. rate 18, height 5\' 11"  (1.803 m), weight 179 lb 8 oz (81.4 kg), SpO2 100 %.    ECOG: 1 General appearance: Alert, awake comfortable gentleman without distress. Head: Normocephalic without abnormalities. Oropharynx: No oral thrush or ulcers. Eyes: Sclera are clear without injection or icterus. Heart: Regular rate without any murmurs or gallops.  S1 and S2 and no leg edema. Lungs: Clear in all lung fields without any wheezes, rhonchi or dullness to  percussion. Abdomen: Soft, without any rebound or guarding.  No shifting dullness or ascites. Musculoskeletal: No clubbing or cyanosis. Neurologic: No motor or sensory deficits noted. Skin: No ecchymosis or skin rash.   Lab Results: Lab Results  Component Value Date   WBC 11.9 (H) 04/18/2018   HGB 14.8 04/18/2018   HCT 45.3 04/18/2018   MCV 71.8 (L) 04/18/2018   PLT 383 04/18/2018     Chemistry      Component Value Date/Time   NA 134 (L) 04/18/2018 1230   NA 140 05/09/2017 0844   K 5.3 (H) 04/18/2018 1230   K 4.3 05/09/2017 0844   CL 100 04/18/2018 1230   CL 102 02/05/2013 1429   CO2 27 04/18/2018 1230   CO2 25 05/09/2017 0844   BUN 20 04/18/2018 1230   BUN 16.7 05/09/2017 0844   CREATININE 1.37 (H) 04/18/2018 1230   CREATININE 1.2 05/09/2017 0844      Component Value Date/Time   CALCIUM 9.2 04/18/2018 1230   CALCIUM 9.4 05/09/2017 0844   ALKPHOS 93 04/18/2018 1230   ALKPHOS 99 05/09/2017 0844   AST 24 04/18/2018 1230   AST 49 (H) 05/09/2017 0844   ALT 18 04/18/2018 1230   ALT 41 05/09/2017 0844   BILITOT 0.6 04/18/2018 1230   BILITOT 0.55 05/09/2017 0844      Impression and Plan:  77 year old man with:  1.  Polycythemia vera, Jak 2+ diagnosed in 2013 presented with elevated hemoglobin as well as white cell count and platelets.  Laboratory data were reviewed today and his open is at 14.6 with a hematocrit of 45.  Risks and benefits of proceeding with phlebotomy reviewed today.  The natural course of this disease as well as alternative to phlebotomy were reviewed today.  The plan is to withhold phlebotomy for the time being unless his hematocrit is over 45 and will continue to monitor periodically.   2. Thrombosis prophylaxis: He is fully anticoagulated at this time and he has increased risk of thrombosis but certainly protected warfarin.  No additional anticoagulation is needed.  3. Leukocytosis and thrombocytosis: To his polycythemia vera and  myeloproliferative disorder.  He is currently on hydroxyurea which maintaining his counts adequately.  I will decrease his hydroxyurea to 500 mg once a day given the rapid drop in his counts.   4.  Ischemic stroke in November 2018.  No residual deficits noted and no recent events noted.  5. Follow-up: in 7 weeks to follow his progress.  15  minutes was spent with the patient face-to-face today.  More than 50% of time was dedicated to discussing the natural course of this disease, treatment options and complications related to this therapy.   Zola Button, MD 8/14/20198:57 AM

## 2018-05-22 NOTE — Telephone Encounter (Signed)
Appts scheduled AVS/Calendar printed per 8/14 los °

## 2018-05-29 DIAGNOSIS — Z7901 Long term (current) use of anticoagulants: Secondary | ICD-10-CM | POA: Diagnosis not present

## 2018-06-19 DIAGNOSIS — Z23 Encounter for immunization: Secondary | ICD-10-CM | POA: Diagnosis not present

## 2018-06-21 DIAGNOSIS — E782 Mixed hyperlipidemia: Secondary | ICD-10-CM | POA: Diagnosis not present

## 2018-06-21 DIAGNOSIS — G459 Transient cerebral ischemic attack, unspecified: Secondary | ICD-10-CM | POA: Diagnosis not present

## 2018-06-21 DIAGNOSIS — I10 Essential (primary) hypertension: Secondary | ICD-10-CM | POA: Diagnosis not present

## 2018-06-21 DIAGNOSIS — Z8546 Personal history of malignant neoplasm of prostate: Secondary | ICD-10-CM | POA: Diagnosis not present

## 2018-06-27 DIAGNOSIS — E039 Hypothyroidism, unspecified: Secondary | ICD-10-CM | POA: Diagnosis not present

## 2018-06-27 DIAGNOSIS — Z7901 Long term (current) use of anticoagulants: Secondary | ICD-10-CM | POA: Diagnosis not present

## 2018-07-15 ENCOUNTER — Inpatient Hospital Stay: Payer: Medicare Other | Attending: Oncology

## 2018-07-15 ENCOUNTER — Telehealth: Payer: Self-pay | Admitting: Oncology

## 2018-07-15 ENCOUNTER — Inpatient Hospital Stay (HOSPITAL_BASED_OUTPATIENT_CLINIC_OR_DEPARTMENT_OTHER): Payer: Medicare Other | Admitting: Oncology

## 2018-07-15 VITALS — BP 97/72 | HR 56 | Temp 97.9°F | Resp 17 | Ht 71.0 in | Wt 181.8 lb

## 2018-07-15 DIAGNOSIS — Z79899 Other long term (current) drug therapy: Secondary | ICD-10-CM

## 2018-07-15 DIAGNOSIS — D45 Polycythemia vera: Secondary | ICD-10-CM | POA: Diagnosis not present

## 2018-07-15 DIAGNOSIS — Z7901 Long term (current) use of anticoagulants: Secondary | ICD-10-CM | POA: Diagnosis not present

## 2018-07-15 DIAGNOSIS — Z7982 Long term (current) use of aspirin: Secondary | ICD-10-CM | POA: Insufficient documentation

## 2018-07-15 DIAGNOSIS — Z8673 Personal history of transient ischemic attack (TIA), and cerebral infarction without residual deficits: Secondary | ICD-10-CM

## 2018-07-15 DIAGNOSIS — Z86718 Personal history of other venous thrombosis and embolism: Secondary | ICD-10-CM | POA: Insufficient documentation

## 2018-07-15 LAB — CBC WITH DIFFERENTIAL (CANCER CENTER ONLY)
Basophils Absolute: 0.3 10*3/uL — ABNORMAL HIGH (ref 0.0–0.1)
Basophils Relative: 2 %
EOS PCT: 3 %
Eosinophils Absolute: 0.4 10*3/uL (ref 0.0–0.5)
HCT: 49.7 % (ref 38.4–49.9)
Hemoglobin: 16 g/dL (ref 13.0–17.1)
LYMPHS ABS: 1.7 10*3/uL (ref 0.9–3.3)
LYMPHS PCT: 13 %
MCH: 30 pg (ref 27.2–33.4)
MCHC: 32.2 g/dL (ref 32.0–36.0)
MCV: 93.1 fL (ref 79.3–98.0)
MONOS PCT: 12 %
Monocytes Absolute: 1.6 10*3/uL — ABNORMAL HIGH (ref 0.1–0.9)
Neutro Abs: 9.2 10*3/uL — ABNORMAL HIGH (ref 1.5–6.5)
Neutrophils Relative %: 70 %
PLATELETS: 220 10*3/uL (ref 140–400)
RBC: 5.34 MIL/uL (ref 4.20–5.82)
RDW: 25.6 % — ABNORMAL HIGH (ref 11.0–14.6)
WBC Count: 13.1 10*3/uL — ABNORMAL HIGH (ref 4.0–10.3)

## 2018-07-15 LAB — CMP (CANCER CENTER ONLY)
ALBUMIN: 4.1 g/dL (ref 3.5–5.0)
ALT: 16 U/L (ref 0–44)
AST: 22 U/L (ref 15–41)
Alkaline Phosphatase: 81 U/L (ref 38–126)
Anion gap: 7 (ref 5–15)
BUN: 16 mg/dL (ref 8–23)
CHLORIDE: 107 mmol/L (ref 98–111)
CO2: 27 mmol/L (ref 22–32)
CREATININE: 1.27 mg/dL — AB (ref 0.61–1.24)
Calcium: 9.2 mg/dL (ref 8.9–10.3)
GFR, Est AFR Am: >60 mL/min (ref >60)
GFR, Estimated: 54 mL/min — ABNORMAL LOW (ref >60)
GLUCOSE: 69 mg/dL — AB (ref 70–99)
POTASSIUM: 4.2 mmol/L (ref 3.5–5.1)
Sodium: 141 mmol/L (ref 135–145)
Total Bilirubin: 0.6 mg/dL (ref 0.3–1.2)
Total Protein: 6.9 g/dL (ref 6.5–8.1)

## 2018-07-15 NOTE — Telephone Encounter (Signed)
Appts scheduled avs/calendar printed per 10/7 los °

## 2018-07-15 NOTE — Progress Notes (Signed)
Hematology and Oncology Follow Up Visit  Gregory Blankenship [id number] 11-12-1940 77 y.o. 07/15/2018 9:07 AM   Principle Diagnosis: 77 year old man with JAK2 positive polycythemia vera diagnosed in February 2013.    Secondary diagnosis:   Prostate cancer diagnosed in 2011. He was found to have a Gleason score 3+4 = 7 after a prostatectomy completed on 06/20/2010. The final pathology showed a stage T2c disease.   Current therapy:   Phlebotomy to keep his hematocrit less than 45.  Hydroxyurea 500 mg twice a day started in June 2019.  Interim History: Gregory Blankenship returns today for a follow-up.  Since last visit, he reports no major changes in his health at this time.  He continues to tolerate hydroxyurea without any recent illnesses or bleeding.  He denies any excessive fatigue or tiredness.  He denies any recent hospitalizations.  He denies any neurological deficits.   He denied any headaches, blurry vision or syncope or seizures.   He does not report any fevers, chills, sweats.  He does not report any chest pain, palpitation, orthopnea or leg edema. He does not report any cough, wheezing or hemoptysis. Does not report any changes in bowel habits. He does not report any frequency urgency or hesitancy. He does not report any constitutional symptoms.  Denies any arthralgias or myalgias.  He denies any lymphadenopathy or petechiae.  He denies any heat or cold intolerance.  Rest of his review of systems is negative.  Medications: I have reviewed the patient's current medications. Current Outpatient Medications  Medication Sig Dispense Refill  . acetaminophen (TYLENOL) 325 MG tablet Take 325 mg by mouth every 6 (six) hours as needed for mild pain.     Marland Kitchen aspirin 81 MG tablet Take 81 mg by mouth every evening.     . carvedilol (COREG) 6.25 MG tablet TAKE 1 TABLET BY MOUTH TWICE DAILY 180 tablet 2  . colchicine (COLCRYS) 0.6 MG tablet Take 0.6 mg daily by mouth.    . hydroxyurea (HYDREA) 500 MG  capsule Take 1 capsule (500 mg total) by mouth 2 (two) times daily. May take with food to minimize GI side effects. 90 capsule 3  . isosorbide mononitrate (IMDUR) 30 MG 24 hr tablet TAKE 1 TABLET BY MOUTH ONCE DAILY 90 tablet 2  . levothyroxine (SYNTHROID, LEVOTHROID) 88 MCG tablet Take 88 mcg by mouth in the morning  2  . NITROSTAT 0.4 MG SL tablet PLACE 1 TABELT UNDER THE TONGUE EVERY 5 MINUTES AS NEEDED FOR CHEST PAIN. 25 tablet 3  . simvastatin (ZOCOR) 20 MG tablet Take 20 mg every evening by mouth.     . warfarin (COUMADIN) 5 MG tablet Take 5 mg See admin instructions by mouth. In the evening on Mon/Wed/Fri     No current facility-administered medications for this visit.     Allergies:  Allergies  Allergen Reactions  . Erythromycin Nausea Only    Past Medical History, Surgical history, Social history, and Family History were reviewed and updated.   Physical Exam:  Blood pressure 97/72, pulse (!) 56, temperature 97.9 F (36.6 C), temperature source Oral, resp. rate 17, height 5\' 11"  (1.803 m), weight 181 lb 12.8 oz (82.5 kg), SpO2 98 %.    ECOG: 1   General appearance: Comfortable appearing without any discomfort Head: Normocephalic without any trauma Oropharynx: Mucous membranes are moist and pink without any thrush or ulcers. Eyes: Pupils are equal and round reactive to light. Lymph nodes: No cervical, supraclavicular, inguinal or axillary lymphadenopathy.  Heart:regular rate and rhythm.  S1 and S2 without leg edema. Lung: Clear without any rhonchi or wheezes.  No dullness to percussion. Abdomin: Soft, nontender, nondistended with good bowel sounds.  No hepatosplenomegaly. Musculoskeletal: No joint deformity or effusion.  Full range of motion noted. Neurological: No deficits noted on motor, sensory and deep tendon reflex exam. Skin: No petechial rash or dryness.  Appeared moist.  Psychiatric: Mood and affect appeared appropriate.    Lab Results: Lab Results   Component Value Date   WBC 13.1 (H) 07/15/2018   HGB 16.0 07/15/2018   HCT 49.7 07/15/2018   MCV 93.1 07/15/2018   PLT 220 07/15/2018     Chemistry      Component Value Date/Time   NA 137 05/22/2018 0849   NA 140 05/09/2017 0844   K 4.4 05/22/2018 0849   K 4.3 05/09/2017 0844   CL 103 05/22/2018 0849   CL 102 02/05/2013 1429   CO2 25 05/22/2018 0849   CO2 25 05/09/2017 0844   BUN 20 05/22/2018 0849   BUN 16.7 05/09/2017 0844   CREATININE 1.25 (H) 05/22/2018 0849   CREATININE 1.2 05/09/2017 0844      Component Value Date/Time   CALCIUM 8.8 (L) 05/22/2018 0849   CALCIUM 9.4 05/09/2017 0844   ALKPHOS 93 05/22/2018 0849   ALKPHOS 99 05/09/2017 0844   AST 22 05/22/2018 0849   AST 49 (H) 05/09/2017 0844   ALT 19 05/22/2018 0849   ALT 41 05/09/2017 0844   BILITOT 0.7 05/22/2018 0849   BILITOT 0.55 05/09/2017 0844      Impression and Plan:  77 year old man with:  1.  Jak 2+ polycythemia vera presented with elevated white cell count, hemoglobin and thrombocytosis in 2013.  He is currently on hydroxyurea as well as intermittent phlebotomy.  His hemoglobin continues to be under excellent control and does not require phlebotomy at this time.  Risks and benefits of continuing this medication was reviewed today is agreeable to continue.  See no need for any increase in his dose.  Long-term complication associated with hydroxyurea was also discussed which include bone marrow disease and secondary malignancy.   2. Thrombosis prophylaxis: No recent thrombosis episodes noted.  He remains on full dose anticoagulation.  3. Leukocytosis and thrombocytosis: His white cell count and platelet count were reviewed today and showed close to normal range.   4.  Ischemic stroke in November 2018.  No new deficits noted.  5. Follow-up: in 2 months for repeat laboratory testing.  15  minutes was spent with the patient face-to-face today.  More than 50% of time was dedicated to reviewing his  laboratory data, risks and benefits of treatment and long-term complication associated with therapy.   Zola Button, MD 10/7/20199:07 AM

## 2018-07-30 DIAGNOSIS — Z7901 Long term (current) use of anticoagulants: Secondary | ICD-10-CM | POA: Diagnosis not present

## 2018-08-07 ENCOUNTER — Encounter: Payer: Self-pay | Admitting: Cardiovascular Disease

## 2018-08-07 ENCOUNTER — Ambulatory Visit: Payer: Medicare Other | Admitting: Cardiovascular Disease

## 2018-08-07 VITALS — BP 100/74 | HR 53 | Ht 72.0 in | Wt 182.4 lb

## 2018-08-07 DIAGNOSIS — I255 Ischemic cardiomyopathy: Secondary | ICD-10-CM

## 2018-08-07 DIAGNOSIS — I2102 ST elevation (STEMI) myocardial infarction involving left anterior descending coronary artery: Secondary | ICD-10-CM | POA: Diagnosis not present

## 2018-08-07 DIAGNOSIS — E7800 Pure hypercholesterolemia, unspecified: Secondary | ICD-10-CM | POA: Diagnosis not present

## 2018-08-07 DIAGNOSIS — I10 Essential (primary) hypertension: Secondary | ICD-10-CM

## 2018-08-07 NOTE — Progress Notes (Signed)
08/07/2018 [name]   1941-03-15  [id number]  Primary Physician Seward Carol, MD Primary Cardiologist: Lorretta Harp MD FACP, [place], [place], [place]  HPI:  Rizwan Kuyper. is a 77 y.o.  thin and fit-appearing, married Caucasian male, father of 57, grandfather to 5 grandchildren who I last saw in the office  08/16/2016. He has a history of an anterior ST-segment-elevation myocardial infarction December 02, 2011. He was on Coumadin anticoagulation because of prior pulmonary embolus. I cath'd him radially revealing a left dominant system with an occluded mid LAD. I tried to percutaneously recanalize him. However, this was unsuccessful probably because this was a "CTO." He did have anterior wall motion abnormalities and EF of 30% and anteroapical dyskinesia which ultimately improved over time to an EF of 50% by 2D echo in June of 2014. He is completely asymptomatic. He participated in cardiac rehab. His lipid profile was excellent 08/10/15 with a total cholesterol of 133, LDL 85 and HDL of 31. Since I saw him one year ago he denies chest pain or shortness of breath. He has a daughter who lives in [place] who he visits several times a year.   Since I saw him in the office 2 years ago he is doing well.  He did see Almyra Deforest  PA-C a year ago for preoperative clearance before elective laparoscopic hernia repair.  He is very active and walks upstairs and exercises without limitation.  He had no complications from his hernia repair although he did have some bleeding afterwards and was seen in the ER because of this.  He remains on Coumadin anticoagulation with INRs followed by his PCP.   Current Meds  Medication Sig  . acetaminophen (TYLENOL) 325 MG tablet Take 325 mg by mouth every 6 (six) hours as needed for mild pain.   Marland Kitchen aspirin 81 MG tablet Take 81 mg by mouth every evening.   . carvedilol (COREG) 6.25 MG tablet TAKE 1 TABLET BY MOUTH TWICE DAILY  . hydroxyurea (HYDREA) 500 MG capsule  Take 1 capsule (500 mg total) by mouth 2 (two) times daily. May take with food to minimize GI side effects.  . isosorbide mononitrate (IMDUR) 30 MG 24 hr tablet TAKE 1 TABLET BY MOUTH ONCE DAILY  . levothyroxine (SYNTHROID, LEVOTHROID) 88 MCG tablet Take 88 mcg by mouth in the morning  . NITROSTAT 0.4 MG SL tablet PLACE 1 TABELT UNDER THE TONGUE EVERY 5 MINUTES AS NEEDED FOR CHEST PAIN.  . simvastatin (ZOCOR) 20 MG tablet Take 20 mg every evening by mouth.   . warfarin (COUMADIN) 5 MG tablet Take 5 mg See admin instructions by mouth. In the evening on Mon/Wed/Fri     Allergies  Allergen Reactions  . Erythromycin Nausea Only    Social History   Socioeconomic History  . Marital status: Married    Spouse name: [name]  . Number of children: 2  . Years of education: college  . Highest education level: Not on file  Occupational History  . Not on file  Social Needs  . Financial resource strain: Not on file  . Food insecurity:    Worry: Not on file    Inability: Not on file  . Transportation needs:    Medical: Not on file    Non-medical: Not on file  Tobacco Use  . Smoking status: Former Smoker    Last attempt to quit: 12/01/1968    Years since quitting: 49.7  . Smokeless tobacco:  Never Used  Substance and Sexual Activity  . Alcohol use: No    Comment: hx of 5-6 years ago an occasional beer or wine  . Drug use: No  . Sexual activity: Not on file  Lifestyle  . Physical activity:    Days per week: Not on file    Minutes per session: Not on file  . Stress: Not on file  Relationships  . Social connections:    Talks on phone: Not on file    Gets together: Not on file    Attends religious service: Not on file    Active member of club or organization: Not on file    Attends meetings of clubs or organizations: Not on file    Relationship status: Not on file  . Intimate partner violence:    Fear of current or ex partner: Not on file    Emotionally abused: Not on file     Physically abused: Not on file    Forced sexual activity: Not on file  Other Topics Concern  . Not on file  Social History Narrative   Lives with wife, [name]   Caffeine use: very little   Right handed      Review of Systems: General: negative for chills, fever, night sweats or weight changes.  Cardiovascular: negative for chest pain, dyspnea on exertion, edema, orthopnea, palpitations, paroxysmal nocturnal dyspnea or shortness of breath Dermatological: negative for rash Respiratory: negative for cough or wheezing Urologic: negative for hematuria Abdominal: negative for nausea, vomiting, diarrhea, bright red blood per rectum, melena, or hematemesis Neurologic: negative for visual changes, syncope, or dizziness All other systems reviewed and are otherwise negative except as noted above.    Blood pressure 100/74, pulse (!) 53, height 6' (1.829 m), weight 182 lb 6.4 oz (82.7 kg).  General appearance: alert and no distress Neck: no adenopathy, no carotid bruit, no JVD, supple, symmetrical, trachea midline and thyroid not enlarged, symmetric, no tenderness/mass/nodules Lungs: clear to auscultation bilaterally Heart: regular rate and rhythm, S1, S2 normal, no murmur, click, rub or gallop Extremities: extremities normal, atraumatic, no cyanosis or edema Pulses: 2+ and symmetric Skin: Skin color, texture, turgor normal. No rashes or lesions Neurologic: Alert and oriented X 3, normal strength and tone. Normal symmetric reflexes. Normal coordination and gait  EKG sinus bradycardia 53 with anteroseptal Q waves.  Personally reviewed this EKG.  ASSESSMENT AND PLAN:   Pulmonary embolism, 9/11 after prostate surgery History of remote pulmonary embolism after prostate surgery surgery on chronic Coumadin anticoagulation followed by Dr. Delfina Redwood.  STEMI (ST elevation myocardial infarction) History of CAD status post anterior STEMI 12/02/2011.  I cath him radially revealing an occluded LAD which I  attempted to recanalize unsuccessfully.  It behaved like a "CTO".  He did have an anterior wall motion of normality initially with an EF of 30% with an anteroapical wall motion abnormality which came up to 50% by 2D echo June 2014 and is settled down into the 45% range by recent 2D echo 09/20/2017.  He is completely asymptomatic.  Cardiomyopathy, ischemic, EF 35-40% by 2D 2/26 She of ischemic heart myopathy with an EF of 40 to 45% range on carvedilol and isosorbide.  He is completely asymptomatic, class I.  Essential hypertension History of essential hypertension her blood pressure measured today at 100/74.  He is on carvedilol.  Continue current meds at current dosing.  Hyperlipidemia History of hyperlipidemia on statin therapy with recent lipid profile performed 04/12/2018 revealing total cholesterol of 116, LDL  64 and HDL of [place] MD [facility], [facility] 08/07/2018 8:20 AM

## 2018-08-07 NOTE — Assessment & Plan Note (Signed)
History of hyperlipidemia on statin therapy with recent lipid profile performed 04/12/2018 revealing total cholesterol of 116, LDL 64 and HDL of 37.

## 2018-08-07 NOTE — Assessment & Plan Note (Signed)
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  I cath him radially revealing an occluded LAD which I attempted to recanalize unsuccessfully.  It behaved like a "CTO".  He did have an anterior wall motion of normality initially with an EF of 30% with an anteroapical wall motion abnormality which came up to 50% by 2D echo June 2014 and is settled down into the 45% range by recent 2D echo 09/20/2017.  He is completely asymptomatic.

## 2018-08-07 NOTE — Assessment & Plan Note (Signed)
She of ischemic heart myopathy with an EF of 40 to 45% range on carvedilol and isosorbide.  He is completely asymptomatic, class I.

## 2018-08-07 NOTE — Assessment & Plan Note (Signed)
History of essential hypertension her blood pressure measured today at 100/74.  He is on carvedilol.  Continue current meds at current dosing.

## 2018-08-07 NOTE — Assessment & Plan Note (Signed)
History of remote pulmonary embolism after prostate surgery surgery on chronic Coumadin anticoagulation followed by Dr. Delfina Redwood.

## 2018-08-07 NOTE — Patient Instructions (Signed)

## 2018-08-27 DIAGNOSIS — Z7901 Long term (current) use of anticoagulants: Secondary | ICD-10-CM | POA: Diagnosis not present

## 2018-08-29 IMAGING — CT CT HEAD W/O CM
3 of 4 series · 16 of 47 positions shown, 19 images · non-contrast
Comparison: None.

CLINICAL DATA: Dizziness.

EXAM:
CT HEAD WITHOUT CONTRAST
TECHNIQUE: Contiguous axial images were obtained from the base of the skull
through the vertex without intravenous contrast.

[Series 32: 3d filtered head w/o · axial · non-contrast · 0.44mm/px · z∈[+25,+170]mm · 10 of 35 slices shown, 13 images]
[im 3/35  brain]
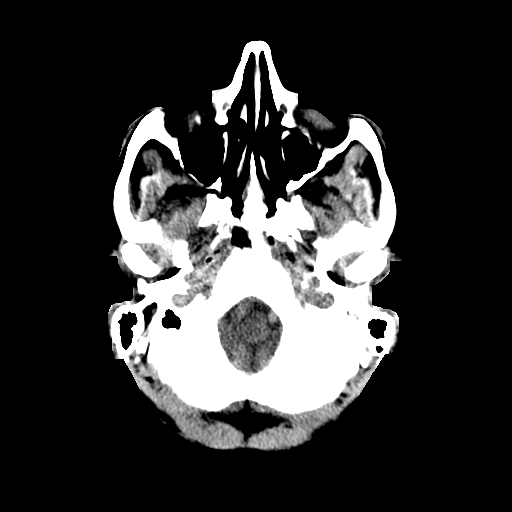
[im 3/35  bone]
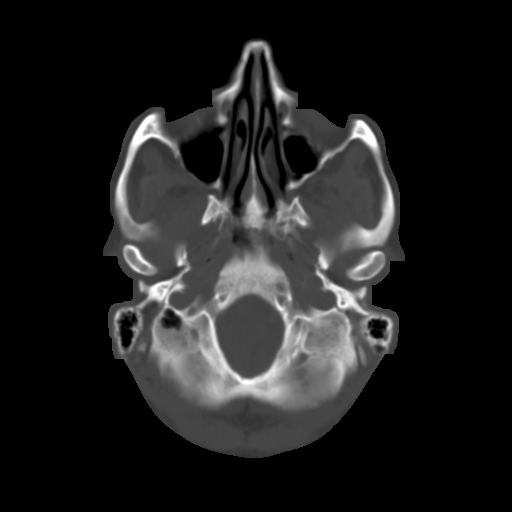
[im 5/35  brain]
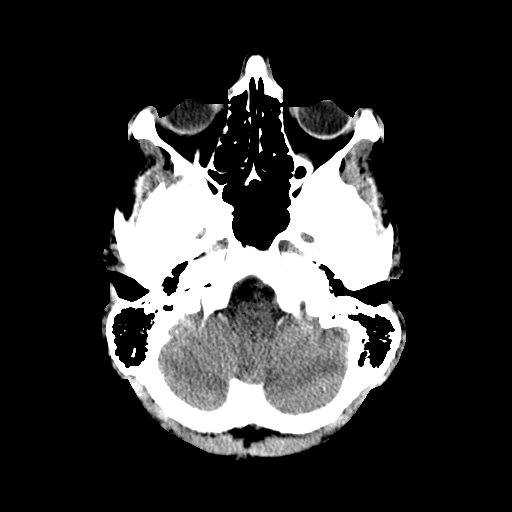
[im 10/35  brain]
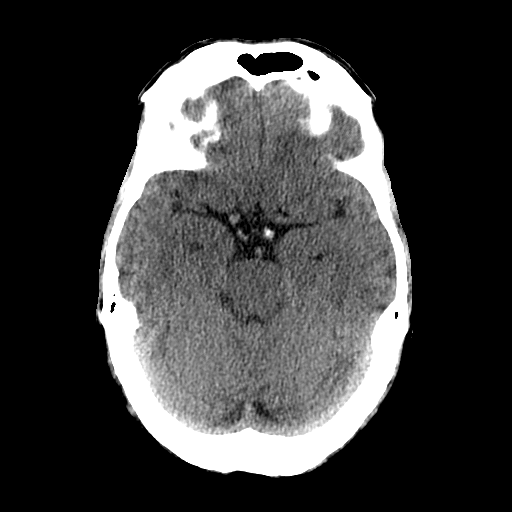
[im 13/35  brain]
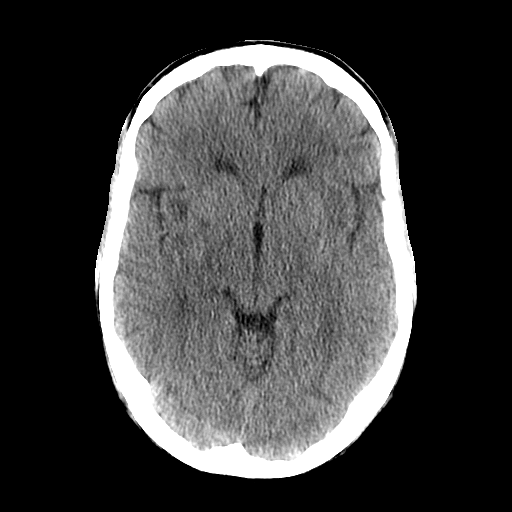
[im 15/35  brain]
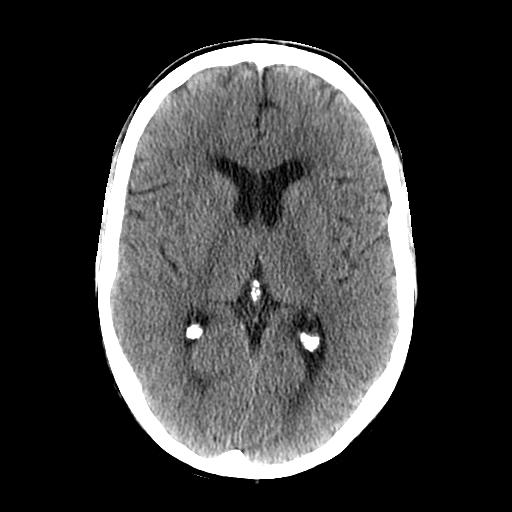
[im 15/35  bone]
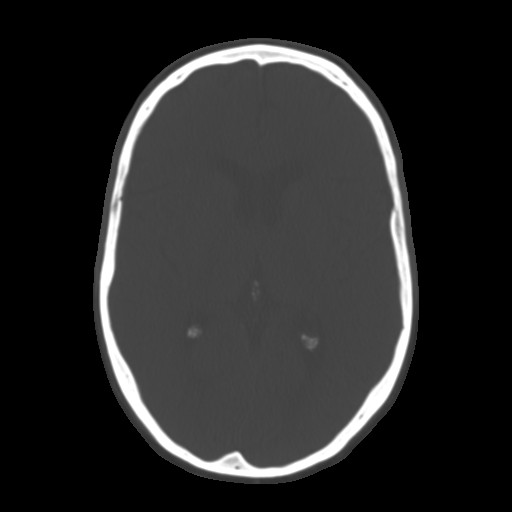
[im 20/35  brain]
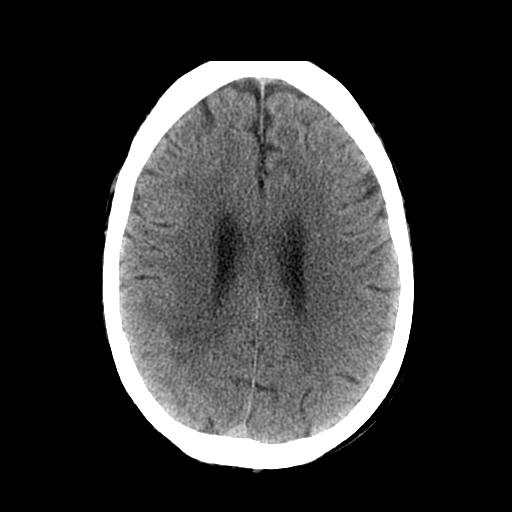
[im 22/35  brain]
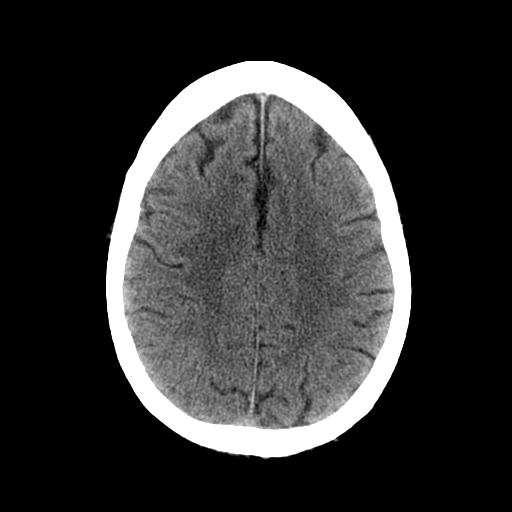
[im 25/35  brain]
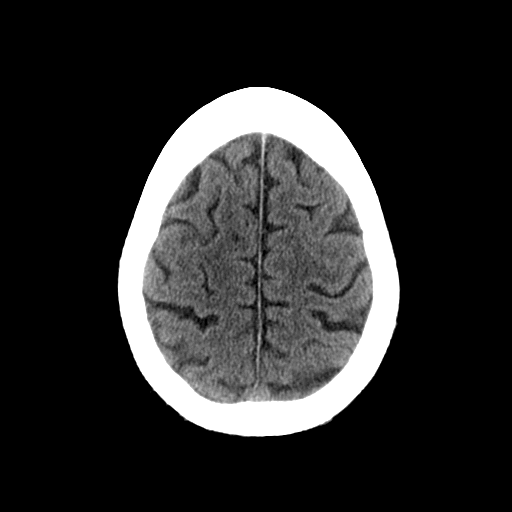
[im 30/35  brain]
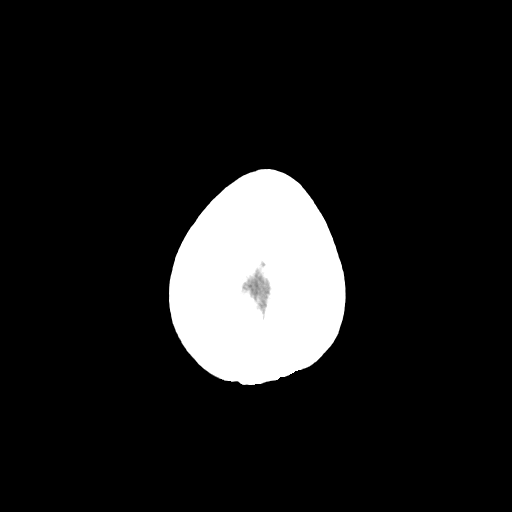
[im 30/35  bone]
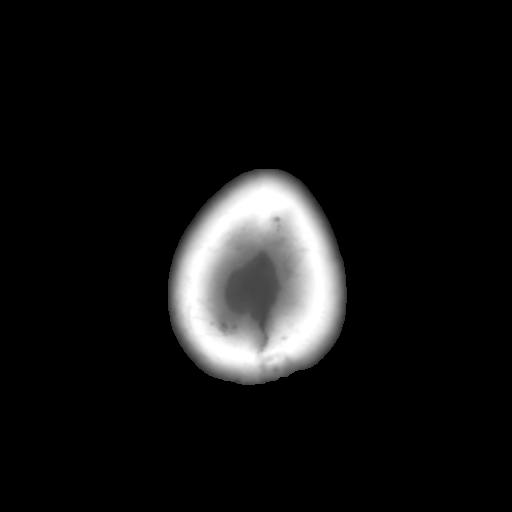
[im 32/35  brain]
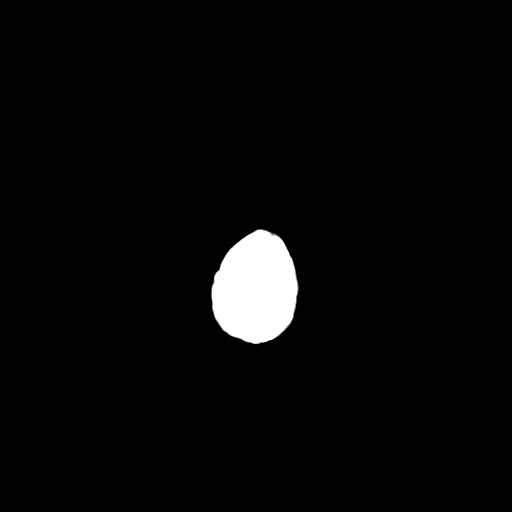

[Series 601: coronal brain · coronal · 0.44mm/px · 3 of 70 slices shown]
[im 24/70  brain]
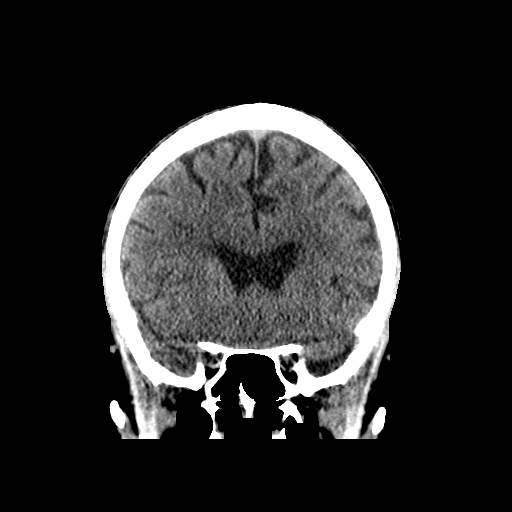
[im 31/70  brain]
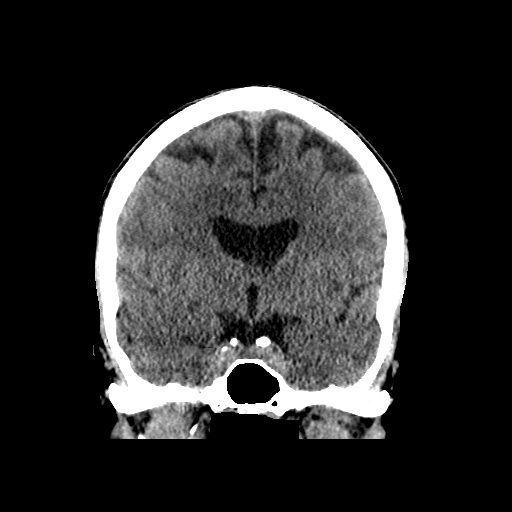
[im 39/70  brain]
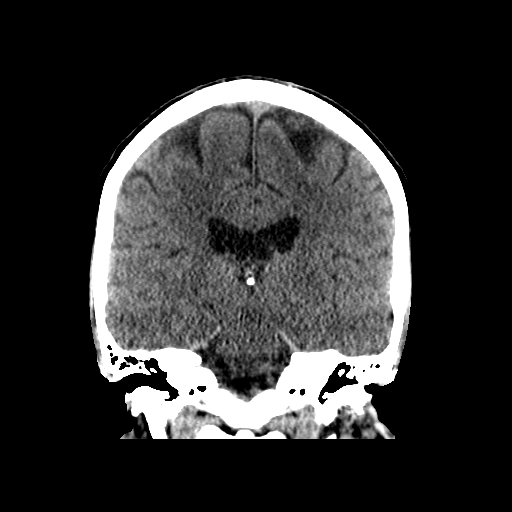

[Series 602: sagittal brain · sagittal · 0.44mm/px · 3 of 62 slices shown]
[im 21/62  brain]
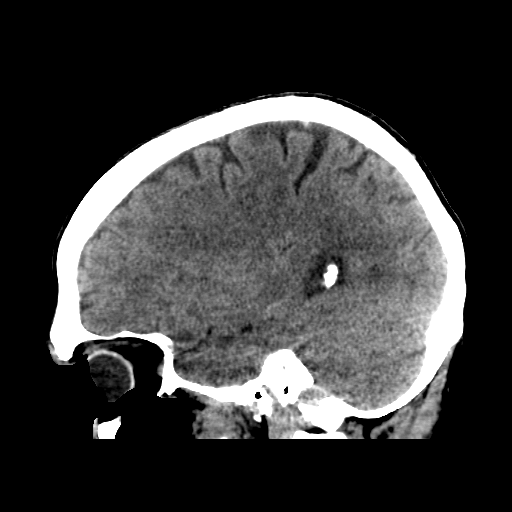
[im 31/62  brain]
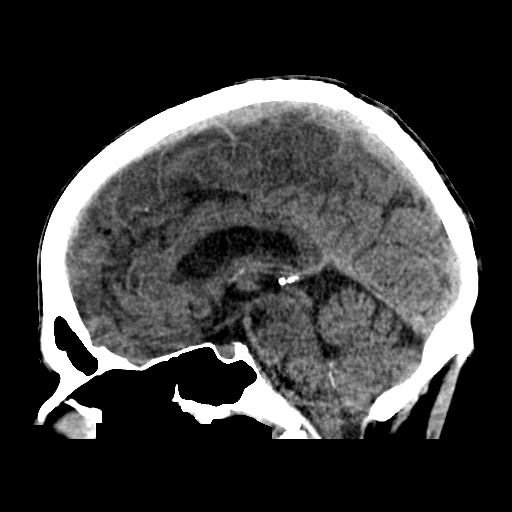
[im 41/62  brain]
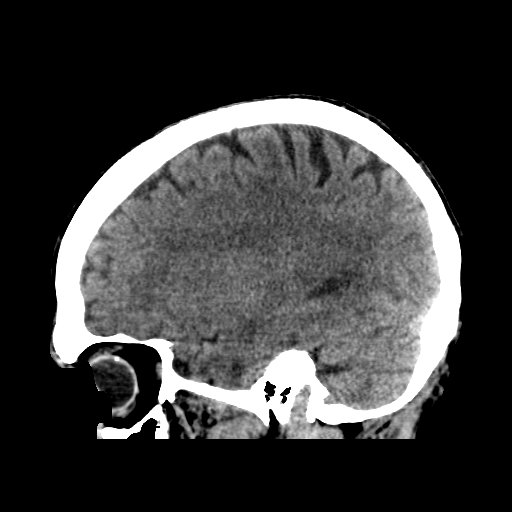

[16 of 47 positions shown; findings below may reference images not displayed]

FINDINGS: Brain: Mild chronic ischemic white matter disease is noted. No mass
effect or midline shift is noted. Ventricular size is within normal
limits. There is no evidence of mass lesion, hemorrhage or acute
infarction.

Vascular: No hyperdense vessel or unexpected calcification.

Skull: Normal. Negative for fracture or focal lesion.

Sinuses/Orbits: No acute finding.

Other: None.
IMPRESSION: Mild chronic ischemic white matter disease. No acute intracranial
abnormality seen.

## 2018-09-09 ENCOUNTER — Other Ambulatory Visit: Payer: Self-pay | Admitting: Cardiology

## 2018-09-09 DIAGNOSIS — Z85820 Personal history of malignant melanoma of skin: Secondary | ICD-10-CM | POA: Diagnosis not present

## 2018-09-09 DIAGNOSIS — L570 Actinic keratosis: Secondary | ICD-10-CM | POA: Diagnosis not present

## 2018-09-09 DIAGNOSIS — Z85828 Personal history of other malignant neoplasm of skin: Secondary | ICD-10-CM | POA: Diagnosis not present

## 2018-09-09 DIAGNOSIS — D485 Neoplasm of uncertain behavior of skin: Secondary | ICD-10-CM | POA: Diagnosis not present

## 2018-09-10 DIAGNOSIS — I2699 Other pulmonary embolism without acute cor pulmonale: Secondary | ICD-10-CM | POA: Diagnosis not present

## 2018-09-10 DIAGNOSIS — N183 Chronic kidney disease, stage 3 (moderate): Secondary | ICD-10-CM | POA: Diagnosis not present

## 2018-09-10 DIAGNOSIS — I10 Essential (primary) hypertension: Secondary | ICD-10-CM | POA: Diagnosis not present

## 2018-09-10 DIAGNOSIS — M109 Gout, unspecified: Secondary | ICD-10-CM | POA: Diagnosis not present

## 2018-09-10 DIAGNOSIS — E7800 Pure hypercholesterolemia, unspecified: Secondary | ICD-10-CM | POA: Diagnosis not present

## 2018-09-11 ENCOUNTER — Telehealth: Payer: Self-pay | Admitting: Oncology

## 2018-09-11 ENCOUNTER — Inpatient Hospital Stay: Payer: Medicare Other | Attending: Oncology

## 2018-09-11 ENCOUNTER — Inpatient Hospital Stay (HOSPITAL_BASED_OUTPATIENT_CLINIC_OR_DEPARTMENT_OTHER): Payer: Medicare Other | Admitting: Oncology

## 2018-09-11 VITALS — BP 110/80 | HR 79 | Temp 97.8°F | Resp 18 | Ht 72.0 in | Wt 185.0 lb

## 2018-09-11 DIAGNOSIS — D72829 Elevated white blood cell count, unspecified: Secondary | ICD-10-CM | POA: Diagnosis not present

## 2018-09-11 DIAGNOSIS — Z7901 Long term (current) use of anticoagulants: Secondary | ICD-10-CM | POA: Insufficient documentation

## 2018-09-11 DIAGNOSIS — Z79899 Other long term (current) drug therapy: Secondary | ICD-10-CM | POA: Diagnosis not present

## 2018-09-11 DIAGNOSIS — Z8673 Personal history of transient ischemic attack (TIA), and cerebral infarction without residual deficits: Secondary | ICD-10-CM

## 2018-09-11 DIAGNOSIS — D45 Polycythemia vera: Secondary | ICD-10-CM | POA: Diagnosis not present

## 2018-09-11 DIAGNOSIS — Z7982 Long term (current) use of aspirin: Secondary | ICD-10-CM | POA: Insufficient documentation

## 2018-09-11 LAB — CBC WITH DIFFERENTIAL (CANCER CENTER ONLY)
ABS IMMATURE GRANULOCYTES: 1.06 10*3/uL — AB (ref 0.00–0.07)
BASOS PCT: 2 %
Basophils Absolute: 0.5 10*3/uL — ABNORMAL HIGH (ref 0.0–0.1)
Eosinophils Absolute: 0.5 10*3/uL (ref 0.0–0.5)
Eosinophils Relative: 2 %
HCT: 49.6 % (ref 39.0–52.0)
HEMOGLOBIN: 15.7 g/dL (ref 13.0–17.0)
Immature Granulocytes: 5 %
LYMPHS PCT: 9 %
Lymphs Abs: 2.2 10*3/uL (ref 0.7–4.0)
MCH: 29.3 pg (ref 26.0–34.0)
MCHC: 31.7 g/dL (ref 30.0–36.0)
MCV: 92.5 fL (ref 80.0–100.0)
Monocytes Absolute: 2.4 10*3/uL — ABNORMAL HIGH (ref 0.1–1.0)
Monocytes Relative: 10 %
NEUTROS ABS: 17.1 10*3/uL — AB (ref 1.7–7.7)
NEUTROS PCT: 72 %
PLATELETS: 406 10*3/uL — AB (ref 150–400)
RBC: 5.36 MIL/uL (ref 4.22–5.81)
RDW: 16.1 % — ABNORMAL HIGH (ref 11.5–15.5)
WBC: 23.8 10*3/uL — AB (ref 4.0–10.5)
nRBC: 0 % (ref 0.0–0.2)

## 2018-09-11 LAB — CMP (CANCER CENTER ONLY)
ALBUMIN: 4.2 g/dL (ref 3.5–5.0)
ALK PHOS: 92 U/L (ref 38–126)
ALT: 18 U/L (ref 0–44)
ANION GAP: 8 (ref 5–15)
AST: 24 U/L (ref 15–41)
BUN: 16 mg/dL (ref 8–23)
CALCIUM: 9.2 mg/dL (ref 8.9–10.3)
CO2: 27 mmol/L (ref 22–32)
CREATININE: 1.35 mg/dL — AB (ref 0.61–1.24)
Chloride: 106 mmol/L (ref 98–111)
GFR, Est AFR Am: 59 mL/min — ABNORMAL LOW (ref >60)
GFR, Estimated: 51 mL/min — ABNORMAL LOW (ref >60)
GLUCOSE: 90 mg/dL (ref 70–99)
Potassium: 4.4 mmol/L (ref 3.5–5.1)
Sodium: 141 mmol/L (ref 135–145)
Total Bilirubin: 0.7 mg/dL (ref 0.3–1.2)
Total Protein: 7 g/dL (ref 6.5–8.1)

## 2018-09-11 NOTE — Telephone Encounter (Signed)
Printed calendar and avs. °

## 2018-09-11 NOTE — Addendum Note (Signed)
Addended by: Randolm Idol on: 09/11/2018 11:26 AM   Modules accepted: Orders

## 2018-09-11 NOTE — Progress Notes (Signed)
Hematology and Oncology Follow Up Visit  Gregory Blankenship [id number] 1941/02/15 77 y.o. 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  Secondary diagnosis:   Prostate cancer diagnosed in 2011. He was found to have a Gleason score 3+4 = 7 after a prostatectomy completed on 06/20/2010. The final pathology showed a stage T2c disease.   Current therapy:   Phlebotomy to keep his hematocrit less than 45.  Hydroxyurea 500 mg twice a day started in June 2019.  The dose was reduced to 500 mg daily in August 2019.  Interim History: Gregory Blankenship resents today for a follow-up.  Since last visit, he reports no major changes in his health.  He continues to tolerate hydroxyurea without any recent complaints.  He denies any recent thrombosis or bleeding episodes.  He denies any skin rashes or lesions.  He denies any oral ulcers or thrush.  He continues to be active and planning a trip to [place].   He denied any headaches, blurry vision or syncope or seizures.   He denies any alteration mental status or confusion.  He does not report any fevers, chills, sweats.  He does not report any chest pain, palpitation, orthopnea or leg edema. He does not report any cough, wheezing or hemoptysis. Does not report any nausea, vomiting or abdominal distention.  Denies any changes in bowel habits.  He does not report any frequency urgency or hesitancy.  Denies any bone pain or pathological fractures.  He denies any skin rashes or lesions.  He denies any mood changes.  Rest of his review of systems is negative.  Medications: I have reviewed the patient's current medications. Current Outpatient Medications  Medication Sig Dispense Refill  . acetaminophen (TYLENOL) 325 MG tablet Take 325 mg by mouth every 6 (six) hours as needed for mild pain.     Marland Kitchen aspirin 81 MG tablet Take 81 mg by mouth  every evening.     . carvedilol (COREG) 6.25 MG tablet Take 1 tablet (6.25 mg total) by mouth 2 (two) times daily. 180 tablet 2  . colchicine (COLCRYS) 0.6 MG tablet Take 0.6 mg daily by mouth.    . hydroxyurea (HYDREA) 500 MG capsule Take 1 capsule (500 mg total) by mouth 2 (two) times daily. May take with food to minimize GI side effects. 90 capsule 3  . isosorbide mononitrate (IMDUR) 30 MG 24 hr tablet TAKE 1 TABLET BY MOUTH ONCE DAILY 90 tablet 2  . levothyroxine (SYNTHROID, LEVOTHROID) 88 MCG tablet Take 88 mcg by mouth in the morning  2  . NITROSTAT 0.4 MG SL tablet PLACE 1 TABELT UNDER THE TONGUE EVERY 5 MINUTES AS NEEDED FOR CHEST PAIN. 25 tablet 3  . simvastatin (ZOCOR) 20 MG tablet Take 20 mg every evening by mouth.     . warfarin (COUMADIN) 5 MG tablet Take 5 mg See admin instructions by mouth. In the evening on Mon/Wed/Fri     No current facility-administered medications for this visit.     Allergies:  Allergies  Allergen Reactions  . Erythromycin Nausea Only    Past Medical History, Surgical history, Social history, and Family History were reviewed and updated.   Physical Exam:   Blood pressure 110/80, pulse 79, temperature 97.8 F (36.6 C), temperature source Oral, resp. rate 18, height 6' (1.829 m), weight 185 lb (83.9 kg), SpO2 99 %.  ECOG: 1   General appearance: Alert, awake without any distress. Head: Atraumatic without abnormalities Oropharynx: Without any thrush or ulcers. Eyes: No scleral icterus. Lymph nodes: No lymphadenopathy noted in the cervical, supraclavicular, or axillary nodes Heart:regular rate and rhythm, without any murmurs or gallops.   Lung: Clear to auscultation without any rhonchi, wheezes or dullness to percussion. Abdomin: Soft, nontender without any shifting dullness or ascites. Musculoskeletal: No clubbing or cyanosis. Neurological: No motor or sensory deficits. Skin: No rashes or lesions.    Lab Results: Lab Results   Component Value Date   WBC 13.1 (H) 07/15/2018   HGB 16.0 07/15/2018   HCT 49.7 07/15/2018   MCV 93.1 07/15/2018   PLT 220 07/15/2018     Chemistry      Component Value Date/Time   NA 141 07/15/2018 0839   NA 140 05/09/2017 0844   K 4.2 07/15/2018 0839   K 4.3 05/09/2017 0844   CL 107 07/15/2018 0839   CL 102 02/05/2013 1429   CO2 27 07/15/2018 0839   CO2 25 05/09/2017 0844   BUN 16 07/15/2018 0839   BUN 16.7 05/09/2017 0844   CREATININE 1.27 (H) 07/15/2018 0839   CREATININE 1.2 05/09/2017 0844      Component Value Date/Time   CALCIUM 9.2 07/15/2018 0839   CALCIUM 9.4 05/09/2017 0844   ALKPHOS 81 07/15/2018 0839   ALKPHOS 99 05/09/2017 0844   AST 22 07/15/2018 0839   AST 49 (H) 05/09/2017 0844   ALT 16 07/15/2018 0839   ALT 41 05/09/2017 0844   BILITOT 0.6 07/15/2018 0839   BILITOT 0.55 05/09/2017 0844      Impression and Plan:  77 year old man with:  1.  Myeloproliferative disorder diagnosed in 2013.  He was found to have Jak 2+ mutation indicating polycythemia vera.  He has been receiving intermittent phlebotomy and hydroxyurea has been added since June 2019.  His hemoglobin remains under excellent control at this time and we will defer phlebotomy at this time unless his hematocrit is above 50.  We will recheck his counts in 2 months.   2. Thrombosis prophylaxis: he continues to be on full dose anticoagulation without any recent thrombosis episodes  3. Leukocytosis and thrombocytosis: Managed at this time with hydroxyurea but appears to be rising on 500 mg daily.  I have instructed him to increase the dose to 1000 mg once a week and continuing 500 mg the rest of the week.   4.  Ischemic stroke in November 2018.  No new episodes noted at this time without any residual neurological deficits.  5. Follow-up: in 2 months to follow his progress.  15  minutes was spent with the patient face-to-face today.  More than 50% of time was dedicated to obtaining his  disease status, treatment options and answering questions regarding future plan of care.    Zola Button, MD 12/4/20199:21 AM

## 2018-09-14 ENCOUNTER — Other Ambulatory Visit: Payer: Self-pay | Admitting: Cardiovascular Disease

## 2018-11-04 DIAGNOSIS — Z7901 Long term (current) use of anticoagulants: Secondary | ICD-10-CM | POA: Diagnosis not present

## 2018-11-11 DIAGNOSIS — Z7901 Long term (current) use of anticoagulants: Secondary | ICD-10-CM | POA: Diagnosis not present

## 2018-11-14 ENCOUNTER — Telehealth: Payer: Self-pay

## 2018-11-14 ENCOUNTER — Inpatient Hospital Stay: Payer: Medicare Other | Attending: Oncology

## 2018-11-14 ENCOUNTER — Inpatient Hospital Stay: Payer: Medicare Other | Admitting: Oncology

## 2018-11-14 ENCOUNTER — Inpatient Hospital Stay: Payer: Medicare Other

## 2018-11-14 VITALS — BP 138/78 | HR 60

## 2018-11-14 VITALS — BP 109/86 | HR 55 | Temp 98.0°F | Resp 17 | Ht 72.0 in | Wt 185.0 lb

## 2018-11-14 DIAGNOSIS — Z7982 Long term (current) use of aspirin: Secondary | ICD-10-CM | POA: Diagnosis not present

## 2018-11-14 DIAGNOSIS — D72829 Elevated white blood cell count, unspecified: Secondary | ICD-10-CM | POA: Diagnosis not present

## 2018-11-14 DIAGNOSIS — Z79899 Other long term (current) drug therapy: Secondary | ICD-10-CM | POA: Diagnosis not present

## 2018-11-14 DIAGNOSIS — Z8673 Personal history of transient ischemic attack (TIA), and cerebral infarction without residual deficits: Secondary | ICD-10-CM | POA: Diagnosis not present

## 2018-11-14 DIAGNOSIS — Z7901 Long term (current) use of anticoagulants: Secondary | ICD-10-CM | POA: Insufficient documentation

## 2018-11-14 DIAGNOSIS — D72828 Other elevated white blood cell count: Secondary | ICD-10-CM

## 2018-11-14 DIAGNOSIS — R7989 Other specified abnormal findings of blood chemistry: Secondary | ICD-10-CM

## 2018-11-14 DIAGNOSIS — D45 Polycythemia vera: Secondary | ICD-10-CM | POA: Diagnosis not present

## 2018-11-14 LAB — CBC WITH DIFFERENTIAL (CANCER CENTER ONLY)
Abs Immature Granulocytes: 0.6 10*3/uL — ABNORMAL HIGH (ref 0.00–0.07)
Basophils Absolute: 0.4 10*3/uL — ABNORMAL HIGH (ref 0.0–0.1)
Basophils Relative: 2 %
Eosinophils Absolute: 0.4 10*3/uL (ref 0.0–0.5)
Eosinophils Relative: 2 %
HCT: 50.7 % (ref 39.0–52.0)
Hemoglobin: 16.1 g/dL (ref 13.0–17.0)
Immature Granulocytes: 3 %
Lymphocytes Relative: 12 %
Lymphs Abs: 2.5 10*3/uL (ref 0.7–4.0)
MCH: 28 pg (ref 26.0–34.0)
MCHC: 31.8 g/dL (ref 30.0–36.0)
MCV: 88.2 fL (ref 80.0–100.0)
Monocytes Absolute: 2.6 10*3/uL — ABNORMAL HIGH (ref 0.1–1.0)
Monocytes Relative: 12 %
Neutro Abs: 15.1 10*3/uL — ABNORMAL HIGH (ref 1.7–7.7)
Neutrophils Relative %: 69 %
Platelet Count: 292 10*3/uL (ref 150–400)
RBC: 5.75 MIL/uL (ref 4.22–5.81)
RDW: 17.2 % — ABNORMAL HIGH (ref 11.5–15.5)
WBC Count: 21.6 10*3/uL — ABNORMAL HIGH (ref 4.0–10.5)
nRBC: 0 % (ref 0.0–0.2)

## 2018-11-14 LAB — CMP (CANCER CENTER ONLY)
ALT: 20 U/L (ref 0–44)
AST: 25 U/L (ref 15–41)
Albumin: 4.4 g/dL (ref 3.5–5.0)
Alkaline Phosphatase: 93 U/L (ref 38–126)
Anion gap: 7 (ref 5–15)
BILIRUBIN TOTAL: 1 mg/dL (ref 0.3–1.2)
BUN: 16 mg/dL (ref 8–23)
CO2: 27 mmol/L (ref 22–32)
Calcium: 9.1 mg/dL (ref 8.9–10.3)
Chloride: 104 mmol/L (ref 98–111)
Creatinine: 1.15 mg/dL (ref 0.61–1.24)
GFR, Est AFR Am: >60 mL/min (ref >60)
GFR, Estimated: >60 mL/min (ref >60)
GLUCOSE: 89 mg/dL (ref 70–99)
Potassium: 4.3 mmol/L (ref 3.5–5.1)
Sodium: 138 mmol/L (ref 135–145)
Total Protein: 7.3 g/dL (ref 6.5–8.1)

## 2018-11-14 NOTE — Progress Notes (Signed)
OK to give therapeutic phlebotomy per MD [facility] based on pt's labs from today.  Phlebotomy started at 1241 and ended at 1246. 575 grams removed. 16G RAC needle removed intact.  Tolerated well.  VSS.  A&Ox4 and ambulatory w/steady gait.  Denies questions or concerns at this time.  PT stayed for entire post TP observation time.  Given juice and snacks.

## 2018-11-14 NOTE — Progress Notes (Signed)
Hematology and Oncology Follow Up Visit  Gregory Blankenship [id number] 1940-12-27 78 y.o. 11/14/2018 12:09 PM   Principle Diagnosis: 78 year old man with Jak 2+ myeloproliferative disorder diagnosed in February 2013.  He presented with polycythemia vera and erythrocytosis  Secondary diagnosis:   Prostate cancer diagnosed in 2011. He was found to have a Gleason score 3+4 = 7 after a prostatectomy completed on 06/20/2010. The final pathology showed a stage T2c disease.   Current therapy:   Phlebotomy to keep his hematocrit less than 45.  Hydroxyurea 500 mg twice a day started in June 2019.  The dose was reduced to 500 mg daily in August 2019. He is currently taking 500 mg daily except for 1 day takes 1000 mg.  Interim History: Mr. Gregory Blankenship is here for repeat evaluation.  Since the last visit, he has tolerated Droxia at the current dose without any issues.  He denies any side effects associated with this treatment including no fatigue or tiredness.  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  His gout has proceeded the start of hydroxyurea.  He is seeing rheumatologist in the near future.  He denies any recent thrombosis or bleeding episodes.  Denies any recent hospitalization or illnesses.  Patient denied any alteration mental status, neuropathy, confusion or dizziness.  Denies any headaches or lethargy.  Denies any night sweats, weight loss or changes in appetite.  Denied orthopnea, dyspnea on exertion or chest discomfort.  Denies shortness of breath, difficulty breathing hemoptysis or cough.  Denies any abdominal distention, nausea, early satiety or dyspepsia.  Denies any hematuria, frequency, dysuria or nocturia.  Denies any skin irritation, dryness or rash.  Denies any ecchymosis or petechiae.  Denies any lymphadenopathy or clotting.  Denies any heat or cold intolerance.  Denies any anxiety or depression.  Remaining review of system is  negative.   Medications: I have reviewed the patient's current medications. Current Outpatient Medications  Medication Sig Dispense Refill  . acetaminophen (TYLENOL) 325 MG tablet Take 325 mg by mouth every 6 (six) hours as needed for mild pain.     Marland Kitchen allopurinol (ZYLOPRIM) 100 MG tablet   5  . aspirin 81 MG tablet Take 81 mg by mouth every evening.     . carvedilol (COREG) 6.25 MG tablet Take 1 tablet (6.25 mg total) by mouth 2 (two) times daily. 180 tablet 2  . colchicine (COLCRYS) 0.6 MG tablet Take 0.6 mg daily by mouth.    . hydroxyurea (HYDREA) 500 MG capsule Take 1 capsule (500 mg total) by mouth 2 (two) times daily. May take with food to minimize GI side effects. 90 capsule 3  . isosorbide mononitrate (IMDUR) 30 MG 24 hr tablet TAKE 1 TABLET BY MOUTH ONCE DAILY 90 tablet 3  . levothyroxine (SYNTHROID, LEVOTHROID) 100 MCG tablet   5  . NITROSTAT 0.4 MG SL tablet PLACE 1 TABELT UNDER THE TONGUE EVERY 5 MINUTES AS NEEDED FOR CHEST PAIN. 25 tablet 3  . simvastatin (ZOCOR) 20 MG tablet Take 20 mg every evening by mouth.     . warfarin (COUMADIN) 5 MG tablet Take 5 mg See admin instructions by mouth. In the evening on Mon/Wed/Fri     No current facility-administered medications for this visit.     Allergies:  Allergies  Allergen Reactions  . Erythromycin Nausea Only    Past Medical History, Surgical history, Social history, and Family History were reviewed and updated.   Physical Exam:  Blood pressure 109/86, pulse (!) 55, temperature 98 F (36.7 C), temperature source Oral, resp. rate 17, height 6' (1.829 m), weight 185 lb (83.9 kg), SpO2 99 %.    ECOG: 1   General appearance: Comfortable appearing without any discomfort Head: Normocephalic without any trauma Oropharynx: Mucous membranes are moist and pink without any thrush or ulcers. Eyes: Pupils are equal and round reactive to light. Lymph nodes: No cervical, supraclavicular, inguinal or axillary lymphadenopathy.    Heart:regular rate and rhythm.  S1 and S2 without leg edema. Lung: Clear without any rhonchi or wheezes.  No dullness to percussion. Abdomin: Soft, nontender, nondistended with good bowel sounds.  No hepatosplenomegaly. Musculoskeletal: No joint deformity or effusion.  Full range of motion noted. Neurological: No deficits noted on motor, sensory and deep tendon reflex exam. Skin: No petechial rash or dryness.  Appeared moist.      Lab Results: Lab Results  Component Value Date   WBC 21.6 (H) 11/14/2018   HGB 16.1 11/14/2018   HCT 50.7 11/14/2018   MCV 88.2 11/14/2018   PLT 292 11/14/2018     Chemistry      Component Value Date/Time   NA 138 11/14/2018 1124   NA 140 05/09/2017 0844   K 4.3 11/14/2018 1124   K 4.3 05/09/2017 0844   CL 104 11/14/2018 1124   CL 102 02/05/2013 1429   CO2 27 11/14/2018 1124   CO2 25 05/09/2017 0844   BUN 16 11/14/2018 1124   BUN 16.7 05/09/2017 0844   CREATININE 1.15 11/14/2018 1124   CREATININE 1.2 05/09/2017 0844      Component Value Date/Time   CALCIUM 9.1 11/14/2018 1124   CALCIUM 9.4 05/09/2017 0844   ALKPHOS 93 11/14/2018 1124   ALKPHOS 99 05/09/2017 0844   AST 25 11/14/2018 1124   AST 49 (H) 05/09/2017 0844   ALT 20 11/14/2018 1124   ALT 41 05/09/2017 0844   BILITOT 1.0 11/14/2018 1124   BILITOT 0.55 05/09/2017 0844      Impression and Plan:  78 year old man with:  1.  Polycythemia vera diagnosed in 2013.  Presented with elevated hemoglobin and requires phlebotomy periodically.   Risks and benefits of proceeding with phlebotomy today was discussed today.  His hemoglobin is 16 with a hematocrit above 50.  Despite the fact he is asymptomatic we will proceed with phlebotomy today and will reevaluate him in 2 months.   2. Thrombosis prophylaxis: He is on anticoagulation and has not had any recent thrombosis.  3. Leukocytosis and thrombocytosis: Counts are relatively stable with hydroxyurea at the current dose.  He is taking  500 mg daily except for 1 day a week he takes 1000 mg.   4.  Ischemic stroke in November 2018.  No new events noted since the last visit.  5. Follow-up: in 2 months.   15  minutes was spent with the patient face-to-face today.  More than 50% of time was spent on reviewing his disease status, laboratory data and coordinating plan of care.   Zola Button, MD 2/6/202012:09 PM

## 2018-11-14 NOTE — Patient Instructions (Signed)

## 2018-11-14 NOTE — Telephone Encounter (Signed)
Printed avs and calender of upcoming appointment. Per 2/6 ;os

## 2018-11-19 DIAGNOSIS — M2550 Pain in unspecified joint: Secondary | ICD-10-CM | POA: Diagnosis not present

## 2018-11-19 DIAGNOSIS — M1A09X1 Idiopathic chronic gout, multiple sites, with tophus (tophi): Secondary | ICD-10-CM | POA: Diagnosis not present

## 2018-11-19 DIAGNOSIS — E663 Overweight: Secondary | ICD-10-CM | POA: Diagnosis not present

## 2018-11-19 DIAGNOSIS — N183 Chronic kidney disease, stage 3 (moderate): Secondary | ICD-10-CM | POA: Diagnosis not present

## 2018-11-20 ENCOUNTER — Other Ambulatory Visit: Payer: Self-pay | Admitting: Oncology

## 2018-12-02 DIAGNOSIS — E039 Hypothyroidism, unspecified: Secondary | ICD-10-CM | POA: Diagnosis not present

## 2018-12-02 DIAGNOSIS — M109 Gout, unspecified: Secondary | ICD-10-CM | POA: Diagnosis not present

## 2018-12-02 DIAGNOSIS — I10 Essential (primary) hypertension: Secondary | ICD-10-CM | POA: Diagnosis not present

## 2018-12-02 DIAGNOSIS — N183 Chronic kidney disease, stage 3 (moderate): Secondary | ICD-10-CM | POA: Diagnosis not present

## 2018-12-07 DIAGNOSIS — E782 Mixed hyperlipidemia: Secondary | ICD-10-CM | POA: Diagnosis not present

## 2018-12-07 DIAGNOSIS — G459 Transient cerebral ischemic attack, unspecified: Secondary | ICD-10-CM | POA: Diagnosis not present

## 2018-12-07 DIAGNOSIS — Z8546 Personal history of malignant neoplasm of prostate: Secondary | ICD-10-CM | POA: Diagnosis not present

## 2018-12-07 DIAGNOSIS — I10 Essential (primary) hypertension: Secondary | ICD-10-CM | POA: Diagnosis not present

## 2018-12-09 DIAGNOSIS — Z8546 Personal history of malignant neoplasm of prostate: Secondary | ICD-10-CM | POA: Diagnosis not present

## 2018-12-09 DIAGNOSIS — G459 Transient cerebral ischemic attack, unspecified: Secondary | ICD-10-CM | POA: Diagnosis not present

## 2018-12-09 DIAGNOSIS — E782 Mixed hyperlipidemia: Secondary | ICD-10-CM | POA: Diagnosis not present

## 2018-12-09 DIAGNOSIS — I10 Essential (primary) hypertension: Secondary | ICD-10-CM | POA: Diagnosis not present

## 2019-01-14 ENCOUNTER — Other Ambulatory Visit: Payer: Medicare Other

## 2019-01-14 ENCOUNTER — Ambulatory Visit: Payer: Medicare Other | Admitting: Oncology

## 2019-01-18 ENCOUNTER — Other Ambulatory Visit: Payer: Self-pay | Admitting: Cardiovascular Disease

## 2019-02-10 DIAGNOSIS — Z7901 Long term (current) use of anticoagulants: Secondary | ICD-10-CM | POA: Diagnosis not present

## 2019-02-17 DIAGNOSIS — H524 Presbyopia: Secondary | ICD-10-CM | POA: Diagnosis not present

## 2019-02-18 ENCOUNTER — Inpatient Hospital Stay (HOSPITAL_BASED_OUTPATIENT_CLINIC_OR_DEPARTMENT_OTHER): Payer: Medicare Other | Admitting: Oncology

## 2019-02-18 ENCOUNTER — Inpatient Hospital Stay: Payer: Medicare Other | Attending: Oncology

## 2019-02-18 ENCOUNTER — Other Ambulatory Visit: Payer: Self-pay

## 2019-02-18 ENCOUNTER — Inpatient Hospital Stay: Payer: Medicare Other

## 2019-02-18 VITALS — BP 101/72 | HR 53 | Temp 97.6°F | Resp 17 | Ht 72.0 in | Wt 189.9 lb

## 2019-02-18 DIAGNOSIS — Z79899 Other long term (current) drug therapy: Secondary | ICD-10-CM | POA: Diagnosis not present

## 2019-02-18 DIAGNOSIS — D72829 Elevated white blood cell count, unspecified: Secondary | ICD-10-CM

## 2019-02-18 DIAGNOSIS — Z8546 Personal history of malignant neoplasm of prostate: Secondary | ICD-10-CM | POA: Insufficient documentation

## 2019-02-18 DIAGNOSIS — D45 Polycythemia vera: Secondary | ICD-10-CM

## 2019-02-18 DIAGNOSIS — R7989 Other specified abnormal findings of blood chemistry: Secondary | ICD-10-CM | POA: Diagnosis not present

## 2019-02-18 DIAGNOSIS — Z9079 Acquired absence of other genital organ(s): Secondary | ICD-10-CM | POA: Diagnosis not present

## 2019-02-18 LAB — CMP (CANCER CENTER ONLY)
ALT: 19 U/L (ref 0–44)
AST: 21 U/L (ref 15–41)
Albumin: 4.1 g/dL (ref 3.5–5.0)
Alkaline Phosphatase: 81 U/L (ref 38–126)
Anion gap: 8 (ref 5–15)
BUN: 18 mg/dL (ref 8–23)
CO2: 25 mmol/L (ref 22–32)
Calcium: 9 mg/dL (ref 8.9–10.3)
Chloride: 106 mmol/L (ref 98–111)
Creatinine: 1.4 mg/dL — ABNORMAL HIGH (ref 0.61–1.24)
GFR, Est AFR Am: 57 mL/min — ABNORMAL LOW (ref >60)
GFR, Estimated: 49 mL/min — ABNORMAL LOW (ref >60)
Glucose, Bld: 73 mg/dL (ref 70–99)
Potassium: 4.6 mmol/L (ref 3.5–5.1)
Sodium: 139 mmol/L (ref 135–145)
Total Bilirubin: 0.5 mg/dL (ref 0.3–1.2)
Total Protein: 6.8 g/dL (ref 6.5–8.1)

## 2019-02-18 LAB — CBC WITH DIFFERENTIAL (CANCER CENTER ONLY)
Abs Immature Granulocytes: 0.58 10*3/uL — ABNORMAL HIGH (ref 0.00–0.07)
Basophils Absolute: 0.5 10*3/uL — ABNORMAL HIGH (ref 0.0–0.1)
Basophils Relative: 2 %
Eosinophils Absolute: 0.4 10*3/uL (ref 0.0–0.5)
Eosinophils Relative: 2 %
HCT: 51 % (ref 39.0–52.0)
Hemoglobin: 15.6 g/dL (ref 13.0–17.0)
Immature Granulocytes: 3 %
Lymphocytes Relative: 11 %
Lymphs Abs: 2.3 10*3/uL (ref 0.7–4.0)
MCH: 27.7 pg (ref 26.0–34.0)
MCHC: 30.6 g/dL (ref 30.0–36.0)
MCV: 90.6 fL (ref 80.0–100.0)
Monocytes Absolute: 1.8 10*3/uL — ABNORMAL HIGH (ref 0.1–1.0)
Monocytes Relative: 8 %
Neutro Abs: 15.8 10*3/uL — ABNORMAL HIGH (ref 1.7–7.7)
Neutrophils Relative %: 74 %
Platelet Count: 357 10*3/uL (ref 150–400)
RBC: 5.63 MIL/uL (ref 4.22–5.81)
RDW: 15.9 % — ABNORMAL HIGH (ref 11.5–15.5)
WBC Count: 21.3 10*3/uL — ABNORMAL HIGH (ref 4.0–10.5)
nRBC: 0 % (ref 0.0–0.2)

## 2019-02-18 NOTE — Progress Notes (Signed)
Hematology and Oncology Follow Up Visit  Gregory Blankenship [id number] [birth year] 78 y.o. 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  Secondary diagnosis:   Prostate cancer diagnosed in 2011. He was found to have a Gleason score 3+4 = 7 after a prostatectomy completed on 06/20/2010. The final pathology showed a stage T2c disease.   Current therapy:   Phlebotomy to keep his hematocrit less than 45.  Hydroxyurea 500 mg twice a day started in June 2019.  The dose was reduced to 500 mg daily in August 2019. He is currently taking 500 mg daily except for 1 day takes 1000 mg.  Interim History: Mr. Gregory Blankenship is here for a follow-up visit.  Since last visit, he reports no major changes in his health.  He continues to tolerate hydroxyurea without any major complaints.  His performance status and quality of life remains unchanged at this time.  He denies any recent hospitalization or illnesses.  He denies any bleeding complaints.   He denied headaches, blurry vision, syncope or seizures.  Denies any fevers, chills or sweats.  Denied chest pain, palpitation, orthopnea or leg edema.  Denied cough, wheezing or hemoptysis.  Denied nausea, vomiting or abdominal pain.  Denies any constipation or diarrhea.  Denies any frequency urgency or hesitancy.  Denies any arthralgias or myalgias.  Denies any skin rashes or lesions.  Denies any bleeding or clotting tendency.  Denies any easy bruising.  Denies any hair or nail changes.  Denies any anxiety or depression.  Remaining review of system is negative.    Medications: I have reviewed the patient's current medications. Current Outpatient Medications  Medication Sig Dispense Refill  . acetaminophen (TYLENOL) 325 MG tablet Take 325 mg by mouth every 6 (six) hours as needed for mild pain.     Marland Kitchen allopurinol (ZYLOPRIM) 100 MG tablet   5  . aspirin 81 MG tablet Take  81 mg by mouth every evening.     . carvedilol (COREG) 6.25 MG tablet Take 1 tablet (6.25 mg total) by mouth 2 (two) times daily. 180 tablet 2  . colchicine (COLCRYS) 0.6 MG tablet Take 0.6 mg daily by mouth.    . hydroxyurea (HYDREA) 500 MG capsule TAKE 1 CAPSULE(500 MG) BY MOUTH TWICE DAILY. MAY TAKE WITH FOOD TO MINIMIZE GI SIDE EFFECTS 90 capsule 3  . isosorbide mononitrate (IMDUR) 30 MG 24 hr tablet TAKE 1 TABLET BY MOUTH ONCE DAILY 90 tablet 1  . levothyroxine (SYNTHROID, LEVOTHROID) 100 MCG tablet   5  . NITROSTAT 0.4 MG SL tablet PLACE 1 TABELT UNDER THE TONGUE EVERY 5 MINUTES AS NEEDED FOR CHEST PAIN. 25 tablet 3  . simvastatin (ZOCOR) 20 MG tablet Take 20 mg every evening by mouth.     . warfarin (COUMADIN) 5 MG tablet Take 5 mg See admin instructions by mouth. In the evening on Mon/Wed/Fri     No current facility-administered medications for this visit.     Allergies:  Allergies  Allergen Reactions  . Erythromycin Nausea Only    Past Medical History, Surgical history, Social history, and Family History were reviewed and updated.   Physical Exam:   Blood pressure 101/72, pulse (!) 53, temperature 97.6 F (36.4 C), temperature source Oral, resp. rate 17, height 6' (1.829 m), weight 189 lb 14.4 oz (86.1 kg), SpO2 100 %.    ECOG: 1    General appearance:  Alert, awake without any distress. Head: Atraumatic without abnormalities Oropharynx: Without any thrush or ulcers. Eyes: No scleral icterus. Lymph nodes: No lymphadenopathy noted in the cervical, supraclavicular, or axillary nodes Heart:regular rate and rhythm, without any murmurs or gallops.   Lung: Clear to auscultation without any rhonchi, wheezes or dullness to percussion. Abdomin: Soft, nontender without any shifting dullness or ascites. Musculoskeletal: No clubbing or cyanosis. Neurological: No motor or sensory deficits. Skin: No rashes or lesions.     Lab Results: Lab Results  Component Value Date    WBC 21.3 (H) 02/18/2019   HGB 15.6 02/18/2019   HCT 51.0 02/18/2019   MCV 90.6 02/18/2019   PLT 357 02/18/2019     Chemistry      Component Value Date/Time   NA 138 11/14/2018 1124   NA 140 05/09/2017 0844   K 4.3 11/14/2018 1124   K 4.3 05/09/2017 0844   CL 104 11/14/2018 1124   CL 102 02/05/2013 1429   CO2 27 11/14/2018 1124   CO2 25 05/09/2017 0844   BUN 16 11/14/2018 1124   BUN 16.7 05/09/2017 0844   CREATININE 1.15 11/14/2018 1124   CREATININE 1.2 05/09/2017 0844      Component Value Date/Time   CALCIUM 9.1 11/14/2018 1124   CALCIUM 9.4 05/09/2017 0844   ALKPHOS 93 11/14/2018 1124   ALKPHOS 99 05/09/2017 0844   AST 25 11/14/2018 1124   AST 49 (H) 05/09/2017 0844   ALT 20 11/14/2018 1124   ALT 41 05/09/2017 0844   BILITOT 1.0 11/14/2018 1124   BILITOT 0.55 05/09/2017 0844      Impression and Plan:  78 year old man with:  1.  Jak 2+ polycythemia vera diagnosed in 2013.  He has been receiving intermittent phlebotomy and currently on hydroxyurea only.  The natural course of his disease as well as laboratory data were reviewed today in detail.  Although his hematocrit is above 45 his overall counts are adequate and hemoglobin remains relatively stable.  Risks and benefits of proceeding with phlebotomy today was discussed and we have opted to defer this option given his overall disease control on hydroxyurea.  We will reevaluate him in 2 months and consider phlebotomy at the time.   2. Thrombosis prophylaxis: He remains on full dose anticoagulation at this time.  3. Leukocytosis and thrombocytosis: He is currently on hydroxyurea which is controlling his counts adequately.  I see no reason for adjustments of the dose.  4. Follow-up: We will be in 2 months for repeat evaluation.  15  minutes was spent with the patient face-to-face today.  More than 50% of time was dedicated to reviewing his disease status, laboratory data and answering questions regarding future plan  of care.Zola Button, MD 5/12/20209:35 AM

## 2019-02-19 ENCOUNTER — Telehealth: Payer: Self-pay | Admitting: Oncology

## 2019-02-19 NOTE — Telephone Encounter (Signed)
Tried to reach regarding schedule °

## 2019-02-26 DIAGNOSIS — N183 Chronic kidney disease, stage 3 (moderate): Secondary | ICD-10-CM | POA: Diagnosis not present

## 2019-02-26 DIAGNOSIS — M2550 Pain in unspecified joint: Secondary | ICD-10-CM | POA: Diagnosis not present

## 2019-02-26 DIAGNOSIS — M1A09X1 Idiopathic chronic gout, multiple sites, with tophus (tophi): Secondary | ICD-10-CM | POA: Diagnosis not present

## 2019-02-26 DIAGNOSIS — E663 Overweight: Secondary | ICD-10-CM | POA: Diagnosis not present

## 2019-02-27 DIAGNOSIS — Z7901 Long term (current) use of anticoagulants: Secondary | ICD-10-CM | POA: Diagnosis not present

## 2019-03-11 DIAGNOSIS — Z85828 Personal history of other malignant neoplasm of skin: Secondary | ICD-10-CM | POA: Diagnosis not present

## 2019-03-11 DIAGNOSIS — L821 Other seborrheic keratosis: Secondary | ICD-10-CM | POA: Diagnosis not present

## 2019-03-11 DIAGNOSIS — Z85820 Personal history of malignant melanoma of skin: Secondary | ICD-10-CM | POA: Diagnosis not present

## 2019-03-11 DIAGNOSIS — L812 Freckles: Secondary | ICD-10-CM | POA: Diagnosis not present

## 2019-03-20 DIAGNOSIS — N183 Chronic kidney disease, stage 3 (moderate): Secondary | ICD-10-CM | POA: Diagnosis not present

## 2019-03-20 DIAGNOSIS — I10 Essential (primary) hypertension: Secondary | ICD-10-CM | POA: Diagnosis not present

## 2019-03-20 DIAGNOSIS — R21 Rash and other nonspecific skin eruption: Secondary | ICD-10-CM | POA: Diagnosis not present

## 2019-03-20 DIAGNOSIS — E7800 Pure hypercholesterolemia, unspecified: Secondary | ICD-10-CM | POA: Diagnosis not present

## 2019-03-27 DIAGNOSIS — D45 Polycythemia vera: Secondary | ICD-10-CM | POA: Diagnosis not present

## 2019-03-27 DIAGNOSIS — Z7901 Long term (current) use of anticoagulants: Secondary | ICD-10-CM | POA: Diagnosis not present

## 2019-03-27 DIAGNOSIS — N183 Chronic kidney disease, stage 3 (moderate): Secondary | ICD-10-CM | POA: Diagnosis not present

## 2019-03-27 DIAGNOSIS — I2510 Atherosclerotic heart disease of native coronary artery without angina pectoris: Secondary | ICD-10-CM | POA: Diagnosis not present

## 2019-04-09 DIAGNOSIS — N182 Chronic kidney disease, stage 2 (mild): Secondary | ICD-10-CM | POA: Diagnosis not present

## 2019-04-09 DIAGNOSIS — M1A09X1 Idiopathic chronic gout, multiple sites, with tophus (tophi): Secondary | ICD-10-CM | POA: Diagnosis not present

## 2019-04-09 DIAGNOSIS — M2550 Pain in unspecified joint: Secondary | ICD-10-CM | POA: Diagnosis not present

## 2019-04-09 DIAGNOSIS — E663 Overweight: Secondary | ICD-10-CM | POA: Diagnosis not present

## 2019-04-22 ENCOUNTER — Other Ambulatory Visit: Payer: Self-pay

## 2019-04-22 DIAGNOSIS — D45 Polycythemia vera: Secondary | ICD-10-CM

## 2019-04-23 ENCOUNTER — Inpatient Hospital Stay: Payer: Medicare Other

## 2019-04-23 ENCOUNTER — Inpatient Hospital Stay: Payer: Medicare Other | Attending: Oncology

## 2019-04-23 ENCOUNTER — Other Ambulatory Visit: Payer: Self-pay

## 2019-04-23 ENCOUNTER — Inpatient Hospital Stay: Payer: Medicare Other | Admitting: Oncology

## 2019-04-23 ENCOUNTER — Telehealth: Payer: Self-pay | Admitting: Oncology

## 2019-04-23 VITALS — BP 110/60 | HR 55 | Temp 98.0°F | Resp 18 | Ht 72.0 in | Wt 186.5 lb

## 2019-04-23 DIAGNOSIS — Z9079 Acquired absence of other genital organ(s): Secondary | ICD-10-CM | POA: Insufficient documentation

## 2019-04-23 DIAGNOSIS — Z86718 Personal history of other venous thrombosis and embolism: Secondary | ICD-10-CM | POA: Diagnosis not present

## 2019-04-23 DIAGNOSIS — Z7901 Long term (current) use of anticoagulants: Secondary | ICD-10-CM | POA: Insufficient documentation

## 2019-04-23 DIAGNOSIS — Z8546 Personal history of malignant neoplasm of prostate: Secondary | ICD-10-CM

## 2019-04-23 DIAGNOSIS — D45 Polycythemia vera: Secondary | ICD-10-CM

## 2019-04-23 LAB — CMP (CANCER CENTER ONLY)
ALT: 15 U/L (ref 0–44)
AST: 21 U/L (ref 15–41)
Albumin: 4.1 g/dL (ref 3.5–5.0)
Alkaline Phosphatase: 82 U/L (ref 38–126)
Anion gap: 10 (ref 5–15)
BUN: 21 mg/dL (ref 8–23)
CO2: 23 mmol/L (ref 22–32)
Calcium: 8.7 mg/dL — ABNORMAL LOW (ref 8.9–10.3)
Chloride: 106 mmol/L (ref 98–111)
Creatinine: 1.24 mg/dL (ref 0.61–1.24)
GFR, Est AFR Am: >60 mL/min (ref >60)
GFR, Estimated: 57 mL/min — ABNORMAL LOW (ref >60)
Glucose, Bld: 99 mg/dL (ref 70–99)
Potassium: 4.6 mmol/L (ref 3.5–5.1)
Sodium: 139 mmol/L (ref 135–145)
Total Bilirubin: 0.6 mg/dL (ref 0.3–1.2)
Total Protein: 6.8 g/dL (ref 6.5–8.1)

## 2019-04-23 LAB — CBC WITH DIFFERENTIAL (CANCER CENTER ONLY)
Abs Immature Granulocytes: 0.46 10*3/uL — ABNORMAL HIGH (ref 0.00–0.07)
Basophils Absolute: 0.4 10*3/uL — ABNORMAL HIGH (ref 0.0–0.1)
Basophils Relative: 2 %
Eosinophils Absolute: 0.5 10*3/uL (ref 0.0–0.5)
Eosinophils Relative: 2 %
HCT: 48.1 % (ref 39.0–52.0)
Hemoglobin: 15.3 g/dL (ref 13.0–17.0)
Immature Granulocytes: 3 %
Lymphocytes Relative: 11 %
Lymphs Abs: 2.1 10*3/uL (ref 0.7–4.0)
MCH: 28.1 pg (ref 26.0–34.0)
MCHC: 31.8 g/dL (ref 30.0–36.0)
MCV: 88.4 fL (ref 80.0–100.0)
Monocytes Absolute: 1.5 10*3/uL — ABNORMAL HIGH (ref 0.1–1.0)
Monocytes Relative: 8 %
Neutro Abs: 13.8 10*3/uL — ABNORMAL HIGH (ref 1.7–7.7)
Neutrophils Relative %: 74 %
Platelet Count: 415 10*3/uL — ABNORMAL HIGH (ref 150–400)
RBC: 5.44 MIL/uL (ref 4.22–5.81)
RDW: 15.9 % — ABNORMAL HIGH (ref 11.5–15.5)
WBC Count: 18.7 10*3/uL — ABNORMAL HIGH (ref 4.0–10.5)
nRBC: 0 % (ref 0.0–0.2)

## 2019-04-23 NOTE — Telephone Encounter (Signed)
Scheduled appt per 7/15 los. ° °Printed calendar and avs. ° ° °

## 2019-04-23 NOTE — Progress Notes (Signed)
Hematology and Oncology Follow Up Visit  Gregory Blankenship [id number] 1941-07-27 78 y.o. 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   Secondary diagnosis:   Prostate cancer diagnosed in 2011. He was found to have a Gleason score 3+4 = 7 after a prostatectomy completed on 06/20/2010. The final pathology showed a stage T2c disease.   Current therapy:    Phlebotomy to keep his hematocrit less than 45.  Hydroxyurea 500 mg twice a day started in June 2019.  The dose was reduced to 500 mg daily in August 2019.  He is currently taking 500 mg daily except for 1 day takes 1000 mg.  Interim History: Gregory Blankenship returns today for a repeat evaluation.  Since the last visit, he reports no major changes in his health.  He had a tick bite and completed a course of doxycycline without any complications.  He denies any bleeding or thrombosis episodes.  He denies any chest pain or discomfort.  He denies any breathing difficulties.  He had no issues with hydroxyurea or complications at this time.   Patient denied any alteration mental status, neuropathy, confusion or dizziness.  Denies any headaches or lethargy.  Denies any night sweats, weight loss or changes in appetite.  Denied orthopnea, dyspnea on exertion or chest discomfort.  Denies shortness of breath, difficulty breathing hemoptysis or cough.  Denies any abdominal distention, nausea, early satiety or dyspepsia.  Denies any hematuria, frequency, dysuria or nocturia.  Denies any skin irritation, dryness or rash.  Denies any ecchymosis or petechiae.  Denies any lymphadenopathy or clotting.  Denies any heat or cold intolerance.  Denies any anxiety or depression.  Remaining review of system is negative.        Medications: Personally reviewed without any changes today. Current Outpatient Medications  Medication Sig Dispense  Refill  . acetaminophen (TYLENOL) 325 MG tablet Take 325 mg by mouth every 6 (six) hours as needed for mild pain.     Marland Kitchen allopurinol (ZYLOPRIM) 100 MG tablet   5  . aspirin 81 MG tablet Take 81 mg by mouth every evening.     . carvedilol (COREG) 6.25 MG tablet Take 1 tablet (6.25 mg total) by mouth 2 (two) times daily. 180 tablet 2  . colchicine (COLCRYS) 0.6 MG tablet Take 0.6 mg daily by mouth.    . hydroxyurea (HYDREA) 500 MG capsule TAKE 1 CAPSULE(500 MG) BY MOUTH TWICE DAILY. MAY TAKE WITH FOOD TO MINIMIZE GI SIDE EFFECTS 90 capsule 3  . isosorbide mononitrate (IMDUR) 30 MG 24 hr tablet TAKE 1 TABLET BY MOUTH ONCE DAILY 90 tablet 1  . levothyroxine (SYNTHROID, LEVOTHROID) 100 MCG tablet   5  . NITROSTAT 0.4 MG SL tablet PLACE 1 TABELT UNDER THE TONGUE EVERY 5 MINUTES AS NEEDED FOR CHEST PAIN. 25 tablet 3  . simvastatin (ZOCOR) 20 MG tablet Take 20 mg every evening by mouth.     . warfarin (COUMADIN) 5 MG tablet Take 5 mg See admin instructions by mouth. In the evening on Mon/Wed/Fri     No current facility-administered medications for this visit.     Allergies:  Allergies  Allergen Reactions  . Erythromycin Nausea Only    Past Medical History, Surgical history, Social history, and Family History unchanged by my review.   Physical Exam:    Blood pressure 110/60, pulse (!) 55, temperature 98 F (36.7 C),  temperature source Oral, resp. rate 18, height 6' (1.829 m), weight 186 lb 8 oz (84.6 kg), SpO2 99 %.    ECOG: 1   General appearance: Comfortable appearing without any discomfort Head: Normocephalic without any trauma Oropharynx: Mucous membranes are moist and pink without any thrush or ulcers. Eyes: Pupils are equal and round reactive to light. Lymph nodes: No cervical, supraclavicular, inguinal or axillary lymphadenopathy.   Heart:regular rate and rhythm.  S1 and S2 without leg edema. Lung: Clear without any rhonchi or wheezes.  No dullness to percussion. Abdomin:  Soft, nontender, nondistended with good bowel sounds.  No hepatosplenomegaly. Musculoskeletal: No joint deformity or effusion.  Full range of motion noted. Neurological: No deficits noted on motor, sensory and deep tendon reflex exam. Skin: No petechial rash or dryness.  Appeared moist.  .     Lab Results: Lab Results  Component Value Date   WBC 21.3 (H) 02/18/2019   HGB 15.6 02/18/2019   HCT 51.0 02/18/2019   MCV 90.6 02/18/2019   PLT 357 02/18/2019     Chemistry      Component Value Date/Time   NA 139 02/18/2019 0904   NA 140 05/09/2017 0844   K 4.6 02/18/2019 0904   K 4.3 05/09/2017 0844   CL 106 02/18/2019 0904   CL 102 02/05/2013 1429   CO2 25 02/18/2019 0904   CO2 25 05/09/2017 0844   BUN 18 02/18/2019 0904   BUN 16.7 05/09/2017 0844   CREATININE 1.40 (H) 02/18/2019 0904   CREATININE 1.2 05/09/2017 0844      Component Value Date/Time   CALCIUM 9.0 02/18/2019 0904   CALCIUM 9.4 05/09/2017 0844   ALKPHOS 81 02/18/2019 0904   ALKPHOS 99 05/09/2017 0844   AST 21 02/18/2019 0904   AST 49 (H) 05/09/2017 0844   ALT 19 02/18/2019 0904   ALT 41 05/09/2017 0844   BILITOT 0.5 02/18/2019 0904   BILITOT 0.55 05/09/2017 0844      Impression and Plan:  78 year old man with:  1.  Polycythemia vera diagnosed in 2013 after presenting with elevated hemoglobin as well as leukocytosis and thrombocytosis.   He has been receiving intermittent phlebotomy in addition to hydroxyurea.  He has not received phlebotomy as of late his of his overall disease control on hydroxyurea.  Risks and benefits of proceeding with this approach today to keep his hematocrit close to 45.  Complication associated with phlebotomy was also reviewed.  His hemoglobin continues to be adequately controlled and hematocrit close to the target range.   2. Thrombosis prophylaxis: No recent thrombosis or bleeding episodes noted.  He is on full dose anticoagulation for previous thrombosis episodes.  3.  Leukocytosis and thrombocytosis: Controlled at this time with hydroxyurea.  No dose adjustment is needed.  4. Follow-up: In 2 months for repeat evaluation and possible phlebotomy.  25  minutes was spent with the patient face-to-face today.  More than 50% of time was spent on reviewing his laboratory data, treatment options and answering questions regarding future plan of care.   Zola Button, MD 7/15/20208:22 AM

## 2019-05-08 DIAGNOSIS — N183 Chronic kidney disease, stage 3 (moderate): Secondary | ICD-10-CM | POA: Diagnosis not present

## 2019-05-08 DIAGNOSIS — Z1389 Encounter for screening for other disorder: Secondary | ICD-10-CM | POA: Diagnosis not present

## 2019-05-08 DIAGNOSIS — Z7901 Long term (current) use of anticoagulants: Secondary | ICD-10-CM | POA: Diagnosis not present

## 2019-05-08 DIAGNOSIS — Z0000 Encounter for general adult medical examination without abnormal findings: Secondary | ICD-10-CM | POA: Diagnosis not present

## 2019-05-13 IMAGING — US US CAROTID DUPLEX BILAT
1 series · 13 of 24 positions shown · non-contrast
Comparison: None.

CLINICAL DATA: Dizziness.  Tingling of right arm.

EXAM:
BILATERAL CAROTID DUPLEX ULTRASOUND
TECHNIQUE: Gray scale imaging, color Doppler and duplex ultrasound were
performed of bilateral carotid and vertebral arteries in the neck.

[Series 1: us carotid duplex bilat · 0.06mm/px · 13 of 43 slices shown]
[im 1/43]
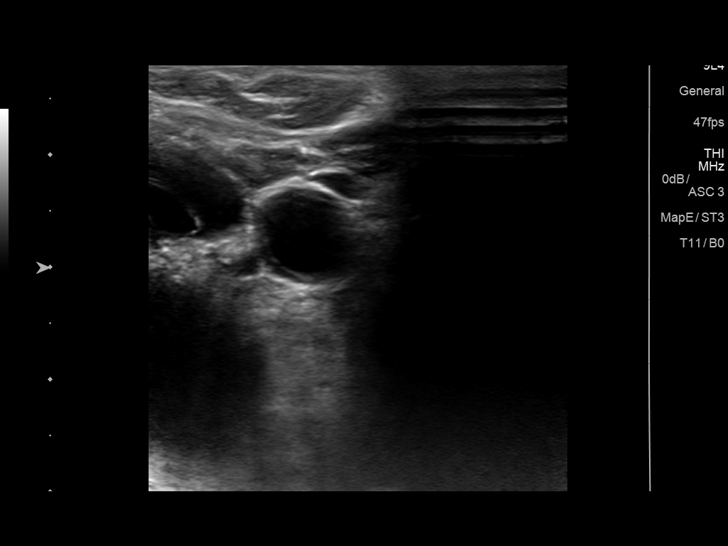
[im 4/43]
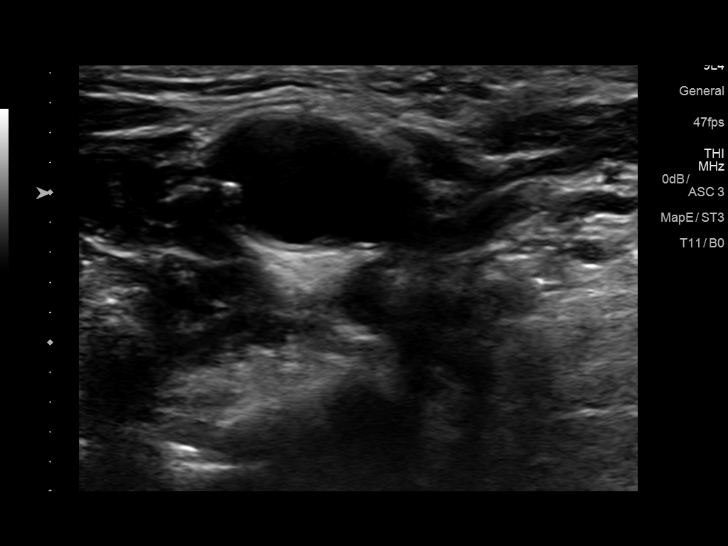
[im 8/43]
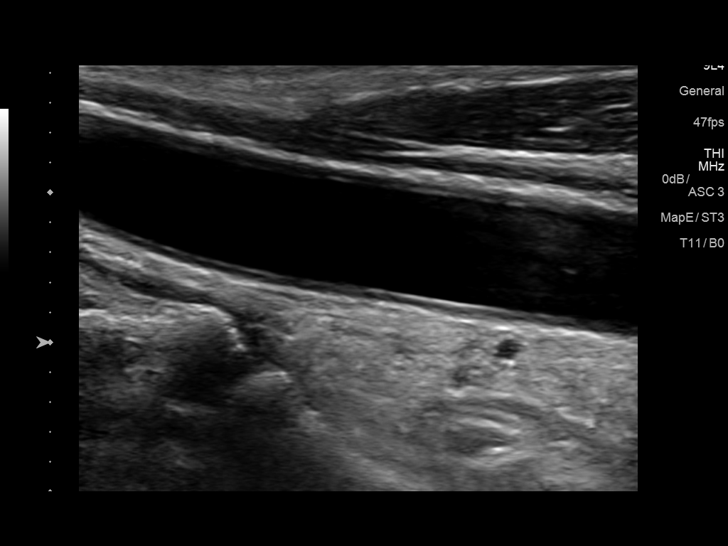
[im 11/43]
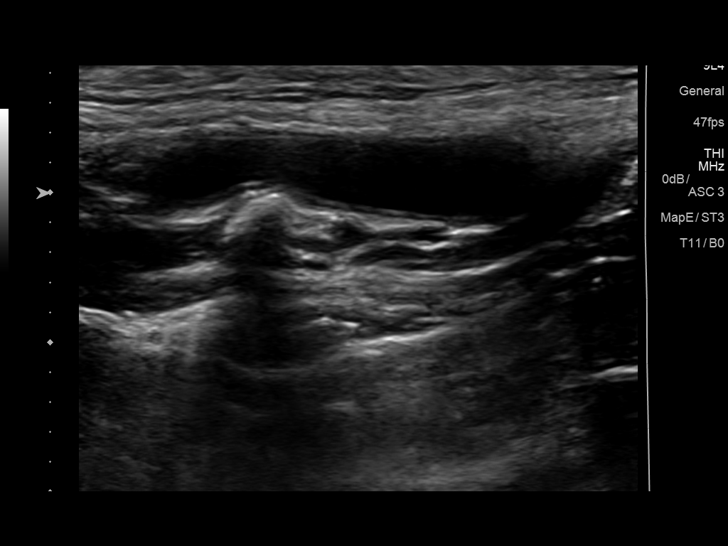
[im 15/43]
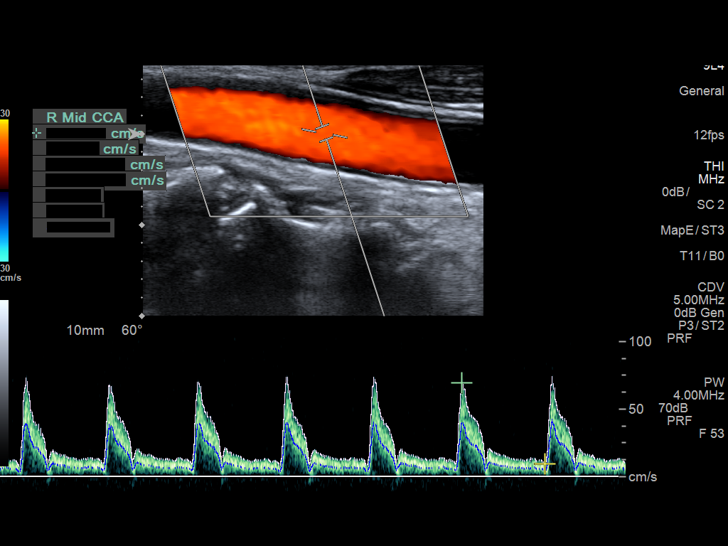
[im 19/43]
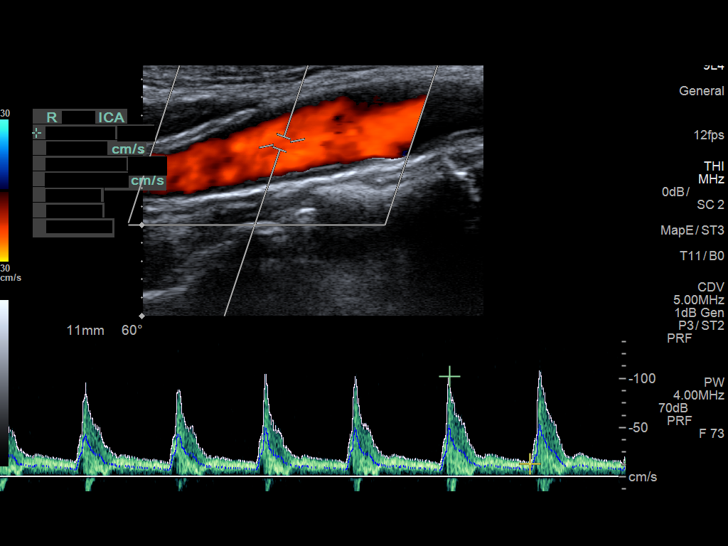
[im 22/43]
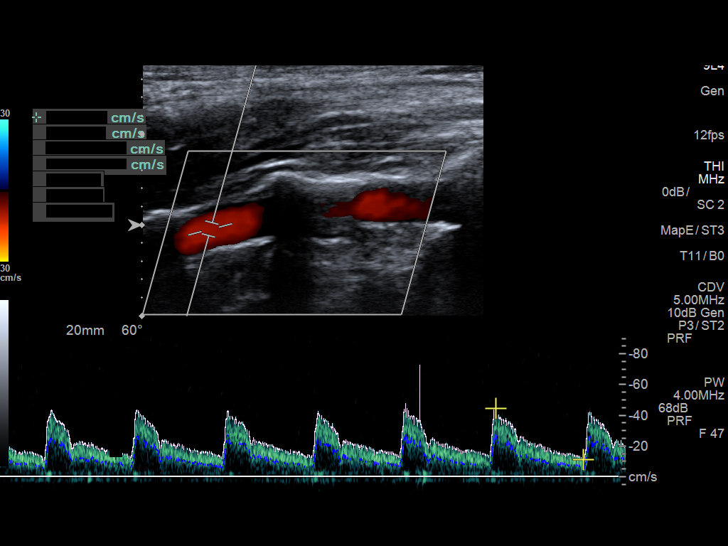
[im 24/43]
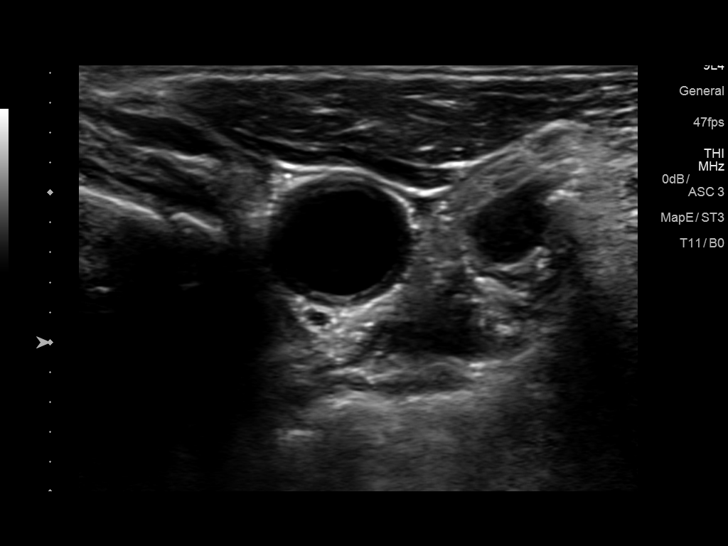
[im 28/43]
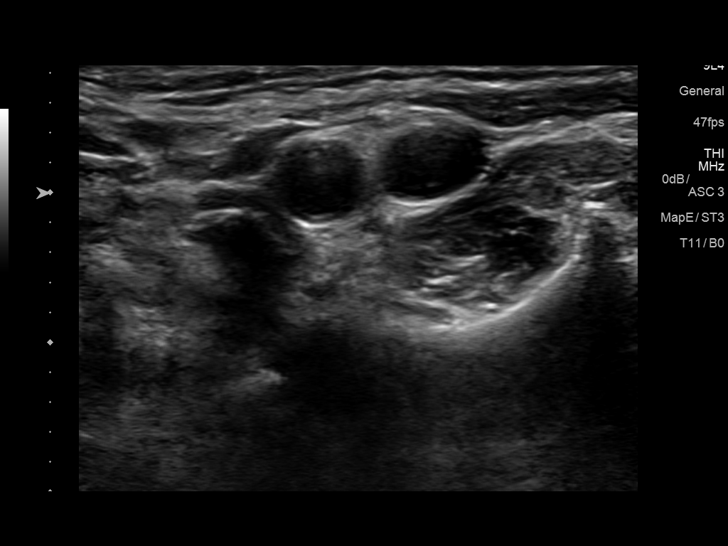
[im 32/43]
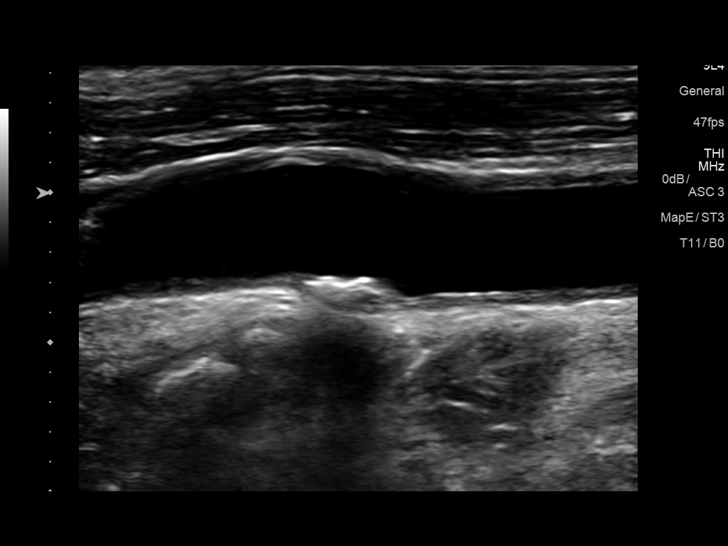
[im 35/43]
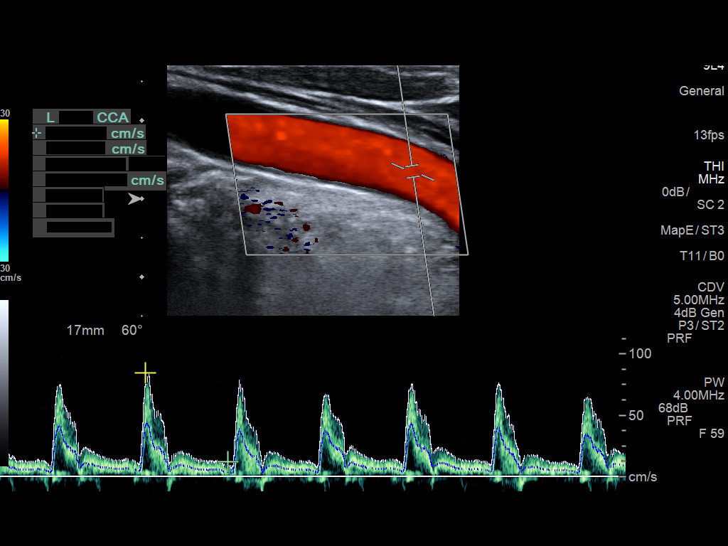
[im 39/43]
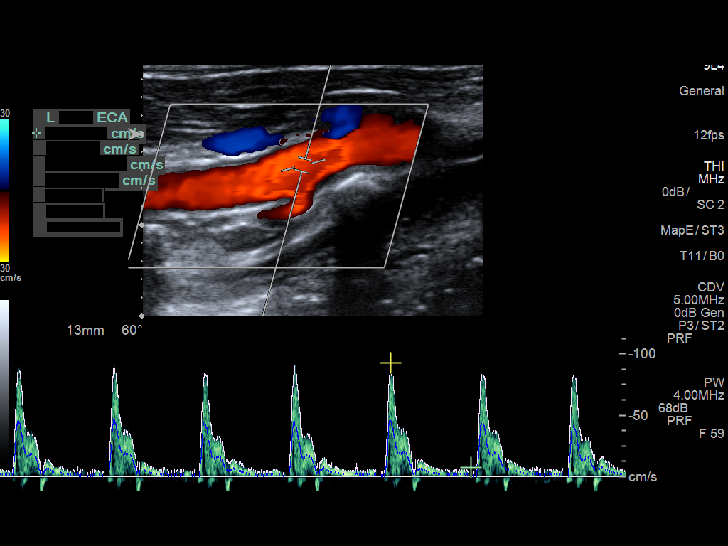
[im 43/43]
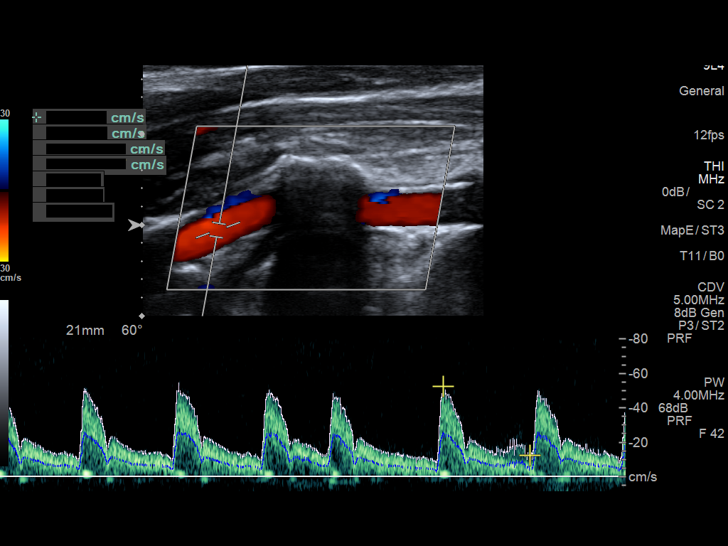

[13 of 24 positions shown; findings below may reference images not displayed]

FINDINGS: Criteria: Quantification of carotid stenosis is based on velocity
parameters that correlate the residual internal carotid diameter
with NASCET-based stenosis levels, using the diameter of the distal
internal carotid lumen as the denominator for stenosis measurement.

The following velocity measurements were obtained:

RIGHT

ICA:  102 cm/sec

CCA:  102 cm/sec

SYSTOLIC ICA/CCA RATIO:

DIASTOLIC ICA/CCA RATIO:

ECA:  82 cm/sec

LEFT

ICA:  85 cm/sec

CCA:  85 cm/sec

SYSTOLIC ICA/CCA RATIO:

DIASTOLIC ICA/CCA RATIO:

ECA:  93 cm/sec

RIGHT CAROTID ARTERY: Little if any plaque in the bulb. Mild
calcified plaque in the lower internal carotid artery.

RIGHT VERTEBRAL ARTERY:  Antegrade.

LEFT CAROTID ARTERY: Minimal calcified plaque in the bulb. Low
resistance internal carotid Doppler pattern is preserved.

LEFT VERTEBRAL ARTERY:  Antegrade.
IMPRESSION: Less than 50% stenosis in the right and left internal carotid
artery.

## 2019-05-19 DIAGNOSIS — Z7901 Long term (current) use of anticoagulants: Secondary | ICD-10-CM | POA: Diagnosis not present

## 2019-06-18 DIAGNOSIS — Z7901 Long term (current) use of anticoagulants: Secondary | ICD-10-CM | POA: Diagnosis not present

## 2019-06-20 ENCOUNTER — Other Ambulatory Visit: Payer: Self-pay | Admitting: Cardiovascular Disease

## 2019-06-25 ENCOUNTER — Inpatient Hospital Stay: Payer: Medicare Other

## 2019-06-25 ENCOUNTER — Inpatient Hospital Stay: Payer: Medicare Other | Attending: Oncology

## 2019-06-25 ENCOUNTER — Inpatient Hospital Stay: Payer: Medicare Other | Admitting: Oncology

## 2019-06-25 ENCOUNTER — Encounter: Payer: Self-pay | Admitting: Oncology

## 2019-06-25 ENCOUNTER — Other Ambulatory Visit: Payer: Self-pay

## 2019-06-25 VITALS — BP 118/60 | HR 58 | Temp 97.8°F | Resp 17 | Ht 72.0 in | Wt 186.7 lb

## 2019-06-25 DIAGNOSIS — Z79899 Other long term (current) drug therapy: Secondary | ICD-10-CM | POA: Insufficient documentation

## 2019-06-25 DIAGNOSIS — D45 Polycythemia vera: Secondary | ICD-10-CM

## 2019-06-25 DIAGNOSIS — Z7982 Long term (current) use of aspirin: Secondary | ICD-10-CM | POA: Insufficient documentation

## 2019-06-25 DIAGNOSIS — D72829 Elevated white blood cell count, unspecified: Secondary | ICD-10-CM | POA: Diagnosis not present

## 2019-06-25 DIAGNOSIS — Z7901 Long term (current) use of anticoagulants: Secondary | ICD-10-CM | POA: Diagnosis not present

## 2019-06-25 LAB — CBC WITH DIFFERENTIAL (CANCER CENTER ONLY)
Abs Immature Granulocytes: 0.19 10*3/uL — ABNORMAL HIGH (ref 0.00–0.07)
Basophils Absolute: 0.3 10*3/uL — ABNORMAL HIGH (ref 0.0–0.1)
Basophils Relative: 2 %
Eosinophils Absolute: 0.4 10*3/uL (ref 0.0–0.5)
Eosinophils Relative: 3 %
HCT: 48.4 % (ref 39.0–52.0)
Hemoglobin: 15.6 g/dL (ref 13.0–17.0)
Immature Granulocytes: 1 %
Lymphocytes Relative: 14 %
Lymphs Abs: 2 10*3/uL (ref 0.7–4.0)
MCH: 28.1 pg (ref 26.0–34.0)
MCHC: 32.2 g/dL (ref 30.0–36.0)
MCV: 87.2 fL (ref 80.0–100.0)
Monocytes Absolute: 1 10*3/uL (ref 0.1–1.0)
Monocytes Relative: 7 %
Neutro Abs: 10.7 10*3/uL — ABNORMAL HIGH (ref 1.7–7.7)
Neutrophils Relative %: 73 %
Platelet Count: 278 10*3/uL (ref 150–400)
RBC: 5.55 MIL/uL (ref 4.22–5.81)
RDW: 15.6 % — ABNORMAL HIGH (ref 11.5–15.5)
WBC Count: 14.5 10*3/uL — ABNORMAL HIGH (ref 4.0–10.5)
nRBC: 0 % (ref 0.0–0.2)

## 2019-06-25 NOTE — Progress Notes (Signed)
Hematology and Oncology Follow Up Visit  Gregory Blankenship [id number] 07/09/1941 78 y.o. 06/25/2019 8:19 AM   Principle Diagnosis: 78 year old man with polycythemia vera diagnosed in February 2013.  He was found to have Jak 2+ myeloproliferative disorder with elevated hemoglobin.  Secondary diagnosis:   Prostate cancer diagnosed in 2011. He was found to have a Gleason score 3+4 = 7 after a prostatectomy completed on 06/20/2010. The final pathology showed a stage T2c disease.   Current therapy:    Phlebotomy to keep his hematocrit less than 45.  Hydroxyurea 500 mg twice a day started in June 2019.  The dose was reduced to 500 mg daily in August 2019.  He is currently taking 500 mg daily except for 1 day takes 1000 mg.  Interim History: Gregory Blankenship is here for a follow-up visit.  Since last visit, he reports no major changes in his health.  He continues to tolerate hydroxyurea without any major complications.  He denies any thrombosis, bleeding or bruising issues.  He denies any constitutional symptoms.  He denies any recent hospitalizations or illnesses.   He denied headaches, blurry vision, syncope or seizures.  Denies any fevers, chills or sweats.  Denied chest pain, palpitation, orthopnea or leg edema.  Denied cough, wheezing or hemoptysis.  Denied nausea, vomiting or abdominal pain.  Denies any constipation or diarrhea.  Denies any frequency urgency or hesitancy.  Denies any arthralgias or myalgias.  Denies any skin rashes or lesions.  Denies any bleeding or clotting tendency.  Denies any easy bruising.  Denies any hair or nail changes.  Denies any anxiety or depression.  Remaining review of system is negative.         Medications: Updated on review. Current Outpatient Medications  Medication Sig Dispense Refill  . acetaminophen (TYLENOL) 325 MG tablet Take 325 mg by mouth every 6 (six) hours as needed for mild pain.     Marland Kitchen allopurinol (ZYLOPRIM) 100 MG tablet   5  . aspirin 81 MG  tablet Take 81 mg by mouth every evening.     . carvedilol (COREG) 6.25 MG tablet TAKE 1 TABLET(6.25 MG) BY MOUTH TWICE DAILY 180 tablet 0  . colchicine (COLCRYS) 0.6 MG tablet Take 0.6 mg daily by mouth.    . hydroxyurea (HYDREA) 500 MG capsule TAKE 1 CAPSULE(500 MG) BY MOUTH TWICE DAILY. MAY TAKE WITH FOOD TO MINIMIZE GI SIDE EFFECTS 90 capsule 3  . isosorbide mononitrate (IMDUR) 30 MG 24 hr tablet TAKE 1 TABLET BY MOUTH ONCE DAILY 90 tablet 1  . levothyroxine (SYNTHROID, LEVOTHROID) 100 MCG tablet   5  . NITROSTAT 0.4 MG SL tablet PLACE 1 TABELT UNDER THE TONGUE EVERY 5 MINUTES AS NEEDED FOR CHEST PAIN. 25 tablet 3  . simvastatin (ZOCOR) 20 MG tablet Take 20 mg every evening by mouth.     . warfarin (COUMADIN) 5 MG tablet Take 5 mg See admin instructions by mouth. In the evening on Mon/Wed/Fri     No current facility-administered medications for this visit.     Allergies:  Allergies  Allergen Reactions  . Erythromycin Nausea Only    Past Medical History, Surgical history, Social history, and Family History without any changes on update.   Physical Exam:     Blood pressure 118/60, pulse (!) 58, temperature 97.8 F (36.6 C), temperature source Oral, resp. rate 17, height 6' (1.829 m), weight 186 lb 11.2 oz (84.7 kg), SpO2 98 %.    ECOG: 1  General appearance: Alert, awake without any distress. Head: Atraumatic without abnormalities Oropharynx: Without any thrush or ulcers. Eyes: No scleral icterus. Lymph nodes: No lymphadenopathy noted in the cervical, supraclavicular, or axillary nodes Heart:regular rate and rhythm, without any murmurs or gallops.   Lung: Clear to auscultation without any rhonchi, wheezes or dullness to percussion. Abdomin: Soft, nontender without any shifting dullness or ascites. Musculoskeletal: No clubbing or cyanosis. Neurological: No motor or sensory deficits. Skin: No rashes or lesions. Psychiatric: Mood and affect appeared  normal.  .     Lab Results: Lab Results  Component Value Date   WBC 18.7 (H) 04/23/2019   HGB 15.3 04/23/2019   HCT 48.1 04/23/2019   MCV 88.4 04/23/2019   PLT 415 (H) 04/23/2019     Chemistry      Component Value Date/Time   NA 139 04/23/2019 0811   NA 140 05/09/2017 0844   K 4.6 04/23/2019 0811   K 4.3 05/09/2017 0844   CL 106 04/23/2019 0811   CL 102 02/05/2013 1429   CO2 23 04/23/2019 0811   CO2 25 05/09/2017 0844   BUN 21 04/23/2019 0811   BUN 16.7 05/09/2017 0844   CREATININE 1.24 04/23/2019 0811   CREATININE 1.2 05/09/2017 0844      Component Value Date/Time   CALCIUM 8.7 (L) 04/23/2019 0811   CALCIUM 9.4 05/09/2017 0844   ALKPHOS 82 04/23/2019 0811   ALKPHOS 99 05/09/2017 0844   AST 21 04/23/2019 0811   AST 49 (H) 05/09/2017 0844   ALT 15 04/23/2019 0811   ALT 41 05/09/2017 0844   BILITOT 0.6 04/23/2019 0811   BILITOT 0.55 05/09/2017 0844      Impression and Plan:  78 year old man with:  1.  Myeloproliferative disorder presented in 2013.  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   The natural course of this disease was updated as well as different treatment options.  He continues to tolerate hydroxyurea without any major complications.  He is laboratory values from today were reviewed and showed stable hemoglobin with hematocrit close to target.  Risks and benefits of proceeding with phlebotomy versus continued observation was discussed and for the time being will be deferred given the fact that he is close of target of hematocrit of 45.  We will reinstitute phlebotomy if his hematocrit reaches 50.   2. Thrombosis prophylaxis: He is on full dose anticoagulation without any recent thrombosis.  3. Leukocytosis and thrombocytosis: His platelet count is back within normal range with white cell count approaching normal counts as well.  No adjusting of his hydroxyurea dosing at this time.  4. Follow-up: He will have repeat evaluation 2 months.  25   minutes was spent with the patient face-to-face today.  More than 50% of time was dedicated to updating his disease status, treatment options and long-term complications related to therapy.   Zola Button, MD 9/16/20208:19 AM

## 2019-06-30 DIAGNOSIS — L570 Actinic keratosis: Secondary | ICD-10-CM | POA: Diagnosis not present

## 2019-06-30 DIAGNOSIS — Z85828 Personal history of other malignant neoplasm of skin: Secondary | ICD-10-CM | POA: Diagnosis not present

## 2019-06-30 DIAGNOSIS — Z85820 Personal history of malignant melanoma of skin: Secondary | ICD-10-CM | POA: Diagnosis not present

## 2019-07-10 DIAGNOSIS — N182 Chronic kidney disease, stage 2 (mild): Secondary | ICD-10-CM | POA: Diagnosis not present

## 2019-07-10 DIAGNOSIS — M2550 Pain in unspecified joint: Secondary | ICD-10-CM | POA: Diagnosis not present

## 2019-07-10 DIAGNOSIS — E663 Overweight: Secondary | ICD-10-CM | POA: Diagnosis not present

## 2019-07-10 DIAGNOSIS — M1A09X1 Idiopathic chronic gout, multiple sites, with tophus (tophi): Secondary | ICD-10-CM | POA: Diagnosis not present

## 2019-07-21 DIAGNOSIS — Z7901 Long term (current) use of anticoagulants: Secondary | ICD-10-CM | POA: Diagnosis not present

## 2019-07-25 DIAGNOSIS — Z8546 Personal history of malignant neoplasm of prostate: Secondary | ICD-10-CM | POA: Diagnosis not present

## 2019-07-25 DIAGNOSIS — G459 Transient cerebral ischemic attack, unspecified: Secondary | ICD-10-CM | POA: Diagnosis not present

## 2019-07-25 DIAGNOSIS — I10 Essential (primary) hypertension: Secondary | ICD-10-CM | POA: Diagnosis not present

## 2019-07-25 DIAGNOSIS — E782 Mixed hyperlipidemia: Secondary | ICD-10-CM | POA: Diagnosis not present

## 2019-08-08 ENCOUNTER — Ambulatory Visit: Payer: Medicare Other | Admitting: Cardiovascular Disease

## 2019-08-12 ENCOUNTER — Other Ambulatory Visit: Payer: Self-pay

## 2019-08-12 ENCOUNTER — Ambulatory Visit: Payer: Medicare Other | Admitting: Cardiovascular Disease

## 2019-08-12 ENCOUNTER — Encounter: Payer: Self-pay | Admitting: Cardiovascular Disease

## 2019-08-12 VITALS — BP 129/78 | HR 54 | Temp 96.1°F | Ht 72.0 in | Wt 191.8 lb

## 2019-08-12 DIAGNOSIS — I2102 ST elevation (STEMI) myocardial infarction involving left anterior descending coronary artery: Secondary | ICD-10-CM

## 2019-08-12 DIAGNOSIS — I255 Ischemic cardiomyopathy: Secondary | ICD-10-CM | POA: Diagnosis not present

## 2019-08-12 DIAGNOSIS — I2699 Other pulmonary embolism without acute cor pulmonale: Secondary | ICD-10-CM

## 2019-08-12 DIAGNOSIS — E782 Mixed hyperlipidemia: Secondary | ICD-10-CM

## 2019-08-12 DIAGNOSIS — I10 Essential (primary) hypertension: Secondary | ICD-10-CM | POA: Diagnosis not present

## 2019-08-12 NOTE — Assessment & Plan Note (Signed)
History of ischemic cardiomyopathy with an EF by 2D echo 09/20/2017 of 40 to 45%.  He is on carvedilol and is not on ACE inhibitor because of renal insufficiency.  He is otherwise asymptomatic.

## 2019-08-12 NOTE — Patient Instructions (Addendum)
Medication Instructions:  Your physician recommends that you continue on your current medications as directed. Please refer to the Current Medication list given to you today.  If you need a refill on your cardiac medications before your next appointment, please call your pharmacy.   Lab work: NONE  Testing/Procedures: NONE  Follow-Up: At [facility], you and your health needs are our priority.  As part of our continuing mission to provide you with exceptional heart care, we have created designated Provider Care Teams.  These Care Teams include your primary Cardiologist (physician) and Advanced Practice Providers (APPs -  Physician Assistants and Nurse Practitioners) who all work together to provide you with the care you need, when you need it. You may see Jonathan Berry, MD or one of the following Advanced Practice Providers on your designated Care Team:    Luke Kilroy, PA-C  Callie Goodrich, PA-C  Jesse Cleaver, FNP  Your physician wants you to follow-up in: 1 year. You will receive a reminder letter in the mail two months in advance. If you don't receive a letter, please call our office to schedule the follow-up appointment.       

## 2019-08-12 NOTE — Assessment & Plan Note (Signed)
History of pulmonary embolism past on Coumadin anticoagulation.

## 2019-08-12 NOTE — Assessment & Plan Note (Signed)
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  Taken to the Cath Lab was unable to cross the mid LAD CTO.  His EF at that time was 30%.  On optimal medical therapy his EF is improved by 2D echo a year ago to 40 to 45% range.  He denies chest pain or shortness of breath.

## 2019-08-12 NOTE — Assessment & Plan Note (Signed)
History of hyperlipidemia on statin therapy with lipid profile performed 03/27/2019 revealing total cholesterol 116, LDL 63 and HDL of 33.

## 2019-08-12 NOTE — Progress Notes (Signed)
08/12/2019 Gregory Blankenship   [birth year]  [id number]  Primary Physician Seward Carol, MD Primary Cardiologist: Lorretta Harp MD FACP, [place], [place], [place]  HPI:  [name] is a 78 y.o.  thin and fit-appearing, married Caucasian male, father of 64, grandfather to 5 grandchildren who I last saw in the office  08/07/2018. He has a history of an anterior ST-segment-elevation myocardial infarction December 02, 2011. He was on Coumadin anticoagulation because of prior pulmonary embolus. I cath'd him radially revealing a left dominant system with an occluded mid LAD. I tried to percutaneously recanalize him. However, this was unsuccessful probably because this was a "CTO." He did have anterior wall motion abnormalities and EF of 30% and anteroapical dyskinesia which ultimately improved over time to an EF of 50% by 2D echo in June of 2014. He is completely asymptomatic. He participated in cardiac rehab. His lipid profile was excellent11/1/16with a total cholesterol of133, LDL 85 and HDL of 31.Since I saw him one year ago he denies chest pain or shortness of breath. He has a daughter who lives in [place] who he visits several times a year.   Since I saw him a year ago he is done well.  He walks daily without limitation or symptoms.  He denies chest pain or shortness of breath.  His daughter still lives in [place] and unfortunately has been unable to visit her although they do communicate via FaceTime.   Current Meds  Medication Sig  . acetaminophen (TYLENOL) 325 MG tablet Take 325 mg by mouth every 6 (six) hours as needed for mild pain.   Marland Kitchen allopurinol (ZYLOPRIM) 100 MG tablet   . aspirin 81 MG tablet Take 81 mg by mouth every evening.   . carvedilol (COREG) 6.25 MG tablet TAKE 1 TABLET(6.25 MG) BY MOUTH TWICE DAILY  . colchicine (COLCRYS) 0.6 MG tablet Take 0.6 mg daily by mouth.  . hydroxyurea (HYDREA) 500 MG capsule TAKE 1 CAPSULE(500 MG) BY MOUTH TWICE DAILY. MAY TAKE  WITH FOOD TO MINIMIZE GI SIDE EFFECTS  . isosorbide mononitrate (IMDUR) 30 MG 24 hr tablet TAKE 1 TABLET BY MOUTH ONCE DAILY  . levothyroxine (SYNTHROID, LEVOTHROID) 100 MCG tablet   . NITROSTAT 0.4 MG SL tablet PLACE 1 TABELT UNDER THE TONGUE EVERY 5 MINUTES AS NEEDED FOR CHEST PAIN.  . simvastatin (ZOCOR) 20 MG tablet Take 20 mg every evening by mouth.   . warfarin (COUMADIN) 5 MG tablet Take 5 mg See admin instructions by mouth. In the evening on Mon/Wed/Fri     Allergies  Allergen Reactions  . Erythromycin Nausea Only    Social History   Socioeconomic History  . Marital status: Married    Spouse name: [name]  . Number of children: 2  . Years of education: college  . Highest education level: Not on file  Occupational History  . Not on file  Social Needs  . Financial resource strain: Not on file  . Food insecurity    Worry: Not on file    Inability: Not on file  . Transportation needs    Medical: Not on file    Non-medical: Not on file  Tobacco Use  . Smoking status: Former Smoker    Quit date: 12/01/1968    Years since quitting: 50.7  . Smokeless tobacco: Never Used  Substance and Sexual Activity  . Alcohol use: No    Comment: hx of 5-6 years ago an occasional beer or wine  .  Drug use: No  . Sexual activity: Not on file  Lifestyle  . Physical activity    Days per week: Not on file    Minutes per session: Not on file  . Stress: Not on file  Relationships  . Social Herbalist on phone: Not on file    Gets together: Not on file    Attends religious service: Not on file    Active member of club or organization: Not on file    Attends meetings of clubs or organizations: Not on file    Relationship status: Not on file  . Intimate partner violence    Fear of current or ex partner: Not on file    Emotionally abused: Not on file    Physically abused: Not on file    Forced sexual activity: Not on file  Other Topics Concern  . Not on file  Social History  Narrative   Lives with wife, [name]   Caffeine use: very little   Right handed      Review of Systems: General: negative for chills, fever, night sweats or weight changes.  Cardiovascular: negative for chest pain, dyspnea on exertion, edema, orthopnea, palpitations, paroxysmal nocturnal dyspnea or shortness of breath Dermatological: negative for rash Respiratory: negative for cough or wheezing Urologic: negative for hematuria Abdominal: negative for nausea, vomiting, diarrhea, bright red blood per rectum, melena, or hematemesis Neurologic: negative for visual changes, syncope, or dizziness All other systems reviewed and are otherwise negative except as noted above.    Blood pressure 129/78, pulse (!) 54, temperature (!) 96.1 F (35.6 C), height 6' (1.829 m), weight 191 lb 12.8 oz (87 kg), SpO2 98 %.  General appearance: alert and no distress Neck: no adenopathy, no carotid bruit, no JVD, supple, symmetrical, trachea midline and thyroid not enlarged, symmetric, no tenderness/mass/nodules Lungs: clear to auscultation bilaterally Heart: regular rate and rhythm, S1, S2 normal, no murmur, click, rub or gallop Extremities: extremities normal, atraumatic, no cyanosis or edema Pulses: 2+ and symmetric Skin: Skin color, texture, turgor normal. No rashes or lesions Neurologic: Alert and oriented X 3, normal strength and tone. Normal symmetric reflexes. Normal coordination and gait  EKG sinus bradycardia 53 with septal Q waves.  Personally reviewed this EKG.  ASSESSMENT AND PLAN:   Pulmonary embolism, 9/11 after prostate surgery History of pulmonary embolism past on Coumadin anticoagulation.  STEMI (ST elevation myocardial infarction) History of CAD status post anterior STEMI 12/02/2011.  Taken to the Cath Lab was unable to cross the mid LAD CTO.  His EF at that time was 30%.  On optimal medical therapy his EF is improved by 2D echo a year ago to 40 to 45% range.  He denies chest pain or  shortness of breath.  Cardiomyopathy, ischemic, EF 35-40% by 2D 2/26 History of ischemic cardiomyopathy with an EF by 2D echo 09/20/2017 of 40 to 45%.  He is on carvedilol and is not on ACE inhibitor because of renal insufficiency.  He is otherwise asymptomatic.  Essential hypertension History of essential hypertension with blood pressure measured at 129/78.  He is on carvedilol.  Hyperlipidemia History of hyperlipidemia on statin therapy with lipid profile performed 03/27/2019 revealing total cholesterol 116, LDL 63 and HDL of 33.      Lorretta Harp MD FACP,FACC,FAHA, [facility] 08/12/2019 9:27 AM

## 2019-08-12 NOTE — Assessment & Plan Note (Signed)
History of essential hypertension with blood pressure measured at 129/78.  He is on carvedilol.

## 2019-08-19 DIAGNOSIS — Z7901 Long term (current) use of anticoagulants: Secondary | ICD-10-CM | POA: Diagnosis not present

## 2019-09-11 DIAGNOSIS — Z85820 Personal history of malignant melanoma of skin: Secondary | ICD-10-CM | POA: Diagnosis not present

## 2019-09-11 DIAGNOSIS — D045 Carcinoma in situ of skin of trunk: Secondary | ICD-10-CM | POA: Diagnosis not present

## 2019-09-11 DIAGNOSIS — Z85828 Personal history of other malignant neoplasm of skin: Secondary | ICD-10-CM | POA: Diagnosis not present

## 2019-09-11 DIAGNOSIS — L821 Other seborrheic keratosis: Secondary | ICD-10-CM | POA: Diagnosis not present

## 2019-09-11 DIAGNOSIS — L812 Freckles: Secondary | ICD-10-CM | POA: Diagnosis not present

## 2019-09-14 ENCOUNTER — Other Ambulatory Visit: Payer: Self-pay | Admitting: Cardiovascular Disease

## 2019-09-15 DIAGNOSIS — Z7901 Long term (current) use of anticoagulants: Secondary | ICD-10-CM | POA: Diagnosis not present

## 2019-10-10 ENCOUNTER — Other Ambulatory Visit: Payer: Self-pay | Admitting: Cardiovascular Disease

## 2019-10-10 ENCOUNTER — Other Ambulatory Visit: Payer: Self-pay | Admitting: Oncology

## 2019-10-13 ENCOUNTER — Other Ambulatory Visit: Payer: Self-pay

## 2019-10-13 DIAGNOSIS — Z7901 Long term (current) use of anticoagulants: Secondary | ICD-10-CM | POA: Diagnosis not present

## 2019-10-13 MED ORDER — ISOSORBIDE MONONITRATE ER 30 MG PO TB24: 30.0000 mg | ORAL_TABLET | Freq: Every day | ORAL | 1 refills | Status: DC

## 2019-11-07 ENCOUNTER — Ambulatory Visit: Payer: Medicare Other

## 2019-11-10 DIAGNOSIS — Z7901 Long term (current) use of anticoagulants: Secondary | ICD-10-CM | POA: Diagnosis not present

## 2019-11-10 DIAGNOSIS — E7800 Pure hypercholesterolemia, unspecified: Secondary | ICD-10-CM | POA: Diagnosis not present

## 2019-11-10 DIAGNOSIS — I10 Essential (primary) hypertension: Secondary | ICD-10-CM | POA: Diagnosis not present

## 2019-11-10 DIAGNOSIS — Z8546 Personal history of malignant neoplasm of prostate: Secondary | ICD-10-CM | POA: Diagnosis not present

## 2019-11-14 ENCOUNTER — Ambulatory Visit: Payer: Medicare Other

## 2019-12-05 DIAGNOSIS — M1A09X1 Idiopathic chronic gout, multiple sites, with tophus (tophi): Secondary | ICD-10-CM | POA: Diagnosis not present

## 2019-12-05 DIAGNOSIS — M2550 Pain in unspecified joint: Secondary | ICD-10-CM | POA: Diagnosis not present

## 2019-12-05 DIAGNOSIS — E663 Overweight: Secondary | ICD-10-CM | POA: Diagnosis not present

## 2019-12-05 DIAGNOSIS — N182 Chronic kidney disease, stage 2 (mild): Secondary | ICD-10-CM | POA: Diagnosis not present

## 2019-12-09 DIAGNOSIS — Z7901 Long term (current) use of anticoagulants: Secondary | ICD-10-CM | POA: Diagnosis not present

## 2020-01-06 DIAGNOSIS — Z7901 Long term (current) use of anticoagulants: Secondary | ICD-10-CM | POA: Diagnosis not present

## 2020-01-22 ENCOUNTER — Other Ambulatory Visit: Payer: Self-pay | Admitting: Cardiovascular Disease

## 2020-01-29 ENCOUNTER — Other Ambulatory Visit: Payer: Self-pay

## 2020-01-29 MED ORDER — ISOSORBIDE MONONITRATE ER 30 MG PO TB24: 30.0000 mg | ORAL_TABLET | Freq: Every day | ORAL | 2 refills | Status: DC

## 2020-02-05 DIAGNOSIS — Z7901 Long term (current) use of anticoagulants: Secondary | ICD-10-CM | POA: Diagnosis not present

## 2020-03-02 DIAGNOSIS — Z7901 Long term (current) use of anticoagulants: Secondary | ICD-10-CM | POA: Diagnosis not present

## 2020-03-09 DIAGNOSIS — Z85820 Personal history of malignant melanoma of skin: Secondary | ICD-10-CM | POA: Diagnosis not present

## 2020-03-09 DIAGNOSIS — Z85828 Personal history of other malignant neoplasm of skin: Secondary | ICD-10-CM | POA: Diagnosis not present

## 2020-03-09 DIAGNOSIS — L570 Actinic keratosis: Secondary | ICD-10-CM | POA: Diagnosis not present

## 2020-03-09 DIAGNOSIS — L821 Other seborrheic keratosis: Secondary | ICD-10-CM | POA: Diagnosis not present

## 2020-03-20 ENCOUNTER — Other Ambulatory Visit: Payer: Self-pay | Admitting: Cardiovascular Disease

## 2020-03-22 ENCOUNTER — Telehealth: Payer: Self-pay | Admitting: Cardiovascular Disease

## 2020-03-22 MED ORDER — CARVEDILOL 6.25 MG PO TABS: ORAL_TABLET | ORAL | 1 refills | Status: DC

## 2020-03-22 NOTE — Telephone Encounter (Signed)
Med refilled.

## 2020-03-22 NOTE — Telephone Encounter (Signed)
   Pt c/o medication issue:  1. Name of Medication:   carvedilol (COREG) 6.25 MG tablet   2. How are you currently taking this medication (dosage and times per day)? TAKE 1 TABLET(6.25 MG) BY MOUTH TWICE DAILY  3. Are you having a reaction (difficulty breathing--STAT)?   4. What is your medication issue? Pt said per pharmacy they need prior auth to refill this meds. He said the pharmacy faxed over paper work. He said he only have 3 more days left and needs refill as soon as possible  Pharmacy: [facility] [place], [place]

## 2020-03-30 DIAGNOSIS — Z7901 Long term (current) use of anticoagulants: Secondary | ICD-10-CM | POA: Diagnosis not present

## 2020-04-09 DIAGNOSIS — Z7901 Long term (current) use of anticoagulants: Secondary | ICD-10-CM | POA: Diagnosis not present

## 2020-05-14 DIAGNOSIS — Z7901 Long term (current) use of anticoagulants: Secondary | ICD-10-CM | POA: Diagnosis not present

## 2020-05-19 DIAGNOSIS — Z0000 Encounter for general adult medical examination without abnormal findings: Secondary | ICD-10-CM | POA: Diagnosis not present

## 2020-05-19 DIAGNOSIS — Z7901 Long term (current) use of anticoagulants: Secondary | ICD-10-CM | POA: Diagnosis not present

## 2020-05-19 DIAGNOSIS — Z86711 Personal history of pulmonary embolism: Secondary | ICD-10-CM | POA: Diagnosis not present

## 2020-05-19 DIAGNOSIS — D6869 Other thrombophilia: Secondary | ICD-10-CM | POA: Diagnosis not present

## 2020-05-19 DIAGNOSIS — Z1389 Encounter for screening for other disorder: Secondary | ICD-10-CM | POA: Diagnosis not present

## 2020-05-19 DIAGNOSIS — Z8546 Personal history of malignant neoplasm of prostate: Secondary | ICD-10-CM | POA: Diagnosis not present

## 2020-05-28 ENCOUNTER — Other Ambulatory Visit: Payer: Self-pay | Admitting: Physician Assistant

## 2020-05-28 ENCOUNTER — Ambulatory Visit (HOSPITAL_COMMUNITY)
Admission: RE | Admit: 2020-05-28 | Discharge: 2020-05-28 | Disposition: A | Discharge status: Home / Self Care | Payer: Medicare Other | Source: Ambulatory Visit | Attending: Pulmonary Disease | Admitting: Pulmonary Disease

## 2020-05-28 DIAGNOSIS — I2699 Other pulmonary embolism without acute cor pulmonale: Secondary | ICD-10-CM

## 2020-05-28 DIAGNOSIS — I255 Ischemic cardiomyopathy: Secondary | ICD-10-CM

## 2020-05-28 DIAGNOSIS — U071 COVID-19: Secondary | ICD-10-CM

## 2020-05-28 DIAGNOSIS — Z23 Encounter for immunization: Secondary | ICD-10-CM | POA: Diagnosis not present

## 2020-05-28 DIAGNOSIS — I10 Essential (primary) hypertension: Secondary | ICD-10-CM

## 2020-05-28 DIAGNOSIS — I2510 Atherosclerotic heart disease of native coronary artery without angina pectoris: Secondary | ICD-10-CM

## 2020-05-28 MED ORDER — EPINEPHRINE 0.3 MG/0.3ML IJ SOAJ: 0.3000 mg | Freq: Once | INTRAMUSCULAR | Status: DC | PRN

## 2020-05-28 MED ORDER — ALBUTEROL SULFATE HFA 108 (90 BASE) MCG/ACT IN AERS: 2.0000 | INHALATION_SPRAY | Freq: Once | RESPIRATORY_TRACT | Status: DC | PRN

## 2020-05-28 MED ORDER — SODIUM CHLORIDE 0.9 % IV SOLN
1200.0000 mg | Freq: Once | INTRAVENOUS | Status: AC
  Administered 2020-05-28: 1200 mg via INTRAVENOUS
  Filled 2020-05-28: qty 10

## 2020-05-28 MED ORDER — METHYLPREDNISOLONE SODIUM SUCC 125 MG IJ SOLR: 125.0000 mg | Freq: Once | INTRAMUSCULAR | Status: DC | PRN

## 2020-05-28 MED ORDER — SODIUM CHLORIDE 0.9 % IV SOLN: INTRAVENOUS | Status: DC | PRN

## 2020-05-28 MED ORDER — DIPHENHYDRAMINE HCL 50 MG/ML IJ SOLN: 50.0000 mg | Freq: Once | INTRAMUSCULAR | Status: DC | PRN

## 2020-05-28 MED ORDER — FAMOTIDINE IN NACL 20-0.9 MG/50ML-% IV SOLN: 20.0000 mg | Freq: Once | INTRAVENOUS | Status: DC | PRN

## 2020-05-28 NOTE — Progress Notes (Signed)
I connected by phone with Gregory Blankenship. on 05/28/2020 at 2:32 PM to discuss the potential use of a new treatment for mild to moderate COVID-19 viral infection in non-hospitalized patients.  This patient is a 79 y.o. male that meets the FDA criteria for Emergency Use Authorization of COVID monoclonal antibody casirivimab/imdevimab.  Has a (+) direct SARS-CoV-2 viral test result  Has mild or moderate COVID-19   Is NOT hospitalized due to COVID-19  Is within 10 days of symptom onset  Has at least one of the high risk factor(s) for progression to severe COVID-19 and/or hospitalization as defined in EUA.  Specific high risk criteria : Cardiovascular disease or hypertension   Hx of pulmonary embolism   I have spoken and communicated the following to the patient or parent/caregiver regarding COVID monoclonal antibody treatment:  1. FDA has authorized the emergency use for the treatment of mild to moderate COVID-19 in adults and pediatric patients with positive results of direct SARS-CoV-2 viral testing who are 61 years of age and older weighing at least 40 kg, and who are at high risk for progressing to severe COVID-19 and/or hospitalization.  2. The significant known and potential risks and benefits of COVID monoclonal antibody, and the extent to which such potential risks and benefits are unknown.  3. Information on available alternative treatments and the risks and benefits of those alternatives, including clinical trials.  4. Patients treated with COVID monoclonal antibody should continue to self-isolate and use infection control measures (e.g., wear mask, isolate, social distance, avoid sharing personal items, clean and disinfect "high touch" surfaces, and frequent handwashing) according to CDC guidelines.   5. The patient or parent/caregiver has the option to accept or refuse COVID monoclonal antibody treatment.  After reviewing this information with the patient, The patient agreed  to proceed with receiving casirivimab\imdevimab infusion and will be provided a copy of the Fact sheet prior to receiving the infusion. [name] 05/28/2020 2:32 PM

## 2020-05-28 NOTE — Progress Notes (Signed)
  Diagnosis: COVID-19  Physician: [place]  Procedure: [facility] Med: casirivimab\imdevimab infusion - Provided patient with casirivimab\imdevimab fact sheet for patients, parents and caregivers prior to infusion.  Complications: No immediate complications noted.  Discharge: Discharged home   [name] 05/28/2020

## 2020-05-28 NOTE — Discharge Instructions (Addendum)

## 2020-06-11 DIAGNOSIS — Z7901 Long term (current) use of anticoagulants: Secondary | ICD-10-CM | POA: Diagnosis not present

## 2020-06-17 DIAGNOSIS — Z7901 Long term (current) use of anticoagulants: Secondary | ICD-10-CM | POA: Diagnosis not present

## 2020-06-29 DIAGNOSIS — Z85820 Personal history of malignant melanoma of skin: Secondary | ICD-10-CM | POA: Diagnosis not present

## 2020-06-29 DIAGNOSIS — Z85828 Personal history of other malignant neoplasm of skin: Secondary | ICD-10-CM | POA: Diagnosis not present

## 2020-06-29 DIAGNOSIS — D485 Neoplasm of uncertain behavior of skin: Secondary | ICD-10-CM | POA: Diagnosis not present

## 2020-06-29 DIAGNOSIS — L570 Actinic keratosis: Secondary | ICD-10-CM | POA: Diagnosis not present

## 2020-06-29 DIAGNOSIS — C44629 Squamous cell carcinoma of skin of left upper limb, including shoulder: Secondary | ICD-10-CM | POA: Diagnosis not present

## 2020-07-20 DIAGNOSIS — Z7901 Long term (current) use of anticoagulants: Secondary | ICD-10-CM | POA: Diagnosis not present

## 2020-08-10 DIAGNOSIS — M2550 Pain in unspecified joint: Secondary | ICD-10-CM | POA: Diagnosis not present

## 2020-08-10 DIAGNOSIS — Z6826 Body mass index (BMI) 26.0-26.9, adult: Secondary | ICD-10-CM | POA: Diagnosis not present

## 2020-08-10 DIAGNOSIS — N182 Chronic kidney disease, stage 2 (mild): Secondary | ICD-10-CM | POA: Diagnosis not present

## 2020-08-10 DIAGNOSIS — M1A09X1 Idiopathic chronic gout, multiple sites, with tophus (tophi): Secondary | ICD-10-CM | POA: Diagnosis not present

## 2020-08-11 ENCOUNTER — Ambulatory Visit (INDEPENDENT_AMBULATORY_CARE_PROVIDER_SITE_OTHER): Payer: Medicare Other | Admitting: Cardiovascular Disease

## 2020-08-11 ENCOUNTER — Other Ambulatory Visit: Payer: Self-pay

## 2020-08-11 ENCOUNTER — Encounter: Payer: Self-pay | Admitting: Cardiovascular Disease

## 2020-08-11 VITALS — BP 114/82 | HR 51 | Ht 72.0 in | Wt 187.6 lb

## 2020-08-11 DIAGNOSIS — I10 Essential (primary) hypertension: Secondary | ICD-10-CM | POA: Diagnosis not present

## 2020-08-11 DIAGNOSIS — I2699 Other pulmonary embolism without acute cor pulmonale: Secondary | ICD-10-CM | POA: Diagnosis not present

## 2020-08-11 DIAGNOSIS — I2102 ST elevation (STEMI) myocardial infarction involving left anterior descending coronary artery: Secondary | ICD-10-CM

## 2020-08-11 DIAGNOSIS — E782 Mixed hyperlipidemia: Secondary | ICD-10-CM

## 2020-08-11 DIAGNOSIS — I2510 Atherosclerotic heart disease of native coronary artery without angina pectoris: Secondary | ICD-10-CM | POA: Diagnosis not present

## 2020-08-11 DIAGNOSIS — I255 Ischemic cardiomyopathy: Secondary | ICD-10-CM

## 2020-08-11 NOTE — Assessment & Plan Note (Signed)
History of remote pulmonary embolism on Coumadin anticoagulation. 

## 2020-08-11 NOTE — Assessment & Plan Note (Signed)
History of ischemic cardiomyopathy with an EF in the 40% range from remote anterior MI currently on carvedilol.  He is completely asymptomatic.

## 2020-08-11 NOTE — Assessment & Plan Note (Signed)
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  He was on Coumadin anticoagulation at that time because of pulmonary embolism.  I cath him from the radial approach revealing a left dominant system with an occluded mid LAD.  I tried to percutaneously recanalized him however this was unsuccessful probably because this was a "CTO".  He did have an anterior wall motion normality with an EF of 30% at that time with anteroapical dyskinesia.  His most recent 2D echo performed 09/20/2017 revealed an EF of 40 to 45% with severe hypokinesia of the anterior apical and septum.  He is completely asymptomatic.

## 2020-08-11 NOTE — Assessment & Plan Note (Signed)
History of hyperlipidemia on statin therapy with lipid profile performed 05/19/2020 revealing an LDL of 59 and HDL 33.

## 2020-08-11 NOTE — Patient Instructions (Signed)

## 2020-08-11 NOTE — Progress Notes (Signed)
08/11/2020 Gregory Blankenship   04-16-1941  [id number]  Primary Physician Seward Carol, MD Primary Cardiologist: Lorretta Harp MD FACP, [place], [place], [place]  HPI:  Gregory Breit. is a 79 y.o.   thin and fit-appearing, married Caucasian male, father of 64, grandfather to 5 grandchildren who I last saw in the office11/12/2018. He has a history of an anterior ST-segment-elevation myocardial infarction December 02, 2011. He was on Coumadin anticoagulation because of prior pulmonary embolus. I cath'd him radially revealing a left dominant system with an occluded mid LAD. I tried to percutaneously recanalize him. However, this was unsuccessful probably because this was a "CTO." He did have anterior wall motion abnormalities and EF of 30% and anteroapical dyskinesia which ultimately improved over time to an EF of 50% by 2D echo in June of 2014. He is completely asymptomatic. He participated in cardiac rehab. His lipid profile was excellent11/1/16with a total cholesterol of133, LDL 85 and HDL of 31.Since I saw him one year ago he denies chest pain or shortness of breath. He has a daughter who lives in [place] who he visits several times a year.   Since I saw him a year ago he is done well.  He walks daily without limitation or symptoms.  He denies chest pain or shortness of breath.  His daughter still lives in [place] and unfortunately has been unable to visit her although they do communicate via FaceTime.  He did come down with COVID-19 in mid August of this year with symptoms of just fatigue.  He had no pulmonary involvement.  Both he and his wife did receive monoclonal antibody infusion and ultimately recovered without residual symptoms.   Current Meds  Medication Sig  . acetaminophen (TYLENOL) 325 MG tablet Take 325 mg by mouth every 6 (six) hours as needed for mild pain.   Marland Kitchen allopurinol (ZYLOPRIM) 300 MG tablet Take 450 mg by mouth daily.  Marland Kitchen aspirin 81 MG tablet Take 81 mg by  mouth every evening.   . carvedilol (COREG) 6.25 MG tablet TAKE 1 TABLET(6.25 MG) BY MOUTH TWICE DAILY  . hydroxyurea (HYDREA) 500 MG capsule TAKE 1 CAPSULE(500 MG) BY MOUTH TWICE DAILY. MAY TAKE WITH FOOD TO MINIMIZE GI SIDE EFFECTS (Patient taking differently: Patient takes 1 tablet Monday-Saturday and on Sunday he takes 2 tablets.)  . isosorbide mononitrate (IMDUR) 30 MG 24 hr tablet Take 1 tablet (30 mg total) by mouth daily.  Marland Kitchen levothyroxine (SYNTHROID, LEVOTHROID) 100 MCG tablet   . NITROSTAT 0.4 MG SL tablet PLACE 1 TABELT UNDER THE TONGUE EVERY 5 MINUTES AS NEEDED FOR CHEST PAIN.  . simvastatin (ZOCOR) 20 MG tablet Take 20 mg every evening by mouth.   . warfarin (COUMADIN) 5 MG tablet Take 5 mg See admin instructions by mouth. In the evening on Mon/Wed/Fri     Allergies  Allergen Reactions  . Erythromycin Nausea Only    Social History   Socioeconomic History  . Marital status: Married    Spouse name: Gregory Blankenship  . Number of children: 2  . Years of education: college  . Highest education level: Not on file  Occupational History  . Not on file  Tobacco Use  . Smoking status: Former Smoker    Quit date: 12/01/1968    Years since quitting: 51.7  . Smokeless tobacco: Never Used  Vaping Use  . Vaping Use: Never used  Substance and Sexual Activity  . Alcohol use: No    Comment:  hx of 5-6 years ago an occasional beer or wine  . Drug use: No  . Sexual activity: Not on file  Other Topics Concern  . Not on file  Social History Narrative   Lives with wife, Gregory Blankenship   Caffeine use: very little   Right handed    Social Determinants of Health   Financial Resource Strain:   . Difficulty of Paying Living Expenses: Not on file  Food Insecurity:   . Worried About Charity fundraiser in the Last Year: Not on file  . Ran Out of Food in the Last Year: Not on file  Transportation Needs:   . Lack of Transportation (Medical): Not on file  . Lack of Transportation (Non-Medical): Not on file   Physical Activity:   . Days of Exercise per Week: Not on file  . Minutes of Exercise per Session: Not on file  Stress:   . Feeling of Stress : Not on file  Social Connections:   . Frequency of Communication with Friends and Family: Not on file  . Frequency of Social Gatherings with Friends and Family: Not on file  . Attends Religious Services: Not on file  . Active Member of Clubs or Organizations: Not on file  . Attends Archivist Meetings: Not on file  . Marital Status: Not on file  Intimate Partner Violence:   . Fear of Current or Ex-Partner: Not on file  . Emotionally Abused: Not on file  . Physically Abused: Not on file  . Sexually Abused: Not on file     Review of Systems: General: negative for chills, fever, night sweats or weight changes.  Cardiovascular: negative for chest pain, dyspnea on exertion, edema, orthopnea, palpitations, paroxysmal nocturnal dyspnea or shortness of breath Dermatological: negative for rash Respiratory: negative for cough or wheezing Urologic: negative for hematuria Abdominal: negative for nausea, vomiting, diarrhea, bright red blood per rectum, melena, or hematemesis Neurologic: negative for visual changes, syncope, or dizziness All other systems reviewed and are otherwise negative except as noted above.    Blood pressure 114/82, pulse (!) 51, height 6' (1.829 m), weight 187 lb 9.6 oz (85.1 kg).  General appearance: alert and no distress Neck: no adenopathy, no carotid bruit, no JVD, supple, symmetrical, trachea midline and thyroid not enlarged, symmetric, no tenderness/mass/nodules Lungs: clear to auscultation bilaterally Heart: regular rate and rhythm, S1, S2 normal, no murmur, click, rub or gallop Extremities: extremities normal, atraumatic, no cyanosis or edema Pulses: 2+ and symmetric Skin: Skin color, texture, turgor normal. No rashes or lesions Neurologic: Alert and oriented X 3, normal strength and tone. Normal symmetric  reflexes. Normal coordination and gait  EKG sinus bradycardia 51 with septal Q waves.  I personally reviewed this EKG.  ASSESSMENT AND PLAN:   Pulmonary embolism, 9/11 after prostate surgery History of remote pulmonary embolism on Coumadin anticoagulation.  STEMI (ST elevation myocardial infarction) History of CAD status post anterior STEMI 12/02/2011.  He was on Coumadin anticoagulation at that time because of pulmonary embolism.  I cath him from the radial approach revealing a left dominant system with an occluded mid LAD.  I tried to percutaneously recanalized him however this was unsuccessful probably because this was a "CTO".  He did have an anterior wall motion normality with an EF of 30% at that time with anteroapical dyskinesia.  His most recent 2D echo performed 09/20/2017 revealed an EF of 40 to 45% with severe hypokinesia of the anterior apical and septum.  He is completely  asymptomatic.  Cardiomyopathy, ischemic, EF 35-40% by 2D 2/26 History of ischemic cardiomyopathy with an EF in the 40% range from remote anterior MI currently on carvedilol.  He is completely asymptomatic.  Essential hypertension History of essential hypertension blood pressure measured today 114/82.  He is on carvedilol.  Hyperlipidemia History of hyperlipidemia on statin therapy with lipid profile performed 05/19/2020 revealing an LDL of 59 and HDL 33.      Lorretta Harp MD [facility], [facility] 08/11/2020 9:56 AM

## 2020-08-11 NOTE — Assessment & Plan Note (Signed)
History of essential hypertension blood pressure measured today 114/82.  He is on carvedilol.

## 2020-08-17 DIAGNOSIS — Z7901 Long term (current) use of anticoagulants: Secondary | ICD-10-CM | POA: Diagnosis not present

## 2020-09-03 ENCOUNTER — Other Ambulatory Visit: Payer: Self-pay | Admitting: Oncology

## 2020-09-08 DIAGNOSIS — Z85828 Personal history of other malignant neoplasm of skin: Secondary | ICD-10-CM | POA: Diagnosis not present

## 2020-09-08 DIAGNOSIS — D485 Neoplasm of uncertain behavior of skin: Secondary | ICD-10-CM | POA: Diagnosis not present

## 2020-09-08 DIAGNOSIS — L080 Pyoderma: Secondary | ICD-10-CM | POA: Diagnosis not present

## 2020-09-08 DIAGNOSIS — L570 Actinic keratosis: Secondary | ICD-10-CM | POA: Diagnosis not present

## 2020-09-08 DIAGNOSIS — Z85820 Personal history of malignant melanoma of skin: Secondary | ICD-10-CM | POA: Diagnosis not present

## 2020-09-15 DIAGNOSIS — Z7901 Long term (current) use of anticoagulants: Secondary | ICD-10-CM | POA: Diagnosis not present

## 2020-09-18 ENCOUNTER — Other Ambulatory Visit: Payer: Self-pay | Admitting: Cardiovascular Disease

## 2020-09-29 DIAGNOSIS — Z7901 Long term (current) use of anticoagulants: Secondary | ICD-10-CM | POA: Diagnosis not present

## 2020-10-28 DIAGNOSIS — Z7901 Long term (current) use of anticoagulants: Secondary | ICD-10-CM | POA: Diagnosis not present

## 2020-11-03 DIAGNOSIS — H6123 Impacted cerumen, bilateral: Secondary | ICD-10-CM | POA: Diagnosis not present

## 2020-11-08 DIAGNOSIS — Z85820 Personal history of malignant melanoma of skin: Secondary | ICD-10-CM | POA: Diagnosis not present

## 2020-11-08 DIAGNOSIS — Z85828 Personal history of other malignant neoplasm of skin: Secondary | ICD-10-CM | POA: Diagnosis not present

## 2020-11-08 DIAGNOSIS — L570 Actinic keratosis: Secondary | ICD-10-CM | POA: Diagnosis not present

## 2020-11-08 DIAGNOSIS — L738 Other specified follicular disorders: Secondary | ICD-10-CM | POA: Diagnosis not present

## 2020-11-17 DIAGNOSIS — M109 Gout, unspecified: Secondary | ICD-10-CM | POA: Diagnosis not present

## 2020-11-17 DIAGNOSIS — Z7901 Long term (current) use of anticoagulants: Secondary | ICD-10-CM | POA: Diagnosis not present

## 2020-11-17 DIAGNOSIS — N1831 Chronic kidney disease, stage 3a: Secondary | ICD-10-CM | POA: Diagnosis not present

## 2020-11-17 DIAGNOSIS — D6869 Other thrombophilia: Secondary | ICD-10-CM | POA: Diagnosis not present

## 2020-12-01 DIAGNOSIS — Z7901 Long term (current) use of anticoagulants: Secondary | ICD-10-CM | POA: Diagnosis not present

## 2020-12-14 ENCOUNTER — Inpatient Hospital Stay: Payer: Medicare Other

## 2020-12-14 ENCOUNTER — Inpatient Hospital Stay: Payer: Medicare Other | Attending: Oncology | Admitting: Oncology

## 2020-12-14 ENCOUNTER — Telehealth: Payer: Self-pay

## 2020-12-14 ENCOUNTER — Other Ambulatory Visit: Payer: Self-pay

## 2020-12-14 VITALS — BP 148/63 | HR 58 | Temp 96.7°F | Resp 16 | Ht 72.0 in | Wt 189.8 lb

## 2020-12-14 DIAGNOSIS — D75839 Thrombocytosis, unspecified: Secondary | ICD-10-CM | POA: Diagnosis not present

## 2020-12-14 DIAGNOSIS — D45 Polycythemia vera: Secondary | ICD-10-CM | POA: Diagnosis not present

## 2020-12-14 DIAGNOSIS — Z8546 Personal history of malignant neoplasm of prostate: Secondary | ICD-10-CM | POA: Diagnosis not present

## 2020-12-14 DIAGNOSIS — D72829 Elevated white blood cell count, unspecified: Secondary | ICD-10-CM | POA: Insufficient documentation

## 2020-12-14 DIAGNOSIS — Z7901 Long term (current) use of anticoagulants: Secondary | ICD-10-CM | POA: Insufficient documentation

## 2020-12-14 DIAGNOSIS — Z9079 Acquired absence of other genital organ(s): Secondary | ICD-10-CM | POA: Diagnosis not present

## 2020-12-14 DIAGNOSIS — Z79899 Other long term (current) drug therapy: Secondary | ICD-10-CM | POA: Insufficient documentation

## 2020-12-14 DIAGNOSIS — Z7982 Long term (current) use of aspirin: Secondary | ICD-10-CM | POA: Insufficient documentation

## 2020-12-14 LAB — CBC WITH DIFFERENTIAL (CANCER CENTER ONLY)
Abs Immature Granulocytes: 0.67 10*3/uL — ABNORMAL HIGH (ref 0.00–0.07)
Basophils Absolute: 0.4 10*3/uL — ABNORMAL HIGH (ref 0.0–0.1)
Basophils Relative: 2 %
Eosinophils Absolute: 0.5 10*3/uL (ref 0.0–0.5)
Eosinophils Relative: 2 %
HCT: 50.3 % (ref 39.0–52.0)
Hemoglobin: 16.3 g/dL (ref 13.0–17.0)
Immature Granulocytes: 3 %
Lymphocytes Relative: 11 %
Lymphs Abs: 2.4 10*3/uL (ref 0.7–4.0)
MCH: 29.3 pg (ref 26.0–34.0)
MCHC: 32.4 g/dL (ref 30.0–36.0)
MCV: 90.5 fL (ref 80.0–100.0)
Monocytes Absolute: 2 10*3/uL — ABNORMAL HIGH (ref 0.1–1.0)
Monocytes Relative: 9 %
Neutro Abs: 16.8 10*3/uL — ABNORMAL HIGH (ref 1.7–7.7)
Neutrophils Relative %: 73 %
Platelet Count: 425 10*3/uL — ABNORMAL HIGH (ref 150–400)
RBC: 5.56 MIL/uL (ref 4.22–5.81)
RDW: 15.8 % — ABNORMAL HIGH (ref 11.5–15.5)
WBC Count: 22.7 10*3/uL — ABNORMAL HIGH (ref 4.0–10.5)
nRBC: 0 % (ref 0.0–0.2)

## 2020-12-14 LAB — CMP (CANCER CENTER ONLY)
ALT: 15 U/L (ref 0–44)
AST: 23 U/L (ref 15–41)
Albumin: 4.3 g/dL (ref 3.5–5.0)
Alkaline Phosphatase: 76 U/L (ref 38–126)
Anion gap: 6 (ref 5–15)
BUN: 19 mg/dL (ref 8–23)
CO2: 26 mmol/L (ref 22–32)
Calcium: 8.9 mg/dL (ref 8.9–10.3)
Chloride: 103 mmol/L (ref 98–111)
Creatinine: 1.28 mg/dL — ABNORMAL HIGH (ref 0.61–1.24)
GFR, Estimated: 58 mL/min — ABNORMAL LOW (ref >60)
Glucose, Bld: 92 mg/dL (ref 70–99)
Potassium: 4.3 mmol/L (ref 3.5–5.1)
Sodium: 135 mmol/L (ref 135–145)
Total Bilirubin: 0.8 mg/dL (ref 0.3–1.2)
Total Protein: 6.9 g/dL (ref 6.5–8.1)

## 2020-12-14 NOTE — Telephone Encounter (Signed)
-----   Message from Wyatt Portela, MD sent at 12/14/2020  1:34 PM EST ----- Please let him know his CBC is stable. No need to adjust his hydroxyurea dose.  He is to continue with he is taking now.

## 2020-12-14 NOTE — Telephone Encounter (Signed)
TCT patient regarding his stable CBC and to keep taking his hydroxyurea dose as prescribed. He was agreeable and verbalized understanding.

## 2020-12-14 NOTE — Progress Notes (Signed)
Hematology and Oncology Follow Up Visit  Gregory Blankenship [id number] 10-29-1940 80 y.o. 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   Secondary diagnosis:   Prostate cancer diagnosed in 2011. He was found to have a Gleason score 3+4 = 7 after a prostatectomy completed on 06/20/2010. The final pathology showed a stage T2c disease.   Current therapy:    Phlebotomy to keep his hematocrit less than 45.  Hydroxyurea 500 mg twice a day started in June 2019.  The dose was reduced to 500 mg daily in August 2019.  He is currently on 500 mg daily except for 1 day he takes 1000 mg.  Interim History: Gregory Blankenship returns today for repeat evaluation.  Since the last visit, he reports no major changes in his health.  He continues to tolerate hydroxyurea without any complaints.  He denies any nausea vomiting or abdominal pain.  He denies any epistaxis hematochezia.  Denies any recent hospitalization or illnesses.        Medications: Unchanged on review. Current Outpatient Medications  Medication Sig Dispense Refill  . acetaminophen (TYLENOL) 325 MG tablet Take 325 mg by mouth every 6 (six) hours as needed for mild pain.     Marland Kitchen allopurinol (ZYLOPRIM) 300 MG tablet Take 450 mg by mouth daily.    Marland Kitchen aspirin 81 MG tablet Take 81 mg by mouth every evening.     . carvedilol (COREG) 6.25 MG tablet TAKE 1 TABLET(6.25 MG) BY MOUTH TWICE DAILY 180 tablet 1  . hydroxyurea (HYDREA) 500 MG capsule TAKE 1 CAPSULE(500 MG) BY MOUTH TWICE DAILY. MAY TAKE WITH FOOD TO MINIMIZE GI SIDE EFFECTS 90 capsule 3  . isosorbide mononitrate (IMDUR) 30 MG 24 hr tablet Take 1 tablet (30 mg total) by mouth daily. 90 tablet 2  . levothyroxine (SYNTHROID, LEVOTHROID) 100 MCG tablet   5  . NITROSTAT 0.4 MG SL tablet PLACE 1 TABELT UNDER THE TONGUE EVERY 5 MINUTES AS NEEDED FOR CHEST PAIN. 25 tablet 3  . simvastatin (ZOCOR) 20 MG tablet Take 20 mg every  evening by mouth.     . warfarin (COUMADIN) 5 MG tablet Take 5 mg See admin instructions by mouth. In the evening on Mon/Wed/Fri     No current facility-administered medications for this visit.    Allergies:  Allergies  Allergen Reactions  . Erythromycin Nausea Only       Physical Exam:     Blood pressure (!) 148/63, pulse (!) 58, temperature (!) 96.7 F (35.9 C), temperature source Tympanic, resp. rate 16, height 6' (1.829 m), weight 189 lb 12.8 oz (86.1 kg), SpO2 99 %.     ECOG: 1   General appearance: Comfortable appearing without any discomfort Head: Normocephalic without any trauma Oropharynx: Mucous membranes are moist and pink without any thrush or ulcers. Eyes: Pupils are equal and round reactive to light. Lymph nodes: No cervical, supraclavicular, inguinal or axillary lymphadenopathy.   Heart:regular rate and rhythm.  S1 and S2 without leg edema. Lung: Clear without any rhonchi or wheezes.  No dullness to percussion. Abdomin: Soft, nontender, nondistended with good bowel sounds.  No hepatosplenomegaly. Musculoskeletal: No joint deformity or effusion.  Full range of motion noted. Neurological: No deficits noted on motor, sensory and deep tendon reflex exam. Skin: No petechial rash or dryness.  Appeared moist.    .     Lab Results: Lab Results  Component Value Date   WBC  14.5 (H) 06/25/2019   HGB 15.6 06/25/2019   HCT 48.4 06/25/2019   MCV 87.2 06/25/2019   PLT 278 06/25/2019     Chemistry      Component Value Date/Time   NA 139 04/23/2019 0811   NA 140 05/09/2017 0844   K 4.6 04/23/2019 0811   K 4.3 05/09/2017 0844   CL 106 04/23/2019 0811   CL 102 02/05/2013 1429   CO2 23 04/23/2019 0811   CO2 25 05/09/2017 0844   BUN 21 04/23/2019 0811   BUN 16.7 05/09/2017 0844   CREATININE 1.24 04/23/2019 0811   CREATININE 1.2 05/09/2017 0844      Component Value Date/Time   CALCIUM 8.7 (L) 04/23/2019 0811   CALCIUM 9.4 05/09/2017 0844   ALKPHOS  82 04/23/2019 0811   ALKPHOS 99 05/09/2017 0844   AST 21 04/23/2019 0811   AST 49 (H) 05/09/2017 0844   ALT 15 04/23/2019 0811   ALT 41 05/09/2017 0844   BILITOT 0.6 04/23/2019 0811   BILITOT 0.55 05/09/2017 0844         Impression and Plan:  80 year old man with:  1.  JAK2 positive myeloproliferative disorder diagnosed in 2013.    He has been receiving intermittent phlebotomy and was started on hydroxyurea in 2019.  Risks and benefits of resuming this treatment were discussed at this time.  Complication associated with hydroxyurea reviewed including cytopenias, marrow failure, dermatological toxicities among others.  Laboratory data from February 2022 showed elevated white cell count compared to September 2020 where his white cell count was under reasonable control.  After discussion today, he is agreeable to continue hydroxyurea and we will update his laboratory testing and adjust his dose accordingly.   2. Thrombosis prophylaxis: He is currently on warfarin without any recent thrombosis.  3. Leukocytosis and thrombocytosis: Related to his myeloproliferative disorder and will be managed with hydroxyurea.  4. Follow-up: In 2 months for repeat follow-up.  30  minutes were dedicated to this visit.  The time was spent on reviewing laboratory data, disease status update and treatment options.   Zola Button, MD 3/8/202212:40 PM

## 2020-12-29 DIAGNOSIS — Z7901 Long term (current) use of anticoagulants: Secondary | ICD-10-CM | POA: Diagnosis not present

## 2021-01-06 DIAGNOSIS — Z85828 Personal history of other malignant neoplasm of skin: Secondary | ICD-10-CM | POA: Diagnosis not present

## 2021-01-06 DIAGNOSIS — B352 Tinea manuum: Secondary | ICD-10-CM | POA: Diagnosis not present

## 2021-01-06 DIAGNOSIS — Z85820 Personal history of malignant melanoma of skin: Secondary | ICD-10-CM | POA: Diagnosis not present

## 2021-01-06 DIAGNOSIS — L570 Actinic keratosis: Secondary | ICD-10-CM | POA: Diagnosis not present

## 2021-01-09 ENCOUNTER — Other Ambulatory Visit: Payer: Self-pay | Admitting: Cardiovascular Disease

## 2021-01-26 DIAGNOSIS — Z7901 Long term (current) use of anticoagulants: Secondary | ICD-10-CM | POA: Diagnosis not present

## 2021-02-15 ENCOUNTER — Other Ambulatory Visit: Payer: Medicare Other

## 2021-02-15 ENCOUNTER — Ambulatory Visit: Payer: Medicare Other | Admitting: Oncology

## 2021-02-17 ENCOUNTER — Inpatient Hospital Stay: Payer: Medicare Other | Attending: Oncology | Admitting: Oncology

## 2021-02-17 ENCOUNTER — Other Ambulatory Visit: Payer: Self-pay

## 2021-02-17 ENCOUNTER — Inpatient Hospital Stay: Payer: Medicare Other

## 2021-02-17 VITALS — BP 90/72 | HR 105 | Temp 97.6°F | Resp 16 | Ht 72.0 in | Wt 187.8 lb

## 2021-02-17 DIAGNOSIS — Z7901 Long term (current) use of anticoagulants: Secondary | ICD-10-CM | POA: Diagnosis not present

## 2021-02-17 DIAGNOSIS — D45 Polycythemia vera: Secondary | ICD-10-CM | POA: Diagnosis not present

## 2021-02-17 DIAGNOSIS — Z79899 Other long term (current) drug therapy: Secondary | ICD-10-CM | POA: Diagnosis not present

## 2021-02-17 DIAGNOSIS — Z7982 Long term (current) use of aspirin: Secondary | ICD-10-CM | POA: Diagnosis not present

## 2021-02-17 DIAGNOSIS — Z8546 Personal history of malignant neoplasm of prostate: Secondary | ICD-10-CM | POA: Insufficient documentation

## 2021-02-17 LAB — CBC WITH DIFFERENTIAL (CANCER CENTER ONLY)
Abs Immature Granulocytes: 0.37 10*3/uL — ABNORMAL HIGH (ref 0.00–0.07)
Basophils Absolute: 0.3 10*3/uL — ABNORMAL HIGH (ref 0.0–0.1)
Basophils Relative: 2 %
Eosinophils Absolute: 0.4 10*3/uL (ref 0.0–0.5)
Eosinophils Relative: 2 %
HCT: 53.5 % — ABNORMAL HIGH (ref 39.0–52.0)
Hemoglobin: 17.1 g/dL — ABNORMAL HIGH (ref 13.0–17.0)
Immature Granulocytes: 2 %
Lymphocytes Relative: 15 %
Lymphs Abs: 2.4 10*3/uL (ref 0.7–4.0)
MCH: 29.4 pg (ref 26.0–34.0)
MCHC: 32 g/dL (ref 30.0–36.0)
MCV: 92.1 fL (ref 80.0–100.0)
Monocytes Absolute: 1.4 10*3/uL — ABNORMAL HIGH (ref 0.1–1.0)
Monocytes Relative: 9 %
Neutro Abs: 11 10*3/uL — ABNORMAL HIGH (ref 1.7–7.7)
Neutrophils Relative %: 70 %
Platelet Count: 358 10*3/uL (ref 150–400)
RBC: 5.81 MIL/uL (ref 4.22–5.81)
RDW: 15.8 % — ABNORMAL HIGH (ref 11.5–15.5)
WBC Count: 15.8 10*3/uL — ABNORMAL HIGH (ref 4.0–10.5)
nRBC: 0 % (ref 0.0–0.2)

## 2021-02-17 LAB — CMP (CANCER CENTER ONLY)
ALT: 10 U/L (ref 0–44)
AST: 20 U/L (ref 15–41)
Albumin: 4.2 g/dL (ref 3.5–5.0)
Alkaline Phosphatase: 70 U/L (ref 38–126)
Anion gap: 9 (ref 5–15)
BUN: 15 mg/dL (ref 8–23)
CO2: 28 mmol/L (ref 22–32)
Calcium: 9.2 mg/dL (ref 8.9–10.3)
Chloride: 102 mmol/L (ref 98–111)
Creatinine: 1.21 mg/dL (ref 0.61–1.24)
GFR, Estimated: >60 mL/min (ref >60)
Glucose, Bld: 68 mg/dL — ABNORMAL LOW (ref 70–99)
Potassium: 4.8 mmol/L (ref 3.5–5.1)
Sodium: 139 mmol/L (ref 135–145)
Total Bilirubin: 0.6 mg/dL (ref 0.3–1.2)
Total Protein: 6.6 g/dL (ref 6.5–8.1)

## 2021-02-17 MED ORDER — HYDROXYUREA 500 MG PO CAPS: ORAL_CAPSULE | ORAL | 3 refills | Status: DC

## 2021-02-17 NOTE — Progress Notes (Signed)
Hematology and Oncology Follow Up Visit  Gregory Blankenship [id number] 03/03/1941 80 y.o. 02/17/2021 8:39 AM   Principle Diagnosis: 80 year old man with polycythemia vera diagnosed in February 2013.  He was found to have JAK2 positive mutation.   Secondary diagnosis:   Prostate cancer diagnosed in 2011. He was found to have a Gleason score 3+4 = 7 after a prostatectomy completed on 06/20/2010. The final pathology showed a stage T2c disease.   Prior therapies  Phlebotomy to keep his hematocrit less than 45.  Hydroxyurea 500 mg twice a day started in June 2019.  The dose was reduced to 500 mg daily in August 2019.  Current therapy:    Hydroxyurea 500 mg daily except for 1 day he takes 1000 mg.  Interim History: Gregory Blankenship is here for a follow-up visit.  Since the last visit, he reports no major changes in his health.  He denies any recent hospitalization or illnesses.  He denies any thrombosis or bleeding complications.  He continues to attempt activities of daily living or any quadrant.  He has tolerated the current dose of hydroxyurea without any decline.  He denies any neurological deficits or bruising.        Medications: Updated on review. Current Outpatient Medications  Medication Sig Dispense Refill  . acetaminophen (TYLENOL) 325 MG tablet Take 325 mg by mouth every 6 (six) hours as needed for mild pain.     Marland Kitchen allopurinol (ZYLOPRIM) 300 MG tablet Take 450 mg by mouth daily.    Marland Kitchen aspirin 81 MG tablet Take 81 mg by mouth every evening.     . carvedilol (COREG) 6.25 MG tablet TAKE 1 TABLET(6.25 MG) BY MOUTH TWICE DAILY 180 tablet 1  . hydroxyurea (HYDREA) 500 MG capsule TAKE 1 CAPSULE(500 MG) BY MOUTH TWICE DAILY. MAY TAKE WITH FOOD TO MINIMIZE GI SIDE EFFECTS 90 capsule 3  . isosorbide mononitrate (IMDUR) 30 MG 24 hr tablet TAKE 1 TABLET(30 MG) BY MOUTH DAILY 90 tablet 2  . levothyroxine (SYNTHROID, LEVOTHROID) 100 MCG tablet   5  . NITROSTAT 0.4 MG SL tablet PLACE 1 TABELT  UNDER THE TONGUE EVERY 5 MINUTES AS NEEDED FOR CHEST PAIN. 25 tablet 3  . simvastatin (ZOCOR) 20 MG tablet Take 20 mg every evening by mouth.     . warfarin (COUMADIN) 5 MG tablet Take 5 mg See admin instructions by mouth. In the evening on Mon/Wed/Fri     No current facility-administered medications for this visit.    Allergies:  Allergies  Allergen Reactions  . Erythromycin Nausea Only       Physical Exam:      Blood pressure 90/72, pulse (!) 105, temperature 97.6 F (36.4 C), temperature source Tympanic, resp. rate 16, height 6' (1.829 m), weight 187 lb 12.8 oz (85.2 kg), SpO2 98 %.     ECOG: 1     General appearance: Alert, awake without any distress. Head: Atraumatic without abnormalities Oropharynx: Without any thrush or ulcers. Eyes: No scleral icterus. Lymph nodes: No lymphadenopathy noted in the cervical, supraclavicular, or axillary nodes Heart:regular rate and rhythm, without any murmurs or gallops.   Lung: Clear to auscultation without any rhonchi, wheezes or dullness to percussion. Abdomin: Soft, nontender without any shifting dullness or ascites. Musculoskeletal: No clubbing or cyanosis. Neurological: No motor or sensory deficits. Skin: No rashes or lesions.     .     Lab Results: Lab Results  Component Value Date   WBC 22.7 (H) 12/14/2020  HGB 16.3 12/14/2020   HCT 50.3 12/14/2020   MCV 90.5 12/14/2020   PLT 425 (H) 12/14/2020     Chemistry      Component Value Date/Time   NA 135 12/14/2020 1309   NA 140 05/09/2017 0844   K 4.3 12/14/2020 1309   K 4.3 05/09/2017 0844   CL 103 12/14/2020 1309   CL 102 02/05/2013 1429   CO2 26 12/14/2020 1309   CO2 25 05/09/2017 0844   BUN 19 12/14/2020 1309   BUN 16.7 05/09/2017 0844   CREATININE 1.28 (H) 12/14/2020 1309   CREATININE 1.2 05/09/2017 0844      Component Value Date/Time   CALCIUM 8.9 12/14/2020 1309   CALCIUM 9.4 05/09/2017 0844   ALKPHOS 76 12/14/2020 1309   ALKPHOS 99  05/09/2017 0844   AST 23 12/14/2020 1309   AST 49 (H) 05/09/2017 0844   ALT 15 12/14/2020 1309   ALT 41 05/09/2017 0844   BILITOT 0.8 12/14/2020 1309   BILITOT 0.55 05/09/2017 0844         Impression and Plan:  80 year old man with:  1.  Polycythemia vera diagnosed in 2013.  He was found to have JAK2 positive myeloproliferative at that time.   His disease status was updated and treatment options were discussed.  He is currently on hydroxyurea without any need for phlebotomy at this time.  Risks and benefits of continuing this medication and dose adjustment possibilities were reviewed.  Complications include pulmonary disease, constitutional symptoms, mucositis among others.  Laboratory data from today reviewed and showed adequate response to hydroxyurea without any further phlebotomy needed.  We will continue to monitor without any adjustment of hydroxyurea at this time.   2. Thrombosis prophylaxis: No evidence of thrombosis noted.  He is currently on warfarin.  3. Leukocytosis and thrombocytosis: Managed better with hydroxyurea.  This is related to his myeloproliferative disorder.  4. Follow-up: In 2 months for repeat follow-up.  30  minutes were spent on this encounter.  The time was dedicated to reviewing his disease status, discussing treatment options, reviewing laboratory data and future plan of care discussion.   Zola Button, MD 5/12/20228:39 AM

## 2021-02-18 ENCOUNTER — Telehealth: Payer: Self-pay | Admitting: Oncology

## 2021-02-18 NOTE — Telephone Encounter (Signed)
Scheduled per 05/12 los, patient has been called and voicemail was left.

## 2021-02-21 DIAGNOSIS — Z7901 Long term (current) use of anticoagulants: Secondary | ICD-10-CM | POA: Diagnosis not present

## 2021-03-05 ENCOUNTER — Other Ambulatory Visit: Payer: Self-pay | Admitting: Cardiovascular Disease

## 2021-03-09 DIAGNOSIS — L570 Actinic keratosis: Secondary | ICD-10-CM | POA: Diagnosis not present

## 2021-03-09 DIAGNOSIS — Z85820 Personal history of malignant melanoma of skin: Secondary | ICD-10-CM | POA: Diagnosis not present

## 2021-03-09 DIAGNOSIS — Z85828 Personal history of other malignant neoplasm of skin: Secondary | ICD-10-CM | POA: Diagnosis not present

## 2021-03-09 DIAGNOSIS — B352 Tinea manuum: Secondary | ICD-10-CM | POA: Diagnosis not present

## 2021-03-12 ENCOUNTER — Other Ambulatory Visit: Payer: Self-pay | Admitting: Oncology

## 2021-03-12 DIAGNOSIS — D45 Polycythemia vera: Secondary | ICD-10-CM

## 2021-03-14 ENCOUNTER — Encounter: Payer: Self-pay | Admitting: Oncology

## 2021-03-14 ENCOUNTER — Other Ambulatory Visit: Payer: Self-pay

## 2021-03-21 DIAGNOSIS — Z7901 Long term (current) use of anticoagulants: Secondary | ICD-10-CM | POA: Diagnosis not present

## 2021-04-08 ENCOUNTER — Telehealth: Payer: Self-pay | Admitting: Cardiovascular Disease

## 2021-04-08 MED ORDER — NITROGLYCERIN 0.4 MG SL SUBL: 0.4000 mg | SUBLINGUAL_TABLET | SUBLINGUAL | 7 refills | Status: DC | PRN

## 2021-04-08 NOTE — Telephone Encounter (Signed)
Refills has been sent to pharmacy. 

## 2021-04-08 NOTE — Telephone Encounter (Signed)
   *  STAT* If patient is at the pharmacy, call can be transferred to refill team.   1. Which medications need to be refilled? (please list name of each medication and dose if known)   NITROSTAT 0.4 MG SL tablet    2. Which pharmacy/location (including street and city if local pharmacy) is medication to be sent to? [place], [place]  3. Do they need a 30 day or 90 day supply? 1 bottle

## 2021-04-18 DIAGNOSIS — Z7901 Long term (current) use of anticoagulants: Secondary | ICD-10-CM | POA: Diagnosis not present

## 2021-04-28 ENCOUNTER — Inpatient Hospital Stay: Payer: Medicare Other | Attending: Oncology

## 2021-04-28 ENCOUNTER — Other Ambulatory Visit: Payer: Self-pay

## 2021-04-28 ENCOUNTER — Inpatient Hospital Stay (HOSPITAL_BASED_OUTPATIENT_CLINIC_OR_DEPARTMENT_OTHER): Payer: Medicare Other | Admitting: Oncology

## 2021-04-28 VITALS — BP 96/67 | HR 60 | Temp 98.1°F | Resp 18 | Ht 72.0 in | Wt 185.1 lb

## 2021-04-28 DIAGNOSIS — D45 Polycythemia vera: Secondary | ICD-10-CM | POA: Insufficient documentation

## 2021-04-28 DIAGNOSIS — Z79899 Other long term (current) drug therapy: Secondary | ICD-10-CM | POA: Diagnosis not present

## 2021-04-28 DIAGNOSIS — Z7901 Long term (current) use of anticoagulants: Secondary | ICD-10-CM | POA: Diagnosis not present

## 2021-04-28 LAB — CBC WITH DIFFERENTIAL (CANCER CENTER ONLY)
Abs Immature Granulocytes: 0.49 10*3/uL — ABNORMAL HIGH (ref 0.00–0.07)
Basophils Absolute: 0.3 10*3/uL — ABNORMAL HIGH (ref 0.0–0.1)
Basophils Relative: 2 %
Eosinophils Absolute: 0.3 10*3/uL (ref 0.0–0.5)
Eosinophils Relative: 2 %
HCT: 49.8 % (ref 39.0–52.0)
Hemoglobin: 16.6 g/dL (ref 13.0–17.0)
Immature Granulocytes: 3 %
Lymphocytes Relative: 13 %
Lymphs Abs: 2.2 10*3/uL (ref 0.7–4.0)
MCH: 29.4 pg (ref 26.0–34.0)
MCHC: 33.3 g/dL (ref 30.0–36.0)
MCV: 88.3 fL (ref 80.0–100.0)
Monocytes Absolute: 1.5 10*3/uL — ABNORMAL HIGH (ref 0.1–1.0)
Monocytes Relative: 9 %
Neutro Abs: 11.9 10*3/uL — ABNORMAL HIGH (ref 1.7–7.7)
Neutrophils Relative %: 71 %
Platelet Count: 418 10*3/uL — ABNORMAL HIGH (ref 150–400)
RBC: 5.64 MIL/uL (ref 4.22–5.81)
RDW: 14.7 % (ref 11.5–15.5)
WBC Count: 16.7 10*3/uL — ABNORMAL HIGH (ref 4.0–10.5)
nRBC: 0 % (ref 0.0–0.2)

## 2021-04-28 LAB — CMP (CANCER CENTER ONLY)
ALT: 16 U/L (ref 0–44)
AST: 22 U/L (ref 15–41)
Albumin: 4 g/dL (ref 3.5–5.0)
Alkaline Phosphatase: 58 U/L (ref 38–126)
Anion gap: 5 (ref 5–15)
BUN: 19 mg/dL (ref 8–23)
CO2: 27 mmol/L (ref 22–32)
Calcium: 8.9 mg/dL (ref 8.9–10.3)
Chloride: 104 mmol/L (ref 98–111)
Creatinine: 1.25 mg/dL — ABNORMAL HIGH (ref 0.61–1.24)
GFR, Estimated: 59 mL/min — ABNORMAL LOW (ref >60)
Glucose, Bld: 87 mg/dL (ref 70–99)
Potassium: 4.3 mmol/L (ref 3.5–5.1)
Sodium: 136 mmol/L (ref 135–145)
Total Bilirubin: 1.1 mg/dL (ref 0.3–1.2)
Total Protein: 6.4 g/dL — ABNORMAL LOW (ref 6.5–8.1)

## 2021-04-28 NOTE — Progress Notes (Signed)
Hematology and Oncology Follow Up Visit  Gregory Blankenship [id number] 04-21-1941 80 y.o. 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     Secondary diagnosis:   Prostate cancer diagnosed in 2011. He was found to have a Gleason score 3+4 = 7 after a prostatectomy completed on 06/20/2010. The final pathology showed a stage T2c disease.   Prior therapies  Phlebotomy to keep his hematocrit less than 45.  Hydroxyurea 500 mg twice a day started in June 2019.  The dose was reduced to 500 mg daily in August 2019.  Current therapy:    Hydroxyurea 500 mg daily except for 1 day he takes 1000 mg.  Interim History: Gregory Blankenship is here for return evaluation.  Since last visit, he reports no major changes in his health.  He denies any recent hospitalization or illnesses.  He denies any bleeding or thrombosis episodes.  Continues to tolerate hydroxyurea without any nausea, vomiting or excessive fatigue.        Medications: Updated on review. Current Outpatient Medications  Medication Sig Dispense Refill   hydroxyurea (HYDREA) 500 MG capsule TAKE 1 CAPSULE(500 MG) BY MOUTH TWICE DAILY. MAY TAKE WITH FOOD TO MINIMIZE GI SIDE EFFECTS 180 capsule 1   acetaminophen (TYLENOL) 325 MG tablet Take 325 mg by mouth every 6 (six) hours as needed for mild pain.      aspirin 81 MG tablet Take 81 mg by mouth every evening.      carvedilol (COREG) 6.25 MG tablet TAKE 1 TABLET BY MOUTH TWICE DAILY 180 tablet 1   isosorbide mononitrate (IMDUR) 30 MG 24 hr tablet TAKE 1 TABLET(30 MG) BY MOUTH DAILY 90 tablet 2   levothyroxine (SYNTHROID, LEVOTHROID) 100 MCG tablet   5   nitroGLYCERIN (NITROSTAT) 0.4 MG SL tablet Place 1 tablet (0.4 mg total) under the tongue every 5 (five) minutes as needed for chest pain. 25 tablet 7   simvastatin (ZOCOR) 20 MG tablet Take 20 mg every evening by mouth.      warfarin (COUMADIN) 5 MG tablet Take 5 mg  See admin instructions by mouth. In the evening on Mon/Wed/Fri     No current facility-administered medications for this visit.    Allergies:  Allergies  Allergen Reactions   Erythromycin Nausea Only       Physical Exam:       Blood pressure 96/67, pulse 60, temperature 98.1 F (36.7 C), temperature source Oral, resp. rate 18, height 6' (1.829 m), weight 185 lb 1.6 oz (84 kg), SpO2 99 %.     ECOG: 1   General appearance: Comfortable appearing without any discomfort Head: Normocephalic without any trauma Oropharynx: Mucous membranes are moist and pink without any thrush or ulcers. Eyes: Pupils are equal and round reactive to light. Lymph nodes: No cervical, supraclavicular, inguinal or axillary lymphadenopathy.   Heart:regular rate and rhythm.  S1 and S2 without leg edema. Lung: Clear without any rhonchi or wheezes.  No dullness to percussion. Abdomin: Soft, nontender, nondistended with good bowel sounds.  No hepatosplenomegaly. Musculoskeletal: No joint deformity or effusion.  Full range of motion noted. Neurological: No deficits noted on motor, sensory and deep tendon reflex exam. Skin: No petechial rash or dryness.  Appeared moist.      .     Lab Results: Lab Results  Component Value Date   WBC 15.8 (H) 02/17/2021   HGB 17.1 (H) 02/17/2021   HCT 53.5 (H)  02/17/2021   MCV 92.1 02/17/2021   PLT 358 02/17/2021     Chemistry      Component Value Date/Time   NA 139 02/17/2021 0850   NA 140 05/09/2017 0844   K 4.8 02/17/2021 0850   K 4.3 05/09/2017 0844   CL 102 02/17/2021 0850   CL 102 02/05/2013 1429   CO2 28 02/17/2021 0850   CO2 25 05/09/2017 0844   BUN 15 02/17/2021 0850   BUN 16.7 05/09/2017 0844   CREATININE 1.21 02/17/2021 0850   CREATININE 1.2 05/09/2017 0844      Component Value Date/Time   CALCIUM 9.2 02/17/2021 0850   CALCIUM 9.4 05/09/2017 0844   ALKPHOS 70 02/17/2021 0850   ALKPHOS 99 05/09/2017 0844   AST 20 02/17/2021 0850    AST 49 (H) 05/09/2017 0844   ALT 10 02/17/2021 0850   ALT 41 05/09/2017 0844   BILITOT 0.6 02/17/2021 0850   BILITOT 0.55 05/09/2017 0844         Impression and Plan:  80 year old man with:  1.  JAK2 positive polycythemia vera diagnosed in 2013.    He is currently on hydroxyurea with reasonable control of his hematological parameters.  His white cell count, platelets are reasonably controlled although his hemoglobin still slightly up.  Risks and benefits of continuing hydroxyurea and the role of adding therapeutic phlebotomy was also discussed.  Hemoglobin reviewed today and appears adequate and will hold off on any phlebotomy for the time being.  2. Thrombosis prophylaxis: He is currently on warfarin without any recent thrombosis or bleeding episodes.  3. Leukocytosis and thrombocytosis: Controlled with hydroxyurea without any dose adjustment needed.  4. Follow-up: He will return in 3 months for repeat follow-up.  30  minutes were dedicated to this visit.  The time was spent on reviewing laboratory data, disease status update, treatment choices and complications of therapy discussion.   Zola Button, MD 7/21/20229:53 AM

## 2021-05-03 ENCOUNTER — Other Ambulatory Visit: Payer: Self-pay | Admitting: Oncology

## 2021-05-03 DIAGNOSIS — D45 Polycythemia vera: Secondary | ICD-10-CM

## 2021-05-04 DIAGNOSIS — B354 Tinea corporis: Secondary | ICD-10-CM | POA: Diagnosis not present

## 2021-05-12 DIAGNOSIS — Z7901 Long term (current) use of anticoagulants: Secondary | ICD-10-CM | POA: Diagnosis not present

## 2021-07-20 ENCOUNTER — Inpatient Hospital Stay: Payer: Medicare Other

## 2021-07-20 ENCOUNTER — Inpatient Hospital Stay: Payer: Medicare Other | Attending: Oncology

## 2021-07-20 ENCOUNTER — Inpatient Hospital Stay: Payer: Medicare Other | Admitting: Oncology

## 2021-07-20 ENCOUNTER — Other Ambulatory Visit: Payer: Self-pay

## 2021-07-20 VITALS — BP 94/69 | HR 54 | Temp 98.0°F | Resp 17 | Ht 72.0 in | Wt 186.2 lb

## 2021-07-20 DIAGNOSIS — D75839 Thrombocytosis, unspecified: Secondary | ICD-10-CM | POA: Insufficient documentation

## 2021-07-20 DIAGNOSIS — D45 Polycythemia vera: Secondary | ICD-10-CM

## 2021-07-20 LAB — CMP (CANCER CENTER ONLY)
ALT: 14 U/L (ref 0–44)
AST: 24 U/L (ref 15–41)
Albumin: 4.2 g/dL (ref 3.5–5.0)
Alkaline Phosphatase: 64 U/L (ref 38–126)
Anion gap: 6 (ref 5–15)
BUN: 20 mg/dL (ref 8–23)
CO2: 26 mmol/L (ref 22–32)
Calcium: 8.9 mg/dL (ref 8.9–10.3)
Chloride: 103 mmol/L (ref 98–111)
Creatinine: 1.11 mg/dL (ref 0.61–1.24)
GFR, Estimated: >60 mL/min (ref >60)
Glucose, Bld: 89 mg/dL (ref 70–99)
Potassium: 4.4 mmol/L (ref 3.5–5.1)
Sodium: 135 mmol/L (ref 135–145)
Total Bilirubin: 1.1 mg/dL (ref 0.3–1.2)
Total Protein: 6.8 g/dL (ref 6.5–8.1)

## 2021-07-20 LAB — CBC WITH DIFFERENTIAL (CANCER CENTER ONLY)
Abs Immature Granulocytes: 0.81 10*3/uL — ABNORMAL HIGH (ref 0.00–0.07)
Basophils Absolute: 0.5 10*3/uL — ABNORMAL HIGH (ref 0.0–0.1)
Basophils Relative: 2 %
Eosinophils Absolute: 0.5 10*3/uL (ref 0.0–0.5)
Eosinophils Relative: 2 %
HCT: 50.9 % (ref 39.0–52.0)
Hemoglobin: 16.8 g/dL (ref 13.0–17.0)
Immature Granulocytes: 4 %
Lymphocytes Relative: 13 %
Lymphs Abs: 2.7 10*3/uL (ref 0.7–4.0)
MCH: 29.5 pg (ref 26.0–34.0)
MCHC: 33 g/dL (ref 30.0–36.0)
MCV: 89.5 fL (ref 80.0–100.0)
Monocytes Absolute: 1.9 10*3/uL — ABNORMAL HIGH (ref 0.1–1.0)
Monocytes Relative: 9 %
Neutro Abs: 14.5 10*3/uL — ABNORMAL HIGH (ref 1.7–7.7)
Neutrophils Relative %: 70 %
Platelet Count: 478 10*3/uL — ABNORMAL HIGH (ref 150–400)
RBC: 5.69 MIL/uL (ref 4.22–5.81)
RDW: 17 % — ABNORMAL HIGH (ref 11.5–15.5)
WBC Count: 20.8 10*3/uL — ABNORMAL HIGH (ref 4.0–10.5)
nRBC: 0 % (ref 0.0–0.2)

## 2021-07-20 NOTE — Progress Notes (Signed)
Hematology and Oncology Follow Up Visit  Gregory Blankenship [id number] [birth year] 80 y.o. 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  He presented with JAK2 positive polycythemia vera.   Secondary diagnosis:   Prostate cancer diagnosed in 2011. He was found to have a Gleason score 3+4 = 7 after a prostatectomy completed on 06/20/2010. The final pathology showed a stage T2c disease.   Prior therapies  Phlebotomy to keep his hematocrit less than 45.  Hydroxyurea 500 mg twice a day started in June 2019.  The dose was reduced to 500 mg daily in August 2019.  Current therapy:    Hydroxyurea 500 mg daily except for 1 day he takes 1000 mg.  Interim History: Mr. Gregory Blankenship is here for a follow-up visit.  Since the last visit, he reports no major changes in his health.  He continues to tolerate hydroxyurea without any complaints.  He denies any nausea, fatigue or tiredness.  He denies any recent hospitalizations or illnesses.  He denies any bleeding or thrombosis episodes.        Medications: Reviewed without changes. Current Outpatient Medications  Medication Sig Dispense Refill   acetaminophen (TYLENOL) 325 MG tablet Take 325 mg by mouth every 6 (six) hours as needed for mild pain.      aspirin 81 MG tablet Take 81 mg by mouth every evening.      carvedilol (COREG) 6.25 MG tablet TAKE 1 TABLET BY MOUTH TWICE DAILY 180 tablet 1   hydroxyurea (HYDREA) 500 MG capsule TAKE 1 CAPSULE(500 MG) BY MOUTH TWICE DAILY. MAY TAKE WITH FOOD TO MINIMIZE GI SIDE EFFECTS 270 capsule 3   isosorbide mononitrate (IMDUR) 30 MG 24 hr tablet TAKE 1 TABLET(30 MG) BY MOUTH DAILY 90 tablet 2   levothyroxine (SYNTHROID, LEVOTHROID) 100 MCG tablet   5   nitroGLYCERIN (NITROSTAT) 0.4 MG SL tablet Place 1 tablet (0.4 mg total) under the tongue every 5 (five) minutes as needed for chest pain. 25 tablet 7   simvastatin (ZOCOR) 20 MG tablet Take  20 mg every evening by mouth.      warfarin (COUMADIN) 5 MG tablet Take 5 mg See admin instructions by mouth. In the evening on Mon/Wed/Fri     No current facility-administered medications for this visit.    Allergies:  Allergies  Allergen Reactions   Erythromycin Nausea Only       Physical Exam:       Blood pressure 94/69, pulse (!) 54, temperature 98 F (36.7 C), temperature source Tympanic, resp. rate 17, height 6' (1.829 m), weight 186 lb 3.2 oz (84.5 kg), SpO2 100 %.      ECOG: 1     General appearance: Alert, awake without any distress. Head: Atraumatic without abnormalities Oropharynx: Without any thrush or ulcers. Eyes: No scleral icterus. Lymph nodes: No lymphadenopathy noted in the cervical, supraclavicular, or axillary nodes Heart:regular rate and rhythm, without any murmurs or gallops.   Lung: Clear to auscultation without any rhonchi, wheezes or dullness to percussion. Abdomin: Soft, nontender without any shifting dullness or ascites. Musculoskeletal: No clubbing or cyanosis. Neurological: No motor or sensory deficits. Skin: No rashes or lesions.      .     Lab Results: Lab Results  Component Value Date   WBC 16.7 (H) 04/28/2021   HGB 16.6 04/28/2021   HCT 49.8 04/28/2021   MCV 88.3 04/28/2021   PLT 418 (H) 04/28/2021     Chemistry  Component Value Date/Time   NA 136 04/28/2021 0959   NA 140 05/09/2017 0844   K 4.3 04/28/2021 0959   K 4.3 05/09/2017 0844   CL 104 04/28/2021 0959   CL 102 02/05/2013 1429   CO2 27 04/28/2021 0959   CO2 25 05/09/2017 0844   BUN 19 04/28/2021 0959   BUN 16.7 05/09/2017 0844   CREATININE 1.25 (H) 04/28/2021 0959   CREATININE 1.2 05/09/2017 0844      Component Value Date/Time   CALCIUM 8.9 04/28/2021 0959   CALCIUM 9.4 05/09/2017 0844   ALKPHOS 58 04/28/2021 0959   ALKPHOS 99 05/09/2017 0844   AST 22 04/28/2021 0959   AST 49 (H) 05/09/2017 0844   ALT 16 04/28/2021 0959   ALT 41  05/09/2017 0844   BILITOT 1.1 04/28/2021 0959   BILITOT 0.55 05/09/2017 0844         Impression and Plan:  80 year old man with:  1.  Myeloproliferative disorder presented with erythrocytosis in 2013.  He was found to have JAK2 positive polycythemia vera.   He had continues to have adequate control of his counts with hydroxyurea without any need for dose adjustment or phlebotomy.  Risks and benefits of continuing this treatment long-term were reviewed.  Potential complications including myelosuppression and GI toxicity were reiterated.  He is agreeable to continue at this time.  Laboratory data from today showed mild increase in his counts but he has missed few doses since the last visit.  2. Thrombosis prophylaxis: No recent thrombosis or bleeding episodes noted.  3. Leukocytosis and thrombocytosis: Related to his myeloproliferative disorder.  His counts today showed slight elevation but overall under control.  This is attributed to missed doses which will continue to address and monitor for the future.  4. Follow-up: In 3 months for repeat follow-up.  30  minutes were spent on this encounter.  Time was dedicated to reviewing laboratory data, treatment choices and complications related to therapy.   Zola Button, MD 10/12/20229:17 AM

## 2021-07-29 DIAGNOSIS — Z7901 Long term (current) use of anticoagulants: Secondary | ICD-10-CM | POA: Diagnosis not present

## 2021-08-12 ENCOUNTER — Other Ambulatory Visit: Payer: Self-pay

## 2021-08-12 ENCOUNTER — Ambulatory Visit: Payer: Medicare Other | Admitting: Cardiovascular Disease

## 2021-08-12 ENCOUNTER — Encounter: Payer: Self-pay | Admitting: Cardiovascular Disease

## 2021-08-12 VITALS — BP 108/76 | HR 54 | Ht 72.0 in | Wt 186.6 lb

## 2021-08-12 DIAGNOSIS — I10 Essential (primary) hypertension: Secondary | ICD-10-CM

## 2021-08-12 DIAGNOSIS — I2510 Atherosclerotic heart disease of native coronary artery without angina pectoris: Secondary | ICD-10-CM

## 2021-08-12 DIAGNOSIS — I2699 Other pulmonary embolism without acute cor pulmonale: Secondary | ICD-10-CM

## 2021-08-12 DIAGNOSIS — I2102 ST elevation (STEMI) myocardial infarction involving left anterior descending coronary artery: Secondary | ICD-10-CM

## 2021-08-12 DIAGNOSIS — I255 Ischemic cardiomyopathy: Secondary | ICD-10-CM

## 2021-08-12 DIAGNOSIS — E782 Mixed hyperlipidemia: Secondary | ICD-10-CM

## 2021-08-12 NOTE — Assessment & Plan Note (Signed)
History of hyperlipidemia on statin therapy with lipid profile performed 11/17/2020 revealing total cholesterol of 120, LDL 64 and HDL 37.

## 2021-08-12 NOTE — Assessment & Plan Note (Signed)
History of essential hypertension with blood pressure measured today of 108/76.  He is on carvedilol.

## 2021-08-12 NOTE — Assessment & Plan Note (Signed)
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  He was on Coumadin at that time.  I performed radial diagnostic catheter revealing an occluded LAD which I could not cross suggesting it was "a CTO.  He was treated medically.  He has had no recurrent symptoms.

## 2021-08-12 NOTE — Progress Notes (Signed)
08/12/2021 Gregory Blankenship   09-29-1941  [id number]  Primary Physician Seward Carol, MD Primary Cardiologist: Lorretta Harp MD FACP, [place], [place], [place]  HPI:  Gregory Blankenship. is a 80 y.o.   thin and fit-appearing, married Caucasian male, father of 2, grandfather to 5 grandchildren who I last saw in the office 08/11/2020. He has a history of an anterior ST-segment-elevation myocardial infarction December 02, 2011. He was on Coumadin anticoagulation because of prior pulmonary embolus. I cath'd him radially revealing a left dominant system with an occluded mid LAD. I tried to percutaneously recanalize him. However, this was unsuccessful probably because this was a "CTO." He did have anterior wall motion abnormalities and EF of 30% and anteroapical dyskinesia which ultimately improved over time to an EF of 50% by 2D echo in June of 2014. He is completely asymptomatic. He participated in cardiac rehab. His lipid profile was excellent 08/10/15 with a total cholesterol of 133, LDL 85 and HDL of 31. Since I saw him one year ago he denies chest pain or shortness of breath. He has a daughter who lives in [place] who he visits several times a year.     Since I saw him in the office a year ago he continues to do well.  He did spend 2 months visiting his daughter in [place].  He has had no cardiovascular symptoms, denies chest pain or shortness of breath.   Current Meds  Medication Sig   acetaminophen (TYLENOL) 325 MG tablet Take 325 mg by mouth every 6 (six) hours as needed for mild pain.    aspirin 81 MG tablet Take 81 mg by mouth every evening.    carvedilol (COREG) 6.25 MG tablet TAKE 1 TABLET BY MOUTH TWICE DAILY   hydroxyurea (HYDREA) 500 MG capsule TAKE 1 CAPSULE(500 MG) BY MOUTH TWICE DAILY. MAY TAKE WITH FOOD TO MINIMIZE GI SIDE EFFECTS   isosorbide mononitrate (IMDUR) 30 MG 24 hr tablet TAKE 1 TABLET(30 MG) BY MOUTH DAILY   levothyroxine (SYNTHROID, LEVOTHROID) 100 MCG tablet     nitroGLYCERIN (NITROSTAT) 0.4 MG SL tablet Place 1 tablet (0.4 mg total) under the tongue every 5 (five) minutes as needed for chest pain.   simvastatin (ZOCOR) 20 MG tablet Take 20 mg every evening by mouth.    warfarin (COUMADIN) 5 MG tablet Take 5 mg See admin instructions by mouth. In the evening on Mon/Wed/Fri     Allergies  Allergen Reactions   Erythromycin Nausea Only    Social History   Socioeconomic History   Marital status: Married    Spouse name: [name]   Number of children: 2   Years of education: college   Highest education level: Not on file  Occupational History   Not on file  Tobacco Use   Smoking status: Former    Types: Cigarettes    Quit date: 12/01/1968    Years since quitting: 52.7   Smokeless tobacco: Never  Vaping Use   Vaping Use: Never used  Substance and Sexual Activity   Alcohol use: No    Comment: hx of 5-6 years ago an occasional beer or wine   Drug use: No   Sexual activity: Not on file  Other Topics Concern   Not on file  Social History Narrative   Lives with wife, [name]   Caffeine use: very little   Right handed    Social Determinants of Health   Financial Resource Strain: Not on file  Food Insecurity: Not on file  Transportation Needs: Not on file  Physical Activity: Not on file  Stress: Not on file  Social Connections: Not on file  Intimate Partner Violence: Not on file     Review of Systems: General: negative for chills, fever, night sweats or weight changes.  Cardiovascular: negative for chest pain, dyspnea on exertion, edema, orthopnea, palpitations, paroxysmal nocturnal dyspnea or shortness of breath Dermatological: negative for rash Respiratory: negative for cough or wheezing Urologic: negative for hematuria Abdominal: negative for nausea, vomiting, diarrhea, bright red blood per rectum, melena, or hematemesis Neurologic: negative for visual changes, syncope, or dizziness All other systems reviewed and are otherwise  negative except as noted above.    Blood pressure 108/76, pulse (!) 54, height 6' (1.829 m), weight 186 lb 9.6 oz (84.6 kg), SpO2 96 %.  General appearance: alert and no distress Neck: no adenopathy, no carotid bruit, no JVD, supple, symmetrical, trachea midline, and thyroid not enlarged, symmetric, no tenderness/mass/nodules Lungs: clear to auscultation bilaterally Heart: regular rate and rhythm, S1, S2 normal, no murmur, click, rub or gallop Extremities: extremities normal, atraumatic, no cyanosis or edema Pulses: 2+ and symmetric Skin: Skin color, texture, turgor normal. No rashes or lesions Neurologic: Grossly normal  EKG sinus bradycardia 54 with septal Q waves consistent with old anteroseptal myocardial infarction low limb voltage.  I personally reviewed this EKG.  ASSESSMENT AND PLAN:   Pulmonary embolism, 9/11 after prostate surgery History of pulmonary embolism in the past on warfarin oral anticoagulation.  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  He was on Coumadin at that time.  I performed radial diagnostic catheter revealing an occluded LAD which I could not cross suggesting it was "a CTO.  He was treated medically.  He has had no recurrent symptoms.  Cardiomyopathy, ischemic, EF 35-40% by 2D 2/26 History of ischemic cardiomyopathy with an EF initially of 30% at the time of his "anterior STEMI.  This ultimately improved to 40 to 45% on 09/20/2017 by 2D echo.  Essential hypertension History of essential hypertension with blood pressure measured today of 108/76.  He is on carvedilol.  Hyperlipidemia History of hyperlipidemia on statin therapy with lipid profile performed 11/17/2020 revealing total cholesterol of 120, LDL 64 and HDL 37.     Lorretta Harp MD [facility],  [facility] 08/12/2021 8:28 AM

## 2021-08-12 NOTE — Assessment & Plan Note (Signed)
History of ischemic cardiomyopathy with an EF initially of 30% at the time of his "anterior STEMI.  This ultimately improved to 40 to 45% on 09/20/2017 by 2D echo.

## 2021-08-12 NOTE — Assessment & Plan Note (Signed)
History of pulmonary embolism in the past on warfarin oral anticoagulation.

## 2021-08-12 NOTE — Patient Instructions (Signed)

## 2021-08-15 ENCOUNTER — Other Ambulatory Visit: Payer: Self-pay | Admitting: Cardiovascular Disease

## 2021-08-16 DIAGNOSIS — D0439 Carcinoma in situ of skin of other parts of face: Secondary | ICD-10-CM | POA: Diagnosis not present

## 2021-08-16 DIAGNOSIS — Z85828 Personal history of other malignant neoplasm of skin: Secondary | ICD-10-CM | POA: Diagnosis not present

## 2021-08-16 DIAGNOSIS — L570 Actinic keratosis: Secondary | ICD-10-CM | POA: Diagnosis not present

## 2021-08-16 DIAGNOSIS — Z85820 Personal history of malignant melanoma of skin: Secondary | ICD-10-CM | POA: Diagnosis not present

## 2021-08-16 DIAGNOSIS — L821 Other seborrheic keratosis: Secondary | ICD-10-CM | POA: Diagnosis not present

## 2021-09-06 DIAGNOSIS — Z7901 Long term (current) use of anticoagulants: Secondary | ICD-10-CM | POA: Diagnosis not present

## 2021-09-12 DIAGNOSIS — D0339 Melanoma in situ of other parts of face: Secondary | ICD-10-CM | POA: Diagnosis not present

## 2021-09-12 DIAGNOSIS — Z85820 Personal history of malignant melanoma of skin: Secondary | ICD-10-CM | POA: Diagnosis not present

## 2021-09-12 DIAGNOSIS — Z85828 Personal history of other malignant neoplasm of skin: Secondary | ICD-10-CM | POA: Diagnosis not present

## 2021-09-16 DIAGNOSIS — Z7901 Long term (current) use of anticoagulants: Secondary | ICD-10-CM | POA: Diagnosis not present

## 2021-10-12 DIAGNOSIS — Z7901 Long term (current) use of anticoagulants: Secondary | ICD-10-CM | POA: Diagnosis not present

## 2021-10-13 ENCOUNTER — Telehealth: Payer: Self-pay | Admitting: Oncology

## 2021-10-13 NOTE — Telephone Encounter (Signed)
Rescheduled 01/13 appointment time due to providers request. Patient has been called and notified.

## 2021-10-19 ENCOUNTER — Other Ambulatory Visit: Payer: Self-pay | Admitting: Cardiovascular Disease

## 2021-10-21 ENCOUNTER — Inpatient Hospital Stay: Payer: Medicare Other | Admitting: Oncology

## 2021-10-21 ENCOUNTER — Inpatient Hospital Stay: Payer: Medicare Other

## 2021-10-21 ENCOUNTER — Other Ambulatory Visit: Payer: Self-pay

## 2021-10-21 ENCOUNTER — Inpatient Hospital Stay: Payer: Medicare Other | Attending: Oncology

## 2021-10-21 VITALS — BP 106/74 | HR 69 | Temp 97.9°F | Resp 17 | Ht 72.0 in | Wt 187.7 lb

## 2021-10-21 DIAGNOSIS — Z79899 Other long term (current) drug therapy: Secondary | ICD-10-CM | POA: Insufficient documentation

## 2021-10-21 DIAGNOSIS — Z7982 Long term (current) use of aspirin: Secondary | ICD-10-CM | POA: Insufficient documentation

## 2021-10-21 DIAGNOSIS — Z7901 Long term (current) use of anticoagulants: Secondary | ICD-10-CM | POA: Diagnosis not present

## 2021-10-21 DIAGNOSIS — Z8546 Personal history of malignant neoplasm of prostate: Secondary | ICD-10-CM | POA: Insufficient documentation

## 2021-10-21 DIAGNOSIS — D45 Polycythemia vera: Secondary | ICD-10-CM | POA: Diagnosis not present

## 2021-10-21 LAB — CBC WITH DIFFERENTIAL (CANCER CENTER ONLY)
Abs Immature Granulocytes: 0.89 10*3/uL — ABNORMAL HIGH (ref 0.00–0.07)
Basophils Absolute: 0.5 10*3/uL — ABNORMAL HIGH (ref 0.0–0.1)
Basophils Relative: 2 %
Eosinophils Absolute: 0.5 10*3/uL (ref 0.0–0.5)
Eosinophils Relative: 2 %
HCT: 50.7 % (ref 39.0–52.0)
Hemoglobin: 16.3 g/dL (ref 13.0–17.0)
Immature Granulocytes: 4 %
Lymphocytes Relative: 12 %
Lymphs Abs: 2.8 10*3/uL (ref 0.7–4.0)
MCH: 29.9 pg (ref 26.0–34.0)
MCHC: 32.1 g/dL (ref 30.0–36.0)
MCV: 92.9 fL (ref 80.0–100.0)
Monocytes Absolute: 2.5 10*3/uL — ABNORMAL HIGH (ref 0.1–1.0)
Monocytes Relative: 11 %
Neutro Abs: 15.4 10*3/uL — ABNORMAL HIGH (ref 1.7–7.7)
Neutrophils Relative %: 69 %
Platelet Count: 583 10*3/uL — ABNORMAL HIGH (ref 150–400)
RBC: 5.46 MIL/uL (ref 4.22–5.81)
RDW: 16.5 % — ABNORMAL HIGH (ref 11.5–15.5)
WBC Count: 22.5 10*3/uL — ABNORMAL HIGH (ref 4.0–10.5)
nRBC: 0 % (ref 0.0–0.2)

## 2021-10-21 LAB — CMP (CANCER CENTER ONLY)
ALT: 12 U/L (ref 0–44)
AST: 20 U/L (ref 15–41)
Albumin: 4.1 g/dL (ref 3.5–5.0)
Alkaline Phosphatase: 66 U/L (ref 38–126)
Anion gap: 5 (ref 5–15)
BUN: 18 mg/dL (ref 8–23)
CO2: 28 mmol/L (ref 22–32)
Calcium: 8.9 mg/dL (ref 8.9–10.3)
Chloride: 103 mmol/L (ref 98–111)
Creatinine: 1.27 mg/dL — ABNORMAL HIGH (ref 0.61–1.24)
GFR, Estimated: 58 mL/min — ABNORMAL LOW (ref >60)
Glucose, Bld: 79 mg/dL (ref 70–99)
Potassium: 4.5 mmol/L (ref 3.5–5.1)
Sodium: 136 mmol/L (ref 135–145)
Total Bilirubin: 0.7 mg/dL (ref 0.3–1.2)
Total Protein: 6.7 g/dL (ref 6.5–8.1)

## 2021-10-21 NOTE — Progress Notes (Signed)
Hematology and Oncology Follow Up Visit  Gregory Blankenship [id number] Jan 03, 1941 81 y.o. 10/21/2021 10:21 AM   Principle Diagnosis: 81 year old man with JAK2 positive myeloproliferative disorder presenting with erythrocytosis in February 2013.     Secondary diagnosis:   Prostate cancer diagnosed in 2011. He was found to have a Gleason score 3+4 = 7 after a prostatectomy completed on 06/20/2010. The final pathology showed a stage T2c disease.   Prior therapies  Phlebotomy to keep his hematocrit less than 45.  Hydroxyurea 500 mg twice a day started in June 2019.  The dose was reduced to 500 mg daily in August 2019.  Current therapy:    Hydroxyurea 500 mg daily except for 1 day he takes 1000 mg.  Interim History: Gregory Blankenship returns today for repeat evaluation.  Since the last visit, he reports no major changes in his health.  He continues to tolerate hydroxyurea without any complaints.  He denies any nausea, vomiting or abdominal pain.  He denies any hospitalizations or illnesses.  He denies any bleeding or thrombosis episodes.        Medications: Updated on review. Current Outpatient Medications  Medication Sig Dispense Refill   acetaminophen (TYLENOL) 325 MG tablet Take 325 mg by mouth every 6 (six) hours as needed for mild pain.      aspirin 81 MG tablet Take 81 mg by mouth every evening.      carvedilol (COREG) 6.25 MG tablet TAKE 1 TABLET BY MOUTH TWICE DAILY 180 tablet 1   hydroxyurea (HYDREA) 500 MG capsule TAKE 1 CAPSULE(500 MG) BY MOUTH TWICE DAILY. MAY TAKE WITH FOOD TO MINIMIZE GI SIDE EFFECTS 270 capsule 3   isosorbide mononitrate (IMDUR) 30 MG 24 hr tablet TAKE 1 TABLET(30 MG) BY MOUTH DAILY 90 tablet 2   levothyroxine (SYNTHROID, LEVOTHROID) 100 MCG tablet   5   nitroGLYCERIN (NITROSTAT) 0.4 MG SL tablet Place 1 tablet (0.4 mg total) under the tongue every 5 (five) minutes as needed for chest pain. 25 tablet 7   simvastatin (ZOCOR) 20 MG tablet Take 20 mg every  evening by mouth.      warfarin (COUMADIN) 5 MG tablet Take 5 mg See admin instructions by mouth. In the evening on Mon/Wed/Fri     No current facility-administered medications for this visit.    Allergies:  Allergies  Allergen Reactions   Erythromycin Nausea Only       Physical Exam:       Blood pressure 106/74, pulse 69, temperature 97.9 F (36.6 C), temperature source Temporal, resp. rate 17, height 6' (1.829 m), weight 187 lb 11.2 oz (85.1 kg), SpO2 98 %.       ECOG: 1   General appearance: Comfortable appearing without any discomfort Head: Normocephalic without any trauma Oropharynx: Mucous membranes are moist and pink without any thrush or ulcers. Eyes: Pupils are equal and round reactive to light. Lymph nodes: No cervical, supraclavicular, inguinal or axillary lymphadenopathy.   Heart:regular rate and rhythm.  S1 and S2 without leg edema. Lung: Clear without any rhonchi or wheezes.  No dullness to percussion. Abdomin: Soft, nontender, nondistended with good bowel sounds.  No hepatosplenomegaly. Musculoskeletal: No joint deformity or effusion.  Full range of motion noted. Neurological: No deficits noted on motor, sensory and deep tendon reflex exam. Skin: No petechial rash or dryness.  Appeared moist.       .     Lab Results: Lab Results  Component Value Date   WBC 20.8 (H) 07/20/2021  HGB 16.8 07/20/2021   HCT 50.9 07/20/2021   MCV 89.5 07/20/2021   PLT 478 (H) 07/20/2021     Chemistry      Component Value Date/Time   NA 135 07/20/2021 0921   NA 140 05/09/2017 0844   K 4.4 07/20/2021 0921   K 4.3 05/09/2017 0844   CL 103 07/20/2021 0921   CL 102 02/05/2013 1429   CO2 26 07/20/2021 0921   CO2 25 05/09/2017 0844   BUN 20 07/20/2021 0921   BUN 16.7 05/09/2017 0844   CREATININE 1.11 07/20/2021 0921   CREATININE 1.2 05/09/2017 0844      Component Value Date/Time   CALCIUM 8.9 07/20/2021 0921   CALCIUM 9.4 05/09/2017 0844   ALKPHOS  64 07/20/2021 0921   ALKPHOS 99 05/09/2017 0844   AST 24 07/20/2021 0921   AST 49 (H) 05/09/2017 0844   ALT 14 07/20/2021 0921   ALT 41 05/09/2017 0844   BILITOT 1.1 07/20/2021 0921   BILITOT 0.55 05/09/2017 0844         Impression and Plan:  81 year old man with:  1.  JAK2 positive polycythemia vera diagnosed in 2013.    He is currently on hydroxyurea without any major complications.  Risks and benefits of continuing this treatment were discussed at this time.  The role for additional therapeutic phlebotomy were reiterated currently.  Adjusting hydroxyurea dosing was also reviewed.  Laboratory data from today continues to show mild elevation in the white cell count and platelets and persistent elevation in his hemoglobin.  After discussion today, I recommended increasing his hydroxyurea dosing to 1000 mg 3 days a week and continuing 500 mg 4 days a week.  2. Thrombosis prophylaxis: He is currently on full dose anticoagulation without any recent thrombosis episodes.  3. Leukocytosis and thrombocytosis: His counts currently controlled by hydroxyurea without any additional intervention.  4. Follow-up: He will return in 3 months for a follow-up visit.  30  minutes were dedicated to this visit.  The time was spent on reviewing laboratory data, disease status update complications related to his condition and his therapy.   Zola Button, MD 1/13/202310:21 AM

## 2021-10-28 ENCOUNTER — Telehealth: Payer: Self-pay

## 2021-10-28 NOTE — Telephone Encounter (Signed)
Pt advised via voice message

## 2021-10-28 NOTE — Telephone Encounter (Signed)
T/C from pt stating he forgot to mention at his last appointment that he has been having night sweats. Bed linens are damp but he has to change his T shirt. This started at the first of December

## 2021-11-09 DIAGNOSIS — Z7901 Long term (current) use of anticoagulants: Secondary | ICD-10-CM | POA: Diagnosis not present

## 2021-12-08 DIAGNOSIS — Z7901 Long term (current) use of anticoagulants: Secondary | ICD-10-CM | POA: Diagnosis not present

## 2021-12-15 DIAGNOSIS — Z85828 Personal history of other malignant neoplasm of skin: Secondary | ICD-10-CM | POA: Diagnosis not present

## 2021-12-15 DIAGNOSIS — L821 Other seborrheic keratosis: Secondary | ICD-10-CM | POA: Diagnosis not present

## 2021-12-15 DIAGNOSIS — L853 Xerosis cutis: Secondary | ICD-10-CM | POA: Diagnosis not present

## 2021-12-15 DIAGNOSIS — Z85820 Personal history of malignant melanoma of skin: Secondary | ICD-10-CM | POA: Diagnosis not present

## 2021-12-29 ENCOUNTER — Other Ambulatory Visit: Payer: Self-pay | Admitting: Cardiovascular Disease

## 2022-01-04 ENCOUNTER — Inpatient Hospital Stay: Payer: Medicare Other | Admitting: Oncology

## 2022-01-04 ENCOUNTER — Inpatient Hospital Stay: Payer: Medicare Other | Attending: Oncology

## 2022-01-04 ENCOUNTER — Other Ambulatory Visit: Payer: Self-pay

## 2022-01-04 VITALS — BP 121/73 | HR 60 | Temp 97.7°F | Resp 18 | Ht 72.0 in | Wt 193.3 lb

## 2022-01-04 DIAGNOSIS — D72829 Elevated white blood cell count, unspecified: Secondary | ICD-10-CM | POA: Diagnosis not present

## 2022-01-04 DIAGNOSIS — Z79899 Other long term (current) drug therapy: Secondary | ICD-10-CM | POA: Diagnosis not present

## 2022-01-04 DIAGNOSIS — D45 Polycythemia vera: Secondary | ICD-10-CM | POA: Diagnosis not present

## 2022-01-04 DIAGNOSIS — D75839 Thrombocytosis, unspecified: Secondary | ICD-10-CM | POA: Insufficient documentation

## 2022-01-04 LAB — CMP (CANCER CENTER ONLY)
ALT: 18 U/L (ref 0–44)
AST: 27 U/L (ref 15–41)
Albumin: 4.4 g/dL (ref 3.5–5.0)
Alkaline Phosphatase: 64 U/L (ref 38–126)
Anion gap: 8 (ref 5–15)
BUN: 26 mg/dL — ABNORMAL HIGH (ref 8–23)
CO2: 27 mmol/L (ref 22–32)
Calcium: 9.1 mg/dL (ref 8.9–10.3)
Chloride: 101 mmol/L (ref 98–111)
Creatinine: 1.18 mg/dL (ref 0.61–1.24)
GFR, Estimated: >60 mL/min (ref >60)
Glucose, Bld: 93 mg/dL (ref 70–99)
Potassium: 4.4 mmol/L (ref 3.5–5.1)
Sodium: 136 mmol/L (ref 135–145)
Total Bilirubin: 0.5 mg/dL (ref 0.3–1.2)
Total Protein: 7.4 g/dL (ref 6.5–8.1)

## 2022-01-04 LAB — CBC WITH DIFFERENTIAL (CANCER CENTER ONLY)
Abs Immature Granulocytes: 0.52 10*3/uL — ABNORMAL HIGH (ref 0.00–0.07)
Basophils Absolute: 0.4 10*3/uL — ABNORMAL HIGH (ref 0.0–0.1)
Basophils Relative: 2 %
Eosinophils Absolute: 0.4 10*3/uL (ref 0.0–0.5)
Eosinophils Relative: 2 %
HCT: 51.9 % (ref 39.0–52.0)
Hemoglobin: 17.1 g/dL — ABNORMAL HIGH (ref 13.0–17.0)
Immature Granulocytes: 3 %
Lymphocytes Relative: 13 %
Lymphs Abs: 2.5 10*3/uL (ref 0.7–4.0)
MCH: 31.4 pg (ref 26.0–34.0)
MCHC: 32.9 g/dL (ref 30.0–36.0)
MCV: 95.2 fL (ref 80.0–100.0)
Monocytes Absolute: 1.4 10*3/uL — ABNORMAL HIGH (ref 0.1–1.0)
Monocytes Relative: 7 %
Neutro Abs: 14.2 10*3/uL — ABNORMAL HIGH (ref 1.7–7.7)
Neutrophils Relative %: 73 %
Platelet Count: 440 10*3/uL — ABNORMAL HIGH (ref 150–400)
RBC: 5.45 MIL/uL (ref 4.22–5.81)
RDW: 16.4 % — ABNORMAL HIGH (ref 11.5–15.5)
WBC Count: 19.4 10*3/uL — ABNORMAL HIGH (ref 4.0–10.5)
nRBC: 0 % (ref 0.0–0.2)

## 2022-01-04 NOTE — Progress Notes (Signed)
Hematology and Oncology Follow Up Visit ? ?Gregory Blankenship. ?[id number] ?1941-04-30 81 y.o. ?01/04/2022 2:26 PM ? ? 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 ? ? ?Secondary diagnosis:  ? ?Prostate cancer diagnosed in 2011. He was found to have a Gleason score 3+4 = 7 after a prostatectomy completed on 06/20/2010. The final pathology showed a stage T2c disease.  ? ?Prior therapies ? ?Phlebotomy to keep his hematocrit less than 45. ? ?Hydroxyurea 500 mg twice a day started in June 2019.  The dose was reduced to 500 mg daily in August 2019. ? ?Current therapy:   ? ?Hydroxyurea 500 mg daily except for 3 day he takes 1000 mg. ? ?Interim History: Mr. Gregory Blankenship is here for a follow-up visit.  Since last visit, he reports no major changes in his health.  He has tolerated hydroxyurea dosing adjustment without any complaints.  He denies any nausea, vomiting or abdominal pain.  He denies any thrombosis or bleeding episodes.  He denies any skin rash or scalp lesions.  He denies any constitutional symptoms. ? ? ? ? ? ? ?Medications: Reviewed without changes. ?Current Outpatient Medications  ?Medication Sig Dispense Refill  ? acetaminophen (TYLENOL) 325 MG tablet Take 325 mg by mouth every 6 (six) hours as needed for mild pain.     ? aspirin 81 MG tablet Take 81 mg by mouth every evening.     ? carvedilol (COREG) 6.25 MG tablet TAKE 1 TABLET BY MOUTH TWICE DAILY 180 tablet 1  ? hydroxyurea (HYDREA) 500 MG capsule TAKE 1 CAPSULE(500 MG) BY MOUTH TWICE DAILY. MAY TAKE WITH FOOD TO MINIMIZE GI SIDE EFFECTS 270 capsule 3  ? isosorbide mononitrate (IMDUR) 30 MG 24 hr tablet TAKE 1 TABLET(30 MG) BY MOUTH DAILY 90 tablet 2  ? levothyroxine (SYNTHROID, LEVOTHROID) 100 MCG tablet   5  ? nitroGLYCERIN (NITROSTAT) 0.4 MG SL tablet Place 1 tablet (0.4 mg total) under the tongue every 5 (five) minutes as needed for chest pain. 25 tablet 7  ? simvastatin  (ZOCOR) 20 MG tablet Take 20 mg every evening by mouth.     ? warfarin (COUMADIN) 5 MG tablet Take 5 mg See admin instructions by mouth. In the evening on Mon/Wed/Fri    ? ?No current facility-administered medications for this visit.  ? ? ?Allergies:  ?Allergies  ?Allergen Reactions  ? Erythromycin Nausea Only  ? ? ? ? ? ?Physical Exam: ? ? ? ? ? ?Blood pressure 121/73, pulse 60, temperature 97.7 ?F (36.5 ?C), temperature source Temporal, resp. rate 18, height 6' (1.829 m), weight 193 lb 4.8 oz (87.7 kg), SpO2 99 %. ? ? ? ? ? ? ? ? ?ECOG: 1 ? ? ?General appearance: Alert, awake without any distress. ?Head: Atraumatic without abnormalities ?Oropharynx: Without any thrush or ulcers. ?Eyes: No scleral icterus. ?Lymph nodes: No lymphadenopathy noted in the cervical, supraclavicular, or axillary nodes ?Heart:regular rate and rhythm, without any murmurs or gallops.   ?Lung: Clear to auscultation without any rhonchi, wheezes or dullness to percussion. ?Abdomin: Soft, nontender without any shifting dullness or ascites. ?Musculoskeletal: No clubbing or cyanosis. ?Neurological: No motor or sensory deficits. ?Skin: No rashes or lesions. ? ? ? ? ? ? ?. ? ? ? ? ?Lab Results: ?Lab Results  ?Component Value Date  ? WBC 22.5 (H) 10/21/2021  ? HGB 16.3 10/21/2021  ? HCT 50.7 10/21/2021  ? MCV 92.9 10/21/2021  ? PLT 583 (H) 10/21/2021  ? ?  Chemistry   ?   ?Component Value Date/Time  ? NA 136 10/21/2021 1018  ? NA 140 05/09/2017 0844  ? K 4.5 10/21/2021 1018  ? K 4.3 05/09/2017 0844  ? CL 103 10/21/2021 1018  ? CL 102 02/05/2013 1429  ? CO2 28 10/21/2021 1018  ? CO2 25 05/09/2017 0844  ? BUN 18 10/21/2021 1018  ? BUN 16.7 05/09/2017 0844  ? CREATININE 1.27 (H) 10/21/2021 1018  ? CREATININE 1.2 05/09/2017 0844  ?    ?Component Value Date/Time  ? CALCIUM 8.9 10/21/2021 1018  ? CALCIUM 9.4 05/09/2017 0844  ? ALKPHOS 66 10/21/2021 1018  ? ALKPHOS 99 05/09/2017 0844  ? AST 20 10/21/2021 1018  ? AST 49 (H) 05/09/2017 0844  ? ALT 12  10/21/2021 1018  ? ALT 41 05/09/2017 0844  ? BILITOT 0.7 10/21/2021 1018  ? BILITOT 0.55 05/09/2017 0844  ?  ? ? ? ? ? ?Impression and Plan: ? ?81 year old man with: ? ?1.  Polycythemia vera diagnosed in 2013.  He was found to have JAK2 positive myeloproliferative disorder. ? ?Laboratory data from today reviewed and continues to tolerate hydroxyurea.  Laboratory data appears adequately controlled at this time with hemoglobin currently around 17 and does not require any phlebotomy. ? ?2. Thrombosis prophylaxis: No bleeding or thrombosis noted at this time.  He is on full dose anticoagulation. ? ?3. Leukocytosis and thrombocytosis: Improved with the adjusting of hydroxyurea dosing. ? ?4. Follow-up: In 3 months for repeat follow-up. ? ?30  minutes were spent on this encounter.  The time was dedicated to reviewing laboratory data, disease status update and future plan of care. ? ? ?Zola Button, MD ?3/29/20232:26 PM  ? ? ?

## 2022-01-06 DIAGNOSIS — Z7901 Long term (current) use of anticoagulants: Secondary | ICD-10-CM | POA: Diagnosis not present

## 2022-02-20 DIAGNOSIS — J069 Acute upper respiratory infection, unspecified: Secondary | ICD-10-CM | POA: Diagnosis not present

## 2022-02-20 DIAGNOSIS — Z03818 Encounter for observation for suspected exposure to other biological agents ruled out: Secondary | ICD-10-CM | POA: Diagnosis not present

## 2022-02-20 DIAGNOSIS — Z7901 Long term (current) use of anticoagulants: Secondary | ICD-10-CM | POA: Diagnosis not present

## 2022-02-20 DIAGNOSIS — R059 Cough, unspecified: Secondary | ICD-10-CM | POA: Diagnosis not present

## 2022-02-20 DIAGNOSIS — R42 Dizziness and giddiness: Secondary | ICD-10-CM | POA: Diagnosis not present

## 2022-03-16 DIAGNOSIS — Z7901 Long term (current) use of anticoagulants: Secondary | ICD-10-CM | POA: Diagnosis not present

## 2022-04-06 ENCOUNTER — Inpatient Hospital Stay: Payer: Medicare Other | Attending: Oncology

## 2022-04-06 ENCOUNTER — Inpatient Hospital Stay: Payer: Medicare Other | Admitting: Oncology

## 2022-04-06 VITALS — BP 121/71 | HR 59 | Temp 97.5°F | Wt 190.2 lb

## 2022-04-06 DIAGNOSIS — Z8546 Personal history of malignant neoplasm of prostate: Secondary | ICD-10-CM | POA: Insufficient documentation

## 2022-04-06 DIAGNOSIS — D45 Polycythemia vera: Secondary | ICD-10-CM | POA: Insufficient documentation

## 2022-04-06 DIAGNOSIS — Z79899 Other long term (current) drug therapy: Secondary | ICD-10-CM | POA: Diagnosis not present

## 2022-04-06 DIAGNOSIS — D471 Chronic myeloproliferative disease: Secondary | ICD-10-CM | POA: Diagnosis not present

## 2022-04-06 LAB — CBC WITH DIFFERENTIAL (CANCER CENTER ONLY)
Abs Immature Granulocytes: 0.36 10*3/uL — ABNORMAL HIGH (ref 0.00–0.07)
Basophils Absolute: 0.2 10*3/uL — ABNORMAL HIGH (ref 0.0–0.1)
Basophils Relative: 1 %
Eosinophils Absolute: 0.3 10*3/uL (ref 0.0–0.5)
Eosinophils Relative: 2 %
HCT: 43.4 % (ref 39.0–52.0)
Hemoglobin: 14.6 g/dL (ref 13.0–17.0)
Immature Granulocytes: 2 %
Lymphocytes Relative: 14 %
Lymphs Abs: 2.4 10*3/uL (ref 0.7–4.0)
MCH: 33.1 pg (ref 26.0–34.0)
MCHC: 33.6 g/dL (ref 30.0–36.0)
MCV: 98.4 fL (ref 80.0–100.0)
Monocytes Absolute: 1.7 10*3/uL — ABNORMAL HIGH (ref 0.1–1.0)
Monocytes Relative: 10 %
Neutro Abs: 12.2 10*3/uL — ABNORMAL HIGH (ref 1.7–7.7)
Neutrophils Relative %: 71 %
Platelet Count: 557 10*3/uL — ABNORMAL HIGH (ref 150–400)
RBC: 4.41 MIL/uL (ref 4.22–5.81)
RDW: 16.3 % — ABNORMAL HIGH (ref 11.5–15.5)
WBC Count: 17.2 10*3/uL — ABNORMAL HIGH (ref 4.0–10.5)
nRBC: 0 % (ref 0.0–0.2)

## 2022-04-06 LAB — CMP (CANCER CENTER ONLY)
ALT: 16 U/L (ref 0–44)
AST: 21 U/L (ref 15–41)
Albumin: 4.4 g/dL (ref 3.5–5.0)
Alkaline Phosphatase: 68 U/L (ref 38–126)
Anion gap: 6 (ref 5–15)
BUN: 23 mg/dL (ref 8–23)
CO2: 26 mmol/L (ref 22–32)
Calcium: 9.3 mg/dL (ref 8.9–10.3)
Chloride: 99 mmol/L (ref 98–111)
Creatinine: 1.32 mg/dL — ABNORMAL HIGH (ref 0.61–1.24)
GFR, Estimated: 55 mL/min — ABNORMAL LOW (ref >60)
Glucose, Bld: 100 mg/dL — ABNORMAL HIGH (ref 70–99)
Potassium: 4.6 mmol/L (ref 3.5–5.1)
Sodium: 131 mmol/L — ABNORMAL LOW (ref 135–145)
Total Bilirubin: 0.6 mg/dL (ref 0.3–1.2)
Total Protein: 6.7 g/dL (ref 6.5–8.1)

## 2022-04-06 NOTE — Progress Notes (Signed)
Hematology and Oncology Follow Up Visit  Gregory Blankenship [id number] Jul 03, 1941 81 y.o. 04/06/2022 2:59 PM   Principle Diagnosis: 81 year old man with JAK2 positive polycythemia vera diagnosed in 2013.  He presented with erythrocytosis as a part of his myeloproliferative disorder.  Secondary diagnosis:   Prostate cancer diagnosed in 2011. He was found to have a Gleason score 3+4 = 7 after a prostatectomy completed on 06/20/2010. The final pathology showed a stage T2c disease.   Prior therapies  Phlebotomy to keep his hematocrit less than 45.  Hydroxyurea 500 mg twice a day started in June 2019.  The dose was reduced to 500 mg daily in August 2019.  Current therapy:    Hydroxyurea 500 mg daily except for 3 day he takes 1000 mg.  Interim History: Mr. Gregory Blankenship returns today for repeat evaluation.  Since her last visit, he reports feeling well without any major complaints.  He has tolerated hydroxyurea at the current dose without any issues.  He denies nausea, vomiting or abdominal pain.  He denies any worsening neuropathy or constitutional symptoms.  He denies any bleeding or thrombosis episodes.  He denies any headaches or chest pain.      Medications: Updated on review. Current Outpatient Medications  Medication Sig Dispense Refill   acetaminophen (TYLENOL) 325 MG tablet Take 325 mg by mouth every 6 (six) hours as needed for mild pain.      aspirin 81 MG tablet Take 81 mg by mouth every evening.      carvedilol (COREG) 6.25 MG tablet TAKE 1 TABLET BY MOUTH TWICE DAILY 180 tablet 1   hydroxyurea (HYDREA) 500 MG capsule TAKE 1 CAPSULE(500 MG) BY MOUTH TWICE DAILY. MAY TAKE WITH FOOD TO MINIMIZE GI SIDE EFFECTS 270 capsule 3   isosorbide mononitrate (IMDUR) 30 MG 24 hr tablet TAKE 1 TABLET(30 MG) BY MOUTH DAILY 90 tablet 2   levothyroxine (SYNTHROID, LEVOTHROID) 100 MCG tablet   5   nitroGLYCERIN (NITROSTAT) 0.4 MG SL tablet Place 1 tablet (0.4 mg total) under the tongue every 5 (five)  minutes as needed for chest pain. 25 tablet 7   simvastatin (ZOCOR) 20 MG tablet Take 20 mg every evening by mouth.      warfarin (COUMADIN) 5 MG tablet Take 5 mg See admin instructions by mouth. In the evening on Mon/Wed/Fri     No current facility-administered medications for this visit.    Allergies:  Allergies  Allergen Reactions   Erythromycin Nausea Only       Physical Exam: Blood pressure 121/71, pulse (!) 59, temperature (!) 97.5 F (36.4 C), temperature source Oral, weight 190 lb 3.2 oz (86.3 kg), SpO2 99 %.    ECOG: 1   General appearance: Comfortable appearing without any discomfort Head: Normocephalic without any trauma Oropharynx: Mucous membranes are moist and pink without any thrush or ulcers. Eyes: Pupils are equal and round reactive to light. Lymph nodes: No cervical, supraclavicular, inguinal or axillary lymphadenopathy.   Heart:regular rate and rhythm.  S1 and S2 without leg edema. Lung: Clear without any rhonchi or wheezes.  No dullness to percussion. Abdomin: Soft, nontender, nondistended with good bowel sounds.  No hepatosplenomegaly. Musculoskeletal: No joint deformity or effusion.  Full range of motion noted. Neurological: No deficits noted on motor, sensory and deep tendon reflex exam. Skin: No petechial rash or dryness.  Appeared moist.         .     Lab Results: Lab Results  Component Value Date  WBC 19.4 (H) 01/04/2022   HGB 17.1 (H) 01/04/2022   HCT 51.9 01/04/2022   MCV 95.2 01/04/2022   PLT 440 (H) 01/04/2022     Chemistry      Component Value Date/Time   NA 136 01/04/2022 1434   NA 140 05/09/2017 0844   K 4.4 01/04/2022 1434   K 4.3 05/09/2017 0844   CL 101 01/04/2022 1434   CL 102 02/05/2013 1429   CO2 27 01/04/2022 1434   CO2 25 05/09/2017 0844   BUN 26 (H) 01/04/2022 1434   BUN 16.7 05/09/2017 0844   CREATININE 1.18 01/04/2022 1434   CREATININE 1.2 05/09/2017 0844      Component Value Date/Time   CALCIUM 9.1  01/04/2022 1434   CALCIUM 9.4 05/09/2017 0844   ALKPHOS 64 01/04/2022 1434   ALKPHOS 99 05/09/2017 0844   AST 27 01/04/2022 1434   AST 49 (H) 05/09/2017 0844   ALT 18 01/04/2022 1434   ALT 41 05/09/2017 0844   BILITOT 0.5 01/04/2022 1434   BILITOT 0.55 05/09/2017 0844         Impression and Plan:  81 year old man with:  1.  JAK2 positive polycythemia vera diagnosed in 2013.    He continues to be on hydroxyurea with excellent tolerance and overall manageable counts.  Risks and benefits of continuing this treatment were reviewed at this time.  Complications that include bone marrow disease, GI toxicity and dermatological issues were reviewed.  He is agreeable to continue at this time.  Laboratory data from today showed adequate control of his counts without any need for additional phlebotomy.  2. Thrombosis prophylaxis: His risk of thrombosis is low and he remains on chronic anticoagulation.  3. Leukocytosis and thrombocytosis: His counts are manageable with hydroxyurea.  No need for adjusting his hydroxyurea dosing.  4. Follow-up: In 3 months for repeat follow-up.  30  minutes were dedicated to this visit.  The time was spent on reviewing laboratory data, disease status update and outlining future plan of care discussion.   Zola Button, MD 6/29/20232:59 PM

## 2022-04-17 DIAGNOSIS — Z7901 Long term (current) use of anticoagulants: Secondary | ICD-10-CM | POA: Diagnosis not present

## 2022-04-20 DIAGNOSIS — L570 Actinic keratosis: Secondary | ICD-10-CM | POA: Diagnosis not present

## 2022-04-20 DIAGNOSIS — D485 Neoplasm of uncertain behavior of skin: Secondary | ICD-10-CM | POA: Diagnosis not present

## 2022-04-20 DIAGNOSIS — D0421 Carcinoma in situ of skin of right ear and external auricular canal: Secondary | ICD-10-CM | POA: Diagnosis not present

## 2022-04-20 DIAGNOSIS — Z85820 Personal history of malignant melanoma of skin: Secondary | ICD-10-CM | POA: Diagnosis not present

## 2022-04-20 DIAGNOSIS — Z85828 Personal history of other malignant neoplasm of skin: Secondary | ICD-10-CM | POA: Diagnosis not present

## 2022-04-21 ENCOUNTER — Telehealth: Payer: Self-pay | Admitting: Oncology

## 2022-04-21 NOTE — Telephone Encounter (Signed)
Called patient regarding upcoming September appointments, patient has been called and notified. 

## 2022-05-15 DIAGNOSIS — Z7901 Long term (current) use of anticoagulants: Secondary | ICD-10-CM | POA: Diagnosis not present

## 2022-06-09 DIAGNOSIS — Z7901 Long term (current) use of anticoagulants: Secondary | ICD-10-CM | POA: Diagnosis not present

## 2022-06-19 ENCOUNTER — Other Ambulatory Visit: Payer: Self-pay | Admitting: Oncology

## 2022-06-19 DIAGNOSIS — D45 Polycythemia vera: Secondary | ICD-10-CM

## 2022-06-29 ENCOUNTER — Other Ambulatory Visit: Payer: Self-pay | Admitting: Cardiovascular Disease

## 2022-07-06 ENCOUNTER — Inpatient Hospital Stay: Payer: Medicare Other | Attending: Oncology

## 2022-07-06 ENCOUNTER — Inpatient Hospital Stay: Payer: Medicare Other | Admitting: Oncology

## 2022-07-06 ENCOUNTER — Telehealth: Payer: Self-pay | Admitting: Oncology

## 2022-07-06 ENCOUNTER — Other Ambulatory Visit: Payer: Self-pay

## 2022-07-06 VITALS — BP 98/62 | HR 59 | Temp 98.1°F | Resp 18 | Ht 72.0 in | Wt 185.7 lb

## 2022-07-06 DIAGNOSIS — Z79899 Other long term (current) drug therapy: Secondary | ICD-10-CM | POA: Diagnosis not present

## 2022-07-06 DIAGNOSIS — D45 Polycythemia vera: Secondary | ICD-10-CM | POA: Insufficient documentation

## 2022-07-06 DIAGNOSIS — D75839 Thrombocytosis, unspecified: Secondary | ICD-10-CM | POA: Insufficient documentation

## 2022-07-06 DIAGNOSIS — D72829 Elevated white blood cell count, unspecified: Secondary | ICD-10-CM | POA: Diagnosis not present

## 2022-07-06 LAB — CBC WITH DIFFERENTIAL (CANCER CENTER ONLY)
Abs Immature Granulocytes: 0.55 10*3/uL — ABNORMAL HIGH (ref 0.00–0.07)
Basophils Absolute: 0.2 10*3/uL — ABNORMAL HIGH (ref 0.0–0.1)
Basophils Relative: 1 %
Eosinophils Absolute: 0.3 10*3/uL (ref 0.0–0.5)
Eosinophils Relative: 2 %
HCT: 35 % — ABNORMAL LOW (ref 39.0–52.0)
Hemoglobin: 12 g/dL — ABNORMAL LOW (ref 13.0–17.0)
Immature Granulocytes: 4 %
Lymphocytes Relative: 11 %
Lymphs Abs: 1.7 10*3/uL (ref 0.7–4.0)
MCH: 36.9 pg — ABNORMAL HIGH (ref 26.0–34.0)
MCHC: 34.3 g/dL (ref 30.0–36.0)
MCV: 107.7 fL — ABNORMAL HIGH (ref 80.0–100.0)
Monocytes Absolute: 1.4 10*3/uL — ABNORMAL HIGH (ref 0.1–1.0)
Monocytes Relative: 9 %
Neutro Abs: 11.5 10*3/uL — ABNORMAL HIGH (ref 1.7–7.7)
Neutrophils Relative %: 73 %
Platelet Count: 566 10*3/uL — ABNORMAL HIGH (ref 150–400)
RBC: 3.25 MIL/uL — ABNORMAL LOW (ref 4.22–5.81)
RDW: 15.9 % — ABNORMAL HIGH (ref 11.5–15.5)
WBC Count: 15.7 10*3/uL — ABNORMAL HIGH (ref 4.0–10.5)
nRBC: 0 % (ref 0.0–0.2)

## 2022-07-06 LAB — CMP (CANCER CENTER ONLY)
ALT: 25 U/L (ref 0–44)
AST: 29 U/L (ref 15–41)
Albumin: 4.3 g/dL (ref 3.5–5.0)
Alkaline Phosphatase: 71 U/L (ref 38–126)
Anion gap: 6 (ref 5–15)
BUN: 22 mg/dL (ref 8–23)
CO2: 29 mmol/L (ref 22–32)
Calcium: 9 mg/dL (ref 8.9–10.3)
Chloride: 101 mmol/L (ref 98–111)
Creatinine: 1.28 mg/dL — ABNORMAL HIGH (ref 0.61–1.24)
GFR, Estimated: 57 mL/min — ABNORMAL LOW (ref >60)
Glucose, Bld: 81 mg/dL (ref 70–99)
Potassium: 4.7 mmol/L (ref 3.5–5.1)
Sodium: 136 mmol/L (ref 135–145)
Total Bilirubin: 0.6 mg/dL (ref 0.3–1.2)
Total Protein: 6.8 g/dL (ref 6.5–8.1)

## 2022-07-06 NOTE — Telephone Encounter (Signed)
Spoke with patient wife to confirm 12/28 appointment

## 2022-07-06 NOTE — Progress Notes (Signed)
Hematology and Oncology Follow Up Visit  Gregory Blankenship [id number] 1941-07-16 81 y.o. 07/06/2022 9:15 AM   Principle Diagnosis: 81 year old man with polycythemia vera diagnosed in 2013.  He was found to have JAK2 positive disease.  Secondary diagnosis:   Prostate cancer diagnosed in 2011. He was found to have a Gleason score 3+4 = 7 after a prostatectomy completed on 06/20/2010. The final pathology showed a stage T2c disease.   Prior therapies  Phlebotomy to keep his hematocrit less than 45.  Hydroxyurea 500 mg twice a day started in June 2019.    Current therapy:    Hydroxyurea 500 mg daily except for 3 day he takes 1000 mg.  This will be changed to 1000 mg 2 days a week on July 06, 2022.  Interim History: Gregory Blankenship is here for a follow-up.  Since the last visit, he reports feeling well without any major complaints.  He denies any nausea, fatigue or any complications related to hydroxyurea.  He denies any hospitalizations or illnesses.  He denies any bleeding or thrombosis episodes.      Medications: Reviewed without changes. Current Outpatient Medications  Medication Sig Dispense Refill   acetaminophen (TYLENOL) 325 MG tablet Take 325 mg by mouth every 6 (six) hours as needed for mild pain.      aspirin 81 MG tablet Take 81 mg by mouth every evening.      carvedilol (COREG) 6.25 MG tablet TAKE 1 TABLET BY MOUTH TWICE DAILY 180 tablet 1   hydroxyurea (HYDREA) 500 MG capsule TAKE ONE CAPSULE BY MOUTH TWICE DAILY. MAY TAKE WITH FOOD TO MINIMIZE GI SIDE EFFECTS 270 capsule 3   isosorbide mononitrate (IMDUR) 30 MG 24 hr tablet TAKE 1 TABLET(30 MG) BY MOUTH DAILY 90 tablet 0   levothyroxine (SYNTHROID, LEVOTHROID) 100 MCG tablet   5   nitroGLYCERIN (NITROSTAT) 0.4 MG SL tablet Place 1 tablet (0.4 mg total) under the tongue every 5 (five) minutes as needed for chest pain. 25 tablet 7   simvastatin (ZOCOR) 20 MG tablet Take 20 mg every evening by mouth.      warfarin  (COUMADIN) 5 MG tablet Take 5 mg See admin instructions by mouth. In the evening on Mon/Wed/Fri     No current facility-administered medications for this visit.    Allergies:  Allergies  Allergen Reactions   Erythromycin Nausea Only       Physical Exam:  Blood pressure 98/62, pulse (!) 59, temperature 98.1 F (36.7 C), temperature source Tympanic, resp. rate 18, height 6' (1.829 m), weight 185 lb 11.2 oz (84.2 kg), SpO2 99 %.    ECOG: 1    General appearance: Alert, awake without any distress. Head: Atraumatic without abnormalities Oropharynx: Without any thrush or ulcers. Eyes: No scleral icterus. Lymph nodes: No lymphadenopathy noted in the cervical, supraclavicular, or axillary nodes Heart:regular rate and rhythm, without any murmurs or gallops.   Lung: Clear to auscultation without any rhonchi, wheezes or dullness to percussion. Abdomin: Soft, nontender without any shifting dullness or ascites. Musculoskeletal: No clubbing or cyanosis. Neurological: No motor or sensory deficits. Skin: No rashes or lesions. Psychiatric: Mood and affect appeared normal.        .     Lab Results: Lab Results  Component Value Date   WBC 17.2 (H) 04/06/2022   HGB 14.6 04/06/2022   HCT 43.4 04/06/2022   MCV 98.4 04/06/2022   PLT 557 (H) 04/06/2022     Chemistry  Component Value Date/Time   NA 131 (L) 04/06/2022 1452   NA 140 05/09/2017 0844   K 4.6 04/06/2022 1452   K 4.3 05/09/2017 0844   CL 99 04/06/2022 1452   CL 102 02/05/2013 1429   CO2 26 04/06/2022 1452   CO2 25 05/09/2017 0844   BUN 23 04/06/2022 1452   BUN 16.7 05/09/2017 0844   CREATININE 1.32 (H) 04/06/2022 1452   CREATININE 1.2 05/09/2017 0844      Component Value Date/Time   CALCIUM 9.3 04/06/2022 1452   CALCIUM 9.4 05/09/2017 0844   ALKPHOS 68 04/06/2022 1452   ALKPHOS 99 05/09/2017 0844   AST 21 04/06/2022 1452   AST 49 (H) 05/09/2017 0844   ALT 16 04/06/2022 1452   ALT 41 05/09/2017  0844   BILITOT 0.6 04/06/2022 1452   BILITOT 0.55 05/09/2017 0844         Impression and Plan:  81 year old man with:  1.  Polycythemia Diagnosed in 2013. He was found to have JAK2 positive mutation.  The natural course of this disease and treatment options were discussed.  He continues to be on hydroxyurea and has tolerated the current dose without any issues.  His hemoglobin remains under excellent control without any complications.  Long-term issues associated with hydroxyurea were reiterated these include arthralgias, dermatological toxicity, fatigue as well as potential long-term bone marrow issues.  After discussion today, I recommended decreasing the hydroxyurea dose to 1000 mg twice a week and he will keep 500 mg daily the remaining of the week.  His hemoglobin has drifted down to 12 and likely will continue to drift without dose adjustment.  2. Thrombosis prophylaxis: He is on chronic anticoagulation for cardiac considerations with warfarin with a low risk of thrombosis.  3. Leukocytosis and thrombocytosis: His counts are under control with hydroxyurea without any need for dose escalation.  4. Follow-up: In 3 months for a follow-up.   30  minutes were spent on this encounter.  The time was dedicated to reviewing laboratory data, disease status update and outlining future plan of care discussion.   Zola Button, MD 9/28/20239:15 AM

## 2022-07-07 DIAGNOSIS — Z0000 Encounter for general adult medical examination without abnormal findings: Secondary | ICD-10-CM | POA: Diagnosis not present

## 2022-07-07 DIAGNOSIS — I10 Essential (primary) hypertension: Secondary | ICD-10-CM | POA: Diagnosis not present

## 2022-07-07 DIAGNOSIS — Z86711 Personal history of pulmonary embolism: Secondary | ICD-10-CM | POA: Diagnosis not present

## 2022-07-07 DIAGNOSIS — N1831 Chronic kidney disease, stage 3a: Secondary | ICD-10-CM | POA: Diagnosis not present

## 2022-07-07 DIAGNOSIS — Z125 Encounter for screening for malignant neoplasm of prostate: Secondary | ICD-10-CM | POA: Diagnosis not present

## 2022-07-07 DIAGNOSIS — Z8546 Personal history of malignant neoplasm of prostate: Secondary | ICD-10-CM | POA: Diagnosis not present

## 2022-07-07 DIAGNOSIS — I2510 Atherosclerotic heart disease of native coronary artery without angina pectoris: Secondary | ICD-10-CM | POA: Diagnosis not present

## 2022-07-07 DIAGNOSIS — Z7901 Long term (current) use of anticoagulants: Secondary | ICD-10-CM | POA: Diagnosis not present

## 2022-08-03 DIAGNOSIS — Z7901 Long term (current) use of anticoagulants: Secondary | ICD-10-CM | POA: Diagnosis not present

## 2022-08-16 ENCOUNTER — Ambulatory Visit: Payer: Medicare Other | Attending: Cardiovascular Disease | Admitting: Cardiovascular Disease

## 2022-08-16 ENCOUNTER — Encounter: Payer: Self-pay | Admitting: Cardiovascular Disease

## 2022-08-16 VITALS — BP 93/57 | HR 56 | Ht 73.0 in | Wt 190.1 lb

## 2022-08-16 DIAGNOSIS — I255 Ischemic cardiomyopathy: Secondary | ICD-10-CM

## 2022-08-16 DIAGNOSIS — I2102 ST elevation (STEMI) myocardial infarction involving left anterior descending coronary artery: Secondary | ICD-10-CM

## 2022-08-16 DIAGNOSIS — I2510 Atherosclerotic heart disease of native coronary artery without angina pectoris: Secondary | ICD-10-CM | POA: Diagnosis not present

## 2022-08-16 DIAGNOSIS — I10 Essential (primary) hypertension: Secondary | ICD-10-CM

## 2022-08-16 DIAGNOSIS — E782 Mixed hyperlipidemia: Secondary | ICD-10-CM

## 2022-08-16 NOTE — Assessment & Plan Note (Signed)
History of ischemic cardiomyopathy with an EF initially at the time of his infarct of 30%.  Most recent 2D echo performed 09/20/2017 revealed ejection fraction of 40 to 45% with anteroapical wall motion abnormalities.  He is completely asymptomatic.  He is on carvedilol however his blood pressure is too low to accommodate other medicines such as ACE and/or ARB.

## 2022-08-16 NOTE — Assessment & Plan Note (Signed)
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  He was not Coumadin oral anticoagulation because of pulmonary embolus at that time.  I cathed him radially revealing a left dominant system with an occluded mid LAD.  I tried to recanalize him however this was unsuccessful probably because it was a CTO.  He did have an anterior wall motion abnormality with an EF of 30% and anteroapical dyskinesia.  This ultimately improved over time.  He has had no recurrent symptoms.

## 2022-08-16 NOTE — Patient Instructions (Signed)
Medication Instructions:  NO CHANGES  *If you need a refill on your cardiac medications before your next appointment, please call your pharmacy*   Follow-Up: At [facility], you and your health needs are our priority.  As part of our continuing mission to provide you with exceptional heart care, we have created designated Provider Care Teams.  These Care Teams include your primary Cardiologist (physician) and Advanced Practice Providers (APPs -  Physician Assistants and Nurse Practitioners) who all work together to provide you with the care you need, when you need it.  We recommend signing up for the patient portal called "MyChart".  Sign up information is provided on this After Visit Summary.  MyChart is used to connect with patients for Virtual Visits (Telemedicine).  Patients are able to view lab/test results, encounter notes, upcoming appointments, etc.  Non-urgent messages can be sent to your provider as well.   To learn more about what you can do with MyChart, go to [email or web address].    Your next appointment:   12 month(s)  The format for your next appointment:   In Person  Provider:   Quay Burow, MD

## 2022-08-16 NOTE — Assessment & Plan Note (Signed)
History of hyperlipidemia on statin therapy with lipid profile performed 07/07/2022 revealing a total cholesterol of 108, LDL 59 and HDL 31.

## 2022-08-16 NOTE — Assessment & Plan Note (Signed)
History of essential hypertension a blood pressure measured today at 93/57.  He is on low-dose carvedilol.

## 2022-08-16 NOTE — Progress Notes (Signed)
08/16/2022 Gregory Blankenship   1941-08-17  [id number]  Primary Physician Seward Carol, MD Primary Cardiologist: Lorretta Harp MD FACP, [place], [place], [place]  HPI:  Gregory Blankenship. is a 81 y.o.  thin and fit-appearing, married Caucasian male, father of 2, grandfather to 5 grandchildren who I last saw in the office 08/12/21. He has a history of an anterior ST-segment-elevation myocardial infarction December 02, 2011. He was on Coumadin anticoagulation because of prior pulmonary embolus. I cath'd him radially revealing a left dominant system with an occluded mid LAD. I tried to percutaneously recanalize him. However, this was unsuccessful probably because this was a "CTO." He did have anterior wall motion abnormalities and EF of 30% and anteroapical dyskinesia which ultimately improved over time to an EF of 50% by 2D echo in June of 2014. He is completely asymptomatic. He participated in cardiac rehab.   He has a daughter who lives in [place] who he visits several times a year.    Since I saw him in the office a year ago he continues to do well.  He is fairly active and denies chest pain or shortness of breath.     Current Meds  Medication Sig   acetaminophen (TYLENOL) 325 MG tablet Take 325 mg by mouth every 6 (six) hours as needed for mild pain.    aspirin 81 MG tablet Take 81 mg by mouth every evening.    carvedilol (COREG) 6.25 MG tablet TAKE 1 TABLET BY MOUTH TWICE DAILY   hydroxyurea (HYDREA) 500 MG capsule TAKE ONE CAPSULE BY MOUTH TWICE DAILY. MAY TAKE WITH FOOD TO MINIMIZE GI SIDE EFFECTS   isosorbide mononitrate (IMDUR) 30 MG 24 hr tablet TAKE 1 TABLET(30 MG) BY MOUTH DAILY   levothyroxine (SYNTHROID, LEVOTHROID) 100 MCG tablet    nitroGLYCERIN (NITROSTAT) 0.4 MG SL tablet Place 1 tablet (0.4 mg total) under the tongue every 5 (five) minutes as needed for chest pain.   simvastatin (ZOCOR) 20 MG tablet Take 20 mg every evening by mouth.    warfarin (COUMADIN) 5 MG tablet  Take 5 mg See admin instructions by mouth. In the evening on Mon/Wed/Fri     Allergies  Allergen Reactions   Erythromycin Nausea Only    Social History   Socioeconomic History   Marital status: Married    Spouse name: Gregory Blankenship   Number of children: 2   Years of education: college   Highest education level: Not on file  Occupational History   Not on file  Tobacco Use   Smoking status: Former    Types: Cigarettes    Quit date: 12/01/1968    Years since quitting: 53.7   Smokeless tobacco: Never  Vaping Use   Vaping Use: Never used  Substance and Sexual Activity   Alcohol use: No    Comment: hx of 5-6 years ago an occasional beer or wine   Drug use: No   Sexual activity: Not on file  Other Topics Concern   Not on file  Social History Narrative   Lives with wife, Gregory Blankenship   Caffeine use: very little   Right handed    Social Determinants of Health   Financial Resource Strain: Not on file  Food Insecurity: Not on file  Transportation Needs: Not on file  Physical Activity: Not on file  Stress: Not on file  Social Connections: Not on file  Intimate Partner Violence: Not on file     Review of Systems: General: negative  for chills, fever, night sweats or weight changes.  Cardiovascular: negative for chest pain, dyspnea on exertion, edema, orthopnea, palpitations, paroxysmal nocturnal dyspnea or shortness of breath Dermatological: negative for rash Respiratory: negative for cough or wheezing Urologic: negative for hematuria Abdominal: negative for nausea, vomiting, diarrhea, bright red blood per rectum, melena, or hematemesis Neurologic: negative for visual changes, syncope, or dizziness All other systems reviewed and are otherwise negative except as noted above.    Blood pressure (!) 93/57, pulse (!) 56, height '6\' 1"'$  (1.854 m), weight 190 lb 1.6 oz (86.2 kg), SpO2 98 %.  General appearance: alert and no distress Neck: no adenopathy, no carotid bruit, no JVD, supple,  symmetrical, trachea midline, and thyroid not enlarged, symmetric, no tenderness/mass/nodules Lungs: clear to auscultation bilaterally Heart: regular rate and rhythm, S1, S2 normal, no murmur, click, rub or gallop Extremities: extremities normal, atraumatic, no cyanosis or edema Pulses: 2+ and symmetric Skin: Skin color, texture, turgor normal. No rashes or lesions Neurologic: Grossly normal  EKG sinus bradycardia 56 with septal Q waves and low limb voltage.  I personally reviewed this EKG.  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  He was not Coumadin oral anticoagulation because of pulmonary embolus at that time.  I cathed him radially revealing a left dominant system with an occluded mid LAD.  I tried to recanalize him however this was unsuccessful probably because it was a CTO.  He did have an anterior wall motion abnormality with an EF of 30% and anteroapical dyskinesia.  This ultimately improved over time.  He has had no recurrent symptoms.  Cardiomyopathy, ischemic, EF 35-40% by 2D 2/26 History of ischemic cardiomyopathy with an EF initially at the time of his infarct of 30%.  Most recent 2D echo performed 09/20/2017 revealed ejection fraction of 40 to 45% with anteroapical wall motion abnormalities.  He is completely asymptomatic.  He is on carvedilol however his blood pressure is too low to accommodate other medicines such as ACE and/or ARB.  Essential hypertension History of essential hypertension a blood pressure measured today at 93/57.  He is on low-dose carvedilol.  Hyperlipidemia History of hyperlipidemia on statin therapy with lipid profile performed 07/07/2022 revealing a total cholesterol of 108, LDL 59 and HDL 31.     Lorretta Harp MD [facility], [facility] 08/16/2022 2:46 PM

## 2022-08-22 DIAGNOSIS — Z85828 Personal history of other malignant neoplasm of skin: Secondary | ICD-10-CM | POA: Diagnosis not present

## 2022-08-22 DIAGNOSIS — Z85820 Personal history of malignant melanoma of skin: Secondary | ICD-10-CM | POA: Diagnosis not present

## 2022-08-22 DIAGNOSIS — L859 Epidermal thickening, unspecified: Secondary | ICD-10-CM | POA: Diagnosis not present

## 2022-08-22 DIAGNOSIS — D485 Neoplasm of uncertain behavior of skin: Secondary | ICD-10-CM | POA: Diagnosis not present

## 2022-08-22 DIAGNOSIS — L570 Actinic keratosis: Secondary | ICD-10-CM | POA: Diagnosis not present

## 2022-09-07 ENCOUNTER — Other Ambulatory Visit: Payer: Self-pay

## 2022-09-07 MED ORDER — CARVEDILOL 6.25 MG PO TABS: 6.2500 mg | ORAL_TABLET | Freq: Two times a day (BID) | ORAL | 3 refills | Status: DC

## 2022-09-12 DIAGNOSIS — Z7901 Long term (current) use of anticoagulants: Secondary | ICD-10-CM | POA: Diagnosis not present

## 2022-09-14 DIAGNOSIS — W19XXXA Unspecified fall, initial encounter: Secondary | ICD-10-CM | POA: Diagnosis not present

## 2022-09-14 DIAGNOSIS — I951 Orthostatic hypotension: Secondary | ICD-10-CM | POA: Diagnosis not present

## 2022-09-24 ENCOUNTER — Other Ambulatory Visit: Payer: Self-pay | Admitting: Cardiovascular Disease

## 2022-10-05 ENCOUNTER — Inpatient Hospital Stay: Payer: Medicare Other | Attending: Oncology

## 2022-10-05 ENCOUNTER — Other Ambulatory Visit: Payer: Self-pay

## 2022-10-05 ENCOUNTER — Inpatient Hospital Stay: Payer: Medicare Other | Admitting: Oncology

## 2022-10-05 VITALS — BP 112/60 | HR 61 | Temp 97.2°F | Resp 17 | Ht 73.0 in | Wt 190.1 lb

## 2022-10-05 DIAGNOSIS — Z79899 Other long term (current) drug therapy: Secondary | ICD-10-CM | POA: Insufficient documentation

## 2022-10-05 DIAGNOSIS — Z8546 Personal history of malignant neoplasm of prostate: Secondary | ICD-10-CM | POA: Insufficient documentation

## 2022-10-05 DIAGNOSIS — D45 Polycythemia vera: Secondary | ICD-10-CM | POA: Diagnosis not present

## 2022-10-05 DIAGNOSIS — M109 Gout, unspecified: Secondary | ICD-10-CM | POA: Insufficient documentation

## 2022-10-05 DIAGNOSIS — Z7984 Long term (current) use of oral hypoglycemic drugs: Secondary | ICD-10-CM | POA: Diagnosis not present

## 2022-10-05 DIAGNOSIS — Z9079 Acquired absence of other genital organ(s): Secondary | ICD-10-CM | POA: Insufficient documentation

## 2022-10-05 LAB — CBC WITH DIFFERENTIAL (CANCER CENTER ONLY)
Abs Immature Granulocytes: 0.6 10*3/uL — ABNORMAL HIGH (ref 0.00–0.07)
Band Neutrophils: 1 %
Basophils Absolute: 0 10*3/uL (ref 0.0–0.1)
Basophils Relative: 0 %
Eosinophils Absolute: 0.3 10*3/uL (ref 0.0–0.5)
Eosinophils Relative: 1 %
HCT: 41.8 % (ref 39.0–52.0)
Hemoglobin: 14.3 g/dL (ref 13.0–17.0)
Lymphocytes Relative: 6 %
Lymphs Abs: 1.8 10*3/uL (ref 0.7–4.0)
MCH: 37.3 pg — ABNORMAL HIGH (ref 26.0–34.0)
MCHC: 34.2 g/dL (ref 30.0–36.0)
MCV: 109.1 fL — ABNORMAL HIGH (ref 80.0–100.0)
Metamyelocytes Relative: 1 %
Monocytes Absolute: 1.5 10*3/uL — ABNORMAL HIGH (ref 0.1–1.0)
Monocytes Relative: 5 %
Myelocytes: 1 %
Neutro Abs: 25.8 10*3/uL — ABNORMAL HIGH (ref 1.7–7.7)
Neutrophils Relative %: 85 %
Platelet Count: 756 10*3/uL — ABNORMAL HIGH (ref 150–400)
RBC: 3.83 MIL/uL — ABNORMAL LOW (ref 4.22–5.81)
RDW: 12.9 % (ref 11.5–15.5)
WBC Count: 30 10*3/uL — ABNORMAL HIGH (ref 4.0–10.5)
nRBC: 0 % (ref 0.0–0.2)

## 2022-10-05 LAB — CMP (CANCER CENTER ONLY)
ALT: 10 U/L (ref 0–44)
AST: 17 U/L (ref 15–41)
Albumin: 4.4 g/dL (ref 3.5–5.0)
Alkaline Phosphatase: 76 U/L (ref 38–126)
Anion gap: 6 (ref 5–15)
BUN: 21 mg/dL (ref 8–23)
CO2: 28 mmol/L (ref 22–32)
Calcium: 9.7 mg/dL (ref 8.9–10.3)
Chloride: 104 mmol/L (ref 98–111)
Creatinine: 1.21 mg/dL (ref 0.61–1.24)
GFR, Estimated: >60 mL/min (ref >60)
Glucose, Bld: 107 mg/dL — ABNORMAL HIGH (ref 70–99)
Potassium: 4.4 mmol/L (ref 3.5–5.1)
Sodium: 138 mmol/L (ref 135–145)
Total Bilirubin: 0.4 mg/dL (ref 0.3–1.2)
Total Protein: 7.2 g/dL (ref 6.5–8.1)

## 2022-10-05 NOTE — Progress Notes (Signed)
Hematology and Oncology Follow Up Visit  Gregory Blankenship [id number] [birth year] 81 y.o. 10/05/2022 11:38 AM   Principle Diagnosis: 81 year old man with JAK2 positive polycythemia vera diagnosed in 2013.  Secondary diagnosis:   Prostate cancer diagnosed in 2011. He was found to have a Gleason score 3+4 = 7 after a prostatectomy completed on 06/20/2010. The final pathology showed a stage T2c disease.   Prior therapies  Phlebotomy to keep his hematocrit less than 45.  Hydroxyurea 500 mg twice a day started in June 2019.    Current therapy:    Hydroxyurea 500 mg daily except for 3 day he takes 1000 mg.  This will be changed to 1000 mg 2 days a week on July 06, 2022.  This will be changed to 3 days a week of 1000 mg and 4 days of 500 mg on October 05, 2022.  Interim History: Gregory Blankenship returns today for a follow-up visit.  Since the last visit, since the last visit, he was diagnosed with gout and was treated with prednisone and allopurinol with improvement in his symptoms.  He is currently on allopurinol alone.  He denies any bleeding or thrombosis episodes.  He denies any excessive fatigue or tiredness.  Scabs predominantly affecting his fingers.  He denies any complications related to hydroxyurea.  Denies any skin rashes or lesions.     Medications: Updated on review. Current Outpatient Medications  Medication Sig Dispense Refill   isosorbide mononitrate (IMDUR) 30 MG 24 hr tablet TAKE 1 TABLET(30 MG) BY MOUTH DAILY 90 tablet 3   acetaminophen (TYLENOL) 325 MG tablet Take 325 mg by mouth every 6 (six) hours as needed for mild pain.      aspirin 81 MG tablet Take 81 mg by mouth every evening.      carvedilol (COREG) 6.25 MG tablet Take 1 tablet (6.25 mg total) by mouth 2 (two) times daily. 180 tablet 3   hydroxyurea (HYDREA) 500 MG capsule TAKE ONE CAPSULE BY MOUTH TWICE DAILY. MAY TAKE WITH FOOD TO MINIMIZE GI SIDE EFFECTS 270 capsule 3   levothyroxine (SYNTHROID, LEVOTHROID)  100 MCG tablet   5   nitroGLYCERIN (NITROSTAT) 0.4 MG SL tablet Place 1 tablet (0.4 mg total) under the tongue every 5 (five) minutes as needed for chest pain. 25 tablet 7   simvastatin (ZOCOR) 20 MG tablet Take 20 mg every evening by mouth.      warfarin (COUMADIN) 5 MG tablet Take 5 mg See admin instructions by mouth. In the evening on Mon/Wed/Fri     No current facility-administered medications for this visit.    Allergies:  Allergies  Allergen Reactions   Erythromycin Nausea Only       Physical Exam:    Blood pressure 112/60, pulse 61, temperature (!) 97.2 F (36.2 C), temperature source Temporal, resp. rate 17, height '6\' 1"'$  (1.854 m), weight 190 lb 1.6 oz (86.2 kg), SpO2 98 %.   ECOG: 1   General appearance: Comfortable appearing without any discomfort Head: Normocephalic without any trauma Oropharynx: Mucous membranes are moist and pink without any thrush or ulcers. Eyes: Pupils are equal and round reactive to light. Lymph nodes: No cervical, supraclavicular, inguinal or axillary lymphadenopathy.   Heart:regular rate and rhythm.  S1 and S2 without leg edema. Lung: Clear without any rhonchi or wheezes.  No dullness to percussion. Abdomin: Soft, nontender, nondistended with good bowel sounds.  No hepatosplenomegaly. Musculoskeletal: No joint deformity or effusion.  Full range of motion noted. Neurological: No  deficits noted on motor, sensory and deep tendon reflex exam. Skin: No petechial rash or dryness.  Appeared moist.          .     Lab Results: Lab Results  Component Value Date   WBC 15.7 (H) 07/06/2022   HGB 12.0 (L) 07/06/2022   HCT 35.0 (L) 07/06/2022   MCV 107.7 (H) 07/06/2022   PLT 566 (H) 07/06/2022     Chemistry      Component Value Date/Time   NA 136 07/06/2022 0920   NA 140 05/09/2017 0844   K 4.7 07/06/2022 0920   K 4.3 05/09/2017 0844   CL 101 07/06/2022 0920   CL 102 02/05/2013 1429   CO2 29 07/06/2022 0920   CO2 25  05/09/2017 0844   BUN 22 07/06/2022 0920   BUN 16.7 05/09/2017 0844   CREATININE 1.28 (H) 07/06/2022 0920   CREATININE 1.2 05/09/2017 0844      Component Value Date/Time   CALCIUM 9.0 07/06/2022 0920   CALCIUM 9.4 05/09/2017 0844   ALKPHOS 71 07/06/2022 0920   ALKPHOS 99 05/09/2017 0844   AST 29 07/06/2022 0920   AST 49 (H) 05/09/2017 0844   ALT 25 07/06/2022 0920   ALT 41 05/09/2017 0844   BILITOT 0.6 07/06/2022 0920   BILITOT 0.55 05/09/2017 0844         Impression and Plan:  81 year old man with:  1.  JAK2 positive polycythemia vera diagnosed in 2013.   He continues to be on hydroxyurea without any major complications.  Complication associated with this treatment did include myelosuppression, GI toxicity, pruritus among others.  His counts remain under reasonable control without any need for phlebotomy.  Laboratory data from today did show increase in his counts including white cells and platelets which is likely reactive related to his recent gout flare.  After discussion today, I opted to increase his hydroxyurea to 1000 mg 3 days a week and he will continue at 500 mg the remaining days.  He is agreeable to proceed.  2. Thrombosis prophylaxis: No bleeding or thrombosis noted at this time.  He continues to be on full dose anticoagulation for cardiac reasons.  3. 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  The recent rise is likely related to his gout flare and prednisone.  Overall his counts under control with the hydroxyurea dosing.  4. Follow-up: Will return in 3 months for repeat evaluation.   30  minutes were dedicated to this visit.  The time was spent on updating disease status, treatment choices and outlining future plan of care review.   Zola Button, MD 12/28/202311:38 AM

## 2022-10-24 DIAGNOSIS — L570 Actinic keratosis: Secondary | ICD-10-CM | POA: Diagnosis not present

## 2022-11-22 ENCOUNTER — Telehealth: Payer: Self-pay | Admitting: Cardiovascular Disease

## 2022-11-22 NOTE — Telephone Encounter (Signed)
*  STAT* If patient is at the pharmacy, call can be transferred to refill team.   1. Which medications need to be refilled? (please list name of each medication and dose if known)   nitroGLYCERIN (NITROSTAT) 0.4 MG SL tablet    2. Which pharmacy/location (including street and city if local pharmacy) is medication to be sent to? [place], [place]   3. Do they need a 30 day or 90 day supply?  90 day

## 2022-11-25 ENCOUNTER — Encounter: Payer: Self-pay | Admitting: Hematology

## 2022-11-27 ENCOUNTER — Other Ambulatory Visit: Payer: Self-pay

## 2022-11-27 MED ORDER — NITROGLYCERIN 0.4 MG SL SUBL: 0.4000 mg | SUBLINGUAL_TABLET | SUBLINGUAL | 3 refills | Status: DC | PRN

## 2022-11-28 ENCOUNTER — Telehealth: Payer: Self-pay

## 2022-11-28 NOTE — Telephone Encounter (Signed)
The patient called and requested a refill on his nitroglycerin and was told that the refill was sent to [facility] on [address] yesterday.

## 2022-12-25 ENCOUNTER — Other Ambulatory Visit: Payer: Self-pay

## 2022-12-25 ENCOUNTER — Encounter: Payer: Self-pay | Admitting: Hematology

## 2022-12-25 ENCOUNTER — Inpatient Hospital Stay: Payer: Medicare Other | Attending: Oncology

## 2022-12-25 ENCOUNTER — Inpatient Hospital Stay: Payer: Medicare Other | Admitting: Hematology

## 2022-12-25 ENCOUNTER — Inpatient Hospital Stay: Payer: Medicare Other

## 2022-12-25 VITALS — BP 125/85 | HR 51 | Temp 97.5°F | Resp 18 | Wt 187.9 lb

## 2022-12-25 VITALS — BP 160/88 | HR 52 | Resp 16

## 2022-12-25 DIAGNOSIS — D45 Polycythemia vera: Secondary | ICD-10-CM | POA: Insufficient documentation

## 2022-12-25 DIAGNOSIS — I2699 Other pulmonary embolism without acute cor pulmonale: Secondary | ICD-10-CM

## 2022-12-25 LAB — CMP (CANCER CENTER ONLY)
ALT: 9 U/L (ref 0–44)
AST: 17 U/L (ref 15–41)
Albumin: 4.2 g/dL (ref 3.5–5.0)
Alkaline Phosphatase: 64 U/L (ref 38–126)
Anion gap: 5 (ref 5–15)
BUN: 21 mg/dL (ref 8–23)
CO2: 28 mmol/L (ref 22–32)
Calcium: 9.1 mg/dL (ref 8.9–10.3)
Chloride: 103 mmol/L (ref 98–111)
Creatinine: 1.3 mg/dL — ABNORMAL HIGH (ref 0.61–1.24)
GFR, Estimated: 56 mL/min — ABNORMAL LOW (ref >60)
Glucose, Bld: 87 mg/dL (ref 70–99)
Potassium: 4 mmol/L (ref 3.5–5.1)
Sodium: 136 mmol/L (ref 135–145)
Total Bilirubin: 0.6 mg/dL (ref 0.3–1.2)
Total Protein: 6.8 g/dL (ref 6.5–8.1)

## 2022-12-25 LAB — CBC WITH DIFFERENTIAL (CANCER CENTER ONLY)
Abs Immature Granulocytes: 0.49 10*3/uL — ABNORMAL HIGH (ref 0.00–0.07)
Basophils Absolute: 0.3 10*3/uL — ABNORMAL HIGH (ref 0.0–0.1)
Basophils Relative: 2 %
Eosinophils Absolute: 0.3 10*3/uL (ref 0.0–0.5)
Eosinophils Relative: 2 %
HCT: 49.8 % (ref 39.0–52.0)
Hemoglobin: 16.5 g/dL (ref 13.0–17.0)
Immature Granulocytes: 3 %
Lymphocytes Relative: 12 %
Lymphs Abs: 2 10*3/uL (ref 0.7–4.0)
MCH: 32.7 pg (ref 26.0–34.0)
MCHC: 33.1 g/dL (ref 30.0–36.0)
MCV: 98.8 fL (ref 80.0–100.0)
Monocytes Absolute: 1.1 10*3/uL — ABNORMAL HIGH (ref 0.1–1.0)
Monocytes Relative: 7 %
Neutro Abs: 12.2 10*3/uL — ABNORMAL HIGH (ref 1.7–7.7)
Neutrophils Relative %: 74 %
Platelet Count: 428 10*3/uL — ABNORMAL HIGH (ref 150–400)
RBC: 5.04 MIL/uL (ref 4.22–5.81)
RDW: 14.6 % (ref 11.5–15.5)
WBC Count: 16.3 10*3/uL — ABNORMAL HIGH (ref 4.0–10.5)
nRBC: 0 % (ref 0.0–0.2)

## 2022-12-25 NOTE — Progress Notes (Signed)
HEMATOLOGY/ONCOLOGY CONSULTATION NOTE  Date of Service: 12/25/2022  Patient Care Team: Seward Carol, MD as PCP - General (Internal Medicine) Lorretta Harp, MD as PCP - Cardiology (Cardiology)  CHIEF COMPLAINTS/PURPOSE OF CONSULTATION:  Polycythemia vera  Prior therapies   Phlebotomy to keep his hematocrit less than 45.   Hydroxyurea 500 mg twice a day started in June 2019.     Current therapy:     Hydroxyurea 500 mg daily except for 3 day he takes 1000 mg.  This will be changed to 1000 mg 2 days a week on July 06, 2022.  This will be changed to 3 days a week of 1000 mg and 4 days of 500 mg on October 05, 2022.  HISTORY OF PRESENTING ILLNESS:   Gregory Blankenship. is a wonderful 82 y.o. 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 Patient has been transferred to [place] from Dr. Alen Blew. 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 He was also diagnosed with Prostate cancer in 2011 and was found to have a Gleason score 3+4 = 7 after prostatectomy on 06/20/2010. The final pathology showed a stage T2c disease.   Patient's current treatment includes hydroxyurea 1000 mg 3 days a week and 500mg  po daily the remaining 4 days of the week.   Patient was last seen by Dr. Alen Blew on 10/05/2022 and he was doing well overall. He noted that he was diagnosed with gout and has been getting treated with Prednisone and Allopurinol.   Patient reports he has been doing well overall without any new medical concerns since his last visit with Dr. Alen Blew. He reports his last phlebotomy was around 6 months ago and has been receiving phlebotomies as needed.   Patient has been taking Hydroxyurea as prescribed without any toxicities.   He denies any medication changes, exercise changes, or diet changes.   Patient notes he had an heart attack in 2013 and pulmonary embolism in the past. He is currently taking Coumadin 5 mg due to  pulmonary embolism. He has been tolerating his Coumadin well without any toxicities.   He has been staying hydrated, drinking around 2 L of water everyday. He denies smoking cigarettes and consuming alcohol. He drink half a glass of orange juice everyday.   Patient notes that his prostate cancer is stable with PSA in the normal ranges and he regularly follows-up with his Urologist.   He regularly follows-up with his Dermatologist for basal cell carcinoma. He denies any history of squamous cell carcinoma.   He denies fever, chills, night sweats, infection issues, leg or hand tingling sensation, unexpected weight loss, abdominal pain, back pain, chest pain, or leg swelling.    MEDICAL HISTORY:  Past Medical History:  Diagnosis Date   Arthritis    back   Blood dyscrasia    Cancer [facility])    prostate surgery- robotic surgery    Coronary artery disease    Hyperlipidemia    Hypertension    patient denies   Hypothyroid    Myocardial infarction [facility]) 2014   Peripheral vascular disease ([place])    phlebitis   Polycythemia    Pulmonary embolism ([place])    Varicose veins     SURGICAL HISTORY: Past Surgical History:  Procedure Laterality Date   CARDIAC CATHETERIZATION  2014   not able to do stent    COLONOSCOPY WITH PROPOFOL N/A 05/16/2016   Procedure: COLONOSCOPY WITH PROPOFOL;  Surgeon: Ursula Alert  Wynetta Emery, MD;  Location: [facility];  Service: Endoscopy;  Laterality: N/A;   INGUINAL HERNIA REPAIR Right 08/21/2017   Procedure: LAPAROSCOPIC RIGHT  INGUINAL HERNIA  REPAIR WITH MESH;  Surgeon: Ralene Ok, MD;  Location: [place];  Service: General;  Laterality: Right;   INSERTION OF MESH Right 08/21/2017   Procedure: INSERTION OF MESH;  Surgeon: Ralene Ok, MD;  Location: [place];  Service: General;  Laterality: Right;   LEFT HEART CATHETERIZATION WITH CORONARY ANGIOGRAM N/A 12/02/2011   Procedure: LEFT HEART CATHETERIZATION WITH CORONARY ANGIOGRAM;  Surgeon: Lorretta Harp, MD;   Location: [facility] CATH LAB;  Service: Cardiovascular;  Laterality: N/A;   PERCUTANEOUS CORONARY INTERVENTION-BALLOON ONLY  12/02/2011   Procedure: PERCUTANEOUS CORONARY INTERVENTION-BALLOON ONLY;  Surgeon: Lorretta Harp, MD;  Location: [facility] CATH LAB;  Service: Cardiovascular;;  Chronic total occlusion   PROSTATE SURGERY  2011   VASCULAR SURGERY     vericose veins in legs     SOCIAL HISTORY: Social History   Socioeconomic History   Marital status: Married    Spouse name: [name]   Number of children: 2   Years of education: college   Highest education level: Not on file  Occupational History   Not on file  Tobacco Use   Smoking status: Former    Types: Cigarettes    Quit date: 12/01/1968    Years since quitting: 54.1   Smokeless tobacco: Never  Vaping Use   Vaping Use: Never used  Substance and Sexual Activity   Alcohol use: No    Comment: hx of 5-6 years ago an occasional beer or wine   Drug use: No   Sexual activity: Not on file  Other Topics Concern   Not on file  Social History Narrative   Lives with wife, [name]   Caffeine use: very little   Right handed    Social Determinants of Health   Financial Resource Strain: Not on file  Food Insecurity: Not on file  Transportation Needs: Not on file  Physical Activity: Not on file  Stress: Not on file  Social Connections: Not on file  Intimate Partner Violence: Not on file    FAMILY HISTORY: Family History  Problem Relation Age of Onset   Varicose Veins Mother    Cancer Mother    Cancer Father    Coronary artery disease Father        cabg x5   Stroke Maternal Grandfather    Cancer Brother     ALLERGIES:  is allergic to erythromycin.  MEDICATIONS:  Current Outpatient Medications  Medication Sig Dispense Refill   isosorbide mononitrate (IMDUR) 30 MG 24 hr tablet TAKE 1 TABLET(30 MG) BY MOUTH DAILY 90 tablet 3   acetaminophen (TYLENOL) 325 MG tablet Take 325 mg by mouth every 6 (six) hours as needed for mild pain.       aspirin 81 MG tablet Take 81 mg by mouth every evening.      carvedilol (COREG) 6.25 MG tablet Take 1 tablet (6.25 mg total) by mouth 2 (two) times daily. 180 tablet 3   hydroxyurea (HYDREA) 500 MG capsule TAKE ONE CAPSULE BY MOUTH TWICE DAILY. MAY TAKE WITH FOOD TO MINIMIZE GI SIDE EFFECTS 270 capsule 3   levothyroxine (SYNTHROID, LEVOTHROID) 100 MCG tablet   5   nitroGLYCERIN (NITROSTAT) 0.4 MG SL tablet Place 1 tablet (0.4 mg total) under the tongue every 5 (five) minutes as needed for chest pain. 25 tablet 3   simvastatin (ZOCOR) 20 MG tablet  Take 20 mg every evening by mouth.      warfarin (COUMADIN) 5 MG tablet Take 5 mg See admin instructions by mouth. In the evening on Mon/Wed/Fri     No current facility-administered medications for this visit.    REVIEW OF SYSTEMS:    10 Point review of Systems was done is negative except as noted above.  PHYSICAL EXAMINATION: ECOG PERFORMANCE STATUS: 1 - Symptomatic but completely ambulatory  .There were no vitals filed for this visit. There were no vitals filed for this visit. .There is no height or weight on file to calculate BMI.  GENERAL:alert, in no acute distress and comfortable SKIN: no acute rashes, no significant lesions EYES: conjunctiva are pink and non-injected, sclera anicteric OROPHARYNX: MMM, no exudates, no oropharyngeal erythema or ulceration NECK: supple, no JVD LYMPH:  no palpable lymphadenopathy in the cervical, axillary or inguinal regions LUNGS: clear to auscultation b/l with normal respiratory effort HEART: regular rate & rhythm ABDOMEN:  normoactive bowel sounds , non tender, not distended. Extremity: no pedal edema PSYCH: alert & oriented x 3 with fluent speech NEURO: no focal motor/sensory deficits  LABORATORY DATA:  I have reviewed the data as listed     Latest Ref Rng & Units 12/25/2022    9:45 AM 10/05/2022   11:28 AM 07/06/2022    9:20 AM  CBC  WBC 4.0 - 10.5 K/uL 16.3  30.0  15.7   Hemoglobin  13.0 - 17.0 g/dL 16.5  14.3  12.0   Hematocrit 39.0 - 52.0 % 49.8  41.8  35.0   Platelets 150 - 400 K/uL 428  756  566     .    Latest Ref Rng & Units 12/25/2022    9:45 AM 10/05/2022   11:28 AM 07/06/2022    9:20 AM  CMP  Glucose 70 - 99 mg/dL 87  107  81   BUN 8 - 23 mg/dL 21  21  22    Creatinine 0.61 - 1.24 mg/dL 1.30  1.21  1.28   Sodium 135 - 145 mmol/L 136  138  136   Potassium 3.5 - 5.1 mmol/L 4.0  4.4  4.7   Chloride 98 - 111 mmol/L 103  104  101   CO2 22 - 32 mmol/L 28  28  29    Calcium 8.9 - 10.3 mg/dL 9.1  9.7  9.0   Total Protein 6.5 - 8.1 g/dL 6.8  7.2  6.8   Total Bilirubin 0.3 - 1.2 mg/dL 0.6  0.4  0.6   Alkaline Phos 38 - 126 U/L 64  76  71   AST 15 - 41 U/L 17  17  29    ALT 0 - 44 U/L 9  10  25       RADIOGRAPHIC STUDIES: I have personally reviewed the radiological images as listed and agreed with the findings in the report. No results found.  ASSESSMENT & PLAN:   82 year old man with:   1.  JAK2 positive polycythemia vera diagnosed in 2013.    He continues to be on hydroxyurea without any major complications.  Complication associated with this treatment did include myelosuppression, GI toxicity, pruritus among others.  His counts remain under reasonable control without any need for phlebotomy.  Laboratory data from today did show increase in his counts including white cells and platelets which is likely reactive related to his recent gout flare.  2. Thrombosis prophylaxis: No bleeding or thrombosis noted at this time.  He continues to be  on full dose anticoagulation for cardiac reasons.  3. 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  The recent rise is likely related to his gout flare and prednisone.  Overall his counts under control with the hydroxyurea dosing.  PLAN: -patient has transferred care from Dr Alen Blew. All the hematologic and oncologic hx was reviewed and confirmed in  details with the patient. -Discussed lab results from today, 12/25/2022, with the patient. CBC shows slightly elevated WBC of 16.3 and slightly elevated platelets of 428. Hematocrit of 49.8%. Platelets counts have improved from 756 K to 428 K. WBC counts have improved from 30 K to 16.3 K. CMP shows slightly elevated creatinine of 1.30.  -Discussed our goal is to keep platelet count between 200-400 K and hematocrit goal of around 45%. -Recommended to minimize iron rich food and drinks to avoid phlebotomies.  -Patient will receive phlebotomy during this visit.  -Discussed that phlebotomy will be every 2-3 months.  -educated the patient on JAK-2 mutation.  -Patient has been tolerating his hydroxyurea well without any toxicities.  -Patient will continue Hydroxyurea as prescribed.   FOLLOW-UP: Therapeutic phlebotomy today/ASAP RTC with Dr Irene Limbo with labs and appointment for therapeutic phlebotomy in 2 months    .The total time spent in the appointment was 41 minutes* .  All of the patient's questions were answered with apparent satisfaction. The patient knows to call the clinic with any problems, questions or concerns.   Sullivan Lone MD MS AAHIVMS [facility] Hematology/Oncology Physician [facility]  .*Total Encounter Time as defined by the Centers for Medicare and Medicaid Services includes, in addition to the face-to-face time of a patient visit (documented in the note above) non-face-to-face time: obtaining and reviewing outside history, ordering and reviewing medications, tests or procedures, care coordination (communications with other health care professionals or caregivers) and documentation in the medical record.  12/25/2022 10:18 AM   I, Cleda Mccreedy, am acting as a Education administrator for Sullivan Lone, MD.  .I have reviewed the above documentation for accuracy and completeness, and I agree with the above. Brunetta Genera MD

## 2022-12-25 NOTE — Patient Instructions (Signed)

## 2022-12-25 NOTE — Progress Notes (Signed)
Gregory Blankenship. presents today for phlebotomy per MD orders. Phlebotomy procedure started at 1143 and ended at 1150. 520 grams removed. Patient observed for 30 minutes after procedure without any incident. Patient tolerated procedure well. 16G IV needle to RAC. IV needle removed intact.

## 2022-12-26 ENCOUNTER — Telehealth: Payer: Self-pay | Admitting: Hematology

## 2022-12-26 DIAGNOSIS — Z85828 Personal history of other malignant neoplasm of skin: Secondary | ICD-10-CM | POA: Diagnosis not present

## 2022-12-26 DIAGNOSIS — L570 Actinic keratosis: Secondary | ICD-10-CM | POA: Diagnosis not present

## 2022-12-26 DIAGNOSIS — L821 Other seborrheic keratosis: Secondary | ICD-10-CM | POA: Diagnosis not present

## 2022-12-26 DIAGNOSIS — L812 Freckles: Secondary | ICD-10-CM | POA: Diagnosis not present

## 2022-12-26 DIAGNOSIS — D0439 Carcinoma in situ of skin of other parts of face: Secondary | ICD-10-CM | POA: Diagnosis not present

## 2022-12-26 NOTE — Telephone Encounter (Signed)
Called patient per 3/18 los notes to schedule f/u. Spoke with patient's wife. Patient scheduled and notified.

## 2022-12-30 ENCOUNTER — Encounter: Payer: Self-pay | Admitting: Hematology

## 2023-02-08 ENCOUNTER — Other Ambulatory Visit: Payer: Self-pay

## 2023-02-08 DIAGNOSIS — D45 Polycythemia vera: Secondary | ICD-10-CM

## 2023-02-19 ENCOUNTER — Other Ambulatory Visit: Payer: Self-pay

## 2023-02-19 ENCOUNTER — Inpatient Hospital Stay: Payer: Medicare Other

## 2023-02-19 ENCOUNTER — Inpatient Hospital Stay: Payer: Medicare Other | Attending: Hematology

## 2023-02-19 ENCOUNTER — Inpatient Hospital Stay: Payer: Medicare Other | Admitting: Hematology

## 2023-02-19 VITALS — BP 110/73 | HR 56 | Temp 97.3°F | Resp 17 | Ht 73.0 in | Wt 186.2 lb

## 2023-02-19 DIAGNOSIS — D45 Polycythemia vera: Secondary | ICD-10-CM

## 2023-02-19 LAB — CBC WITH DIFFERENTIAL (CANCER CENTER ONLY)
Abs Immature Granulocytes: 0.32 K/uL — ABNORMAL HIGH (ref 0.00–0.07)
Basophils Absolute: 0.3 K/uL — ABNORMAL HIGH (ref 0.0–0.1)
Basophils Relative: 2 %
Eosinophils Absolute: 0.3 K/uL (ref 0.0–0.5)
Eosinophils Relative: 2 %
HCT: 48.9 % (ref 39.0–52.0)
Hemoglobin: 16 g/dL (ref 13.0–17.0)
Immature Granulocytes: 2 %
Lymphocytes Relative: 14 %
Lymphs Abs: 2.2 K/uL (ref 0.7–4.0)
MCH: 31.2 pg (ref 26.0–34.0)
MCHC: 32.7 g/dL (ref 30.0–36.0)
MCV: 95.3 fL (ref 80.0–100.0)
Monocytes Absolute: 1.2 K/uL — ABNORMAL HIGH (ref 0.1–1.0)
Monocytes Relative: 8 %
Neutro Abs: 11.2 K/uL — ABNORMAL HIGH (ref 1.7–7.7)
Neutrophils Relative %: 72 %
Platelet Count: 535 K/uL — ABNORMAL HIGH (ref 150–400)
RBC: 5.13 MIL/uL (ref 4.22–5.81)
RDW: 15.9 % — ABNORMAL HIGH (ref 11.5–15.5)
WBC Count: 15.5 K/uL — ABNORMAL HIGH (ref 4.0–10.5)
nRBC: 0 % (ref 0.0–0.2)

## 2023-02-19 LAB — CMP (CANCER CENTER ONLY)
ALT: 11 U/L (ref 0–44)
AST: 20 U/L (ref 15–41)
Albumin: 4.5 g/dL (ref 3.5–5.0)
Alkaline Phosphatase: 65 U/L (ref 38–126)
Anion gap: 5 (ref 5–15)
BUN: 22 mg/dL (ref 8–23)
CO2: 29 mmol/L (ref 22–32)
Calcium: 9.3 mg/dL (ref 8.9–10.3)
Chloride: 103 mmol/L (ref 98–111)
Creatinine: 1.28 mg/dL — ABNORMAL HIGH (ref 0.61–1.24)
GFR, Estimated: 57 mL/min — ABNORMAL LOW
Glucose, Bld: 72 mg/dL (ref 70–99)
Potassium: 4.7 mmol/L (ref 3.5–5.1)
Sodium: 137 mmol/L (ref 135–145)
Total Bilirubin: 0.7 mg/dL (ref 0.3–1.2)
Total Protein: 7 g/dL (ref 6.5–8.1)

## 2023-02-19 MED ORDER — HYDROXYUREA 500 MG PO CAPS
ORAL_CAPSULE | ORAL | 3 refills | Status: DC
Start: 2023-02-19 — End: 2023-05-21

## 2023-02-19 NOTE — Progress Notes (Signed)
Patient here for theraputic phlebotomy per Drs orders. 16 gauge phleb kit used in left ac started at 1016 and finished at 1022. 523 grams removed. Patient given paper work and refreshments. Waited 30 with no issues.

## 2023-02-19 NOTE — Progress Notes (Signed)
Patient seen by Dr. Addison Naegeli are within treatment parameters. Pulse low 56 - per patient, it's always   Labs reviewed: and are within treatment parameters. Per MD, phlebotomy for hct over 45%. Hct today 48, patient to have phlebotomy today

## 2023-02-19 NOTE — Patient Instructions (Signed)

## 2023-02-19 NOTE — Progress Notes (Signed)
HEMATOLOGY/ONCOLOGY CLINIC NOTE  Date of Service: 02/19/2023  Patient Care Team: Renford Dills, MD as PCP - General (Internal Medicine) Runell Gess, MD as PCP - Cardiology (Cardiology)  CHIEF COMPLAINTS/PURPOSE OF CONSULTATION:  Polycythemia vera  Prior therapies   Phlebotomy to keep his hematocrit less than 45.   Hydroxyurea 500 mg twice a day started in June 2019.     Current therapy:     Hydroxyurea 500 mg daily except for 3 day he takes 1000 mg.  This will be changed to 1000 mg 2 days a week on July 06, 2022.  This will be changed to 3 days a week of 1000 mg and 4 days of 500 mg on October 05, 2022.  HISTORY OF PRESENTING ILLNESS:   Gregory Blankenship. is a wonderful 82 y.o. 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 Patient has been transferred to [place] from Dr. Clelia Croft. 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 He was also diagnosed with Prostate cancer in 2011 and was found to have a Gleason score 3+4 = 7 after prostatectomy on 06/20/2010. The final pathology showed a stage T2c disease.   Patient's current treatment includes hydroxyurea 1000 mg 3 days a week and 500mg  po daily the remaining 4 days of the week.   Patient was last seen by Dr. Clelia Croft on 10/05/2022 and he was doing well overall. He noted that he was diagnosed with gout and has been getting treated with Prednisone and Allopurinol.   Patient reports he has been doing well overall without any new medical concerns since his last visit with Dr. Clelia Croft. He reports his last phlebotomy was around 6 months ago and has been receiving phlebotomies as needed.   Patient has been taking Hydroxyurea as prescribed without any toxicities.   He denies any medication changes, exercise changes, or diet changes.   Patient notes he had an heart attack in 2013 and pulmonary embolism in the past. He is currently taking Coumadin 5 mg due to pulmonary  embolism. He has been tolerating his Coumadin well without any toxicities.   He has been staying hydrated, drinking around 2 L of water everyday. He denies smoking cigarettes and consuming alcohol. He drink half a glass of orange juice everyday.   Patient notes that his prostate cancer is stable with PSA in the normal ranges and he regularly follows-up with his Urologist.   He regularly follows-up with his Dermatologist for basal cell carcinoma. He denies any history of squamous cell carcinoma.   He denies fever, chills, night sweats, infection issues, leg or hand tingling sensation, unexpected weight loss, abdominal pain, back pain, chest pain, or leg swelling.   INTERVAL HISTORY: Gregory Blee. is a wonderful 82 y.o. 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   Patient was last seen by me on 12/25/2022 and he was doing well overall.   Patient reports he has been doing well overall without any new medical concerns since our last visit. He denies fever, chills, night sweats, unexpected weight loss, abdominal pain, back pain, chest pain, or leg swelling.  Patient notes he tolerated his last phlebotomy well without any toxicities.   He notes he is currently taking 2 hydroxyurea capsules 3 times a week (Monday, Wednesday, and Friday)  and 1 every other days. He has been tolerating his current hydroxyurea dosage well without any new or severe toxicities.  He is complaint with all of his medications.    MEDICAL HISTORY:  Past Medical History:  Diagnosis Date   Arthritis    back   Blood dyscrasia    Cancer [facility])    prostate surgery- robotic surgery    Coronary artery disease    Hyperlipidemia    Hypertension    patient denies   Hypothyroid    Myocardial infarction [facility]) 2014   Peripheral vascular disease (HCC)    phlebitis   Polycythemia    Pulmonary embolism (HCC)    Varicose veins     SURGICAL HISTORY: Past Surgical History:   Procedure Laterality Date   CARDIAC CATHETERIZATION  2014   not able to do stent    COLONOSCOPY WITH PROPOFOL N/A 05/16/2016   Procedure: COLONOSCOPY WITH PROPOFOL;  Surgeon: Charolett Bumpers, MD;  Location: [facility] ENDOSCOPY;  Service: Endoscopy;  Laterality: N/A;   INGUINAL HERNIA REPAIR Right 08/21/2017   Procedure: LAPAROSCOPIC RIGHT  INGUINAL HERNIA  REPAIR WITH MESH;  Surgeon: Axel Filler, MD;  Location: [facility] OR;  Service: General;  Laterality: Right;   INSERTION OF MESH Right 08/21/2017   Procedure: INSERTION OF MESH;  Surgeon: Axel Filler, MD;  Location: [facility] OR;  Service: General;  Laterality: Right;   LEFT HEART CATHETERIZATION WITH CORONARY ANGIOGRAM N/A 12/02/2011   Procedure: LEFT HEART CATHETERIZATION WITH CORONARY ANGIOGRAM;  Surgeon: Runell Gess, MD;  Location: [facility] CATH LAB;  Service: Cardiovascular;  Laterality: N/A;   PERCUTANEOUS CORONARY INTERVENTION-BALLOON ONLY  12/02/2011   Procedure: PERCUTANEOUS CORONARY INTERVENTION-BALLOON ONLY;  Surgeon: Runell Gess, MD;  Location: [facility] CATH LAB;  Service: Cardiovascular;;  Chronic total occlusion   PROSTATE SURGERY  2011   VASCULAR SURGERY     vericose veins in legs     SOCIAL HISTORY: Social History   Socioeconomic History   Marital status: Married    Spouse name: Gregory Blankenship   Number of children: 2   Years of education: college   Highest education level: Not on file  Occupational History   Not on file  Tobacco Use   Smoking status: Former    Types: Cigarettes    Quit date: 12/01/1968    Years since quitting: 54.2   Smokeless tobacco: Never  Vaping Use   Vaping Use: Never used  Substance and Sexual Activity   Alcohol use: No    Comment: hx of 5-6 years ago an occasional beer or wine   Drug use: No   Sexual activity: Not on file  Other Topics Concern   Not on file  Social History Narrative   Lives with wife, Gregory Blankenship   Caffeine use: very little   Right handed    Social Determinants of Health   Financial  Resource Strain: Not on file  Food Insecurity: Not on file  Transportation Needs: Not on file  Physical Activity: Not on file  Stress: Not on file  Social Connections: Not on file  Intimate Partner Violence: Not on file    FAMILY HISTORY: Family History  Problem Relation Age of Onset   Varicose Veins Mother    Cancer Mother    Cancer Father    Coronary artery disease Father        cabg x5   Stroke Maternal Grandfather    Cancer Brother     ALLERGIES:  is allergic to erythromycin.  MEDICATIONS:  Current Outpatient Medications  Medication Sig Dispense Refill   isosorbide mononitrate (IMDUR) 30 MG 24 hr tablet TAKE 1 TABLET(30 MG) BY  MOUTH DAILY 90 tablet 3   acetaminophen (TYLENOL) 325 MG tablet Take 325 mg by mouth every 6 (six) hours as needed for mild pain.      aspirin 81 MG tablet Take 81 mg by mouth every evening.      carvedilol (COREG) 6.25 MG tablet Take 1 tablet (6.25 mg total) by mouth 2 (two) times daily. 180 tablet 3   hydroxyurea (HYDREA) 500 MG capsule TAKE ONE CAPSULE BY MOUTH TWICE DAILY. MAY TAKE WITH FOOD TO MINIMIZE GI SIDE EFFECTS 270 capsule 3   levothyroxine (SYNTHROID, LEVOTHROID) 100 MCG tablet   5   nitroGLYCERIN (NITROSTAT) 0.4 MG SL tablet Place 1 tablet (0.4 mg total) under the tongue every 5 (five) minutes as needed for chest pain. 25 tablet 3   simvastatin (ZOCOR) 20 MG tablet Take 20 mg every evening by mouth.      warfarin (COUMADIN) 5 MG tablet Take 5 mg See admin instructions by mouth. In the evening on Mon/Wed/Fri     No current facility-administered medications for this visit.    REVIEW OF SYSTEMS:    10 Point review of Systems was done is negative except as noted above.  PHYSICAL EXAMINATION: ECOG PERFORMANCE STATUS: 1 - Symptomatic but completely ambulatory  . Vitals:   02/19/23 0927  BP: 110/73  Pulse: (!) 56  Resp: 17  Temp: (!) 97.3 F (36.3 C)  SpO2: 100%   Filed Weights   02/19/23 0927  Weight: 186 lb 3.2 oz (84.5 kg)    .Body mass index is 24.57 kg/m.  GENERAL:alert, in no acute distress and comfortable SKIN: no acute rashes, no significant lesions EYES: conjunctiva are pink and non-injected, sclera anicteric OROPHARYNX: MMM, no exudates, no oropharyngeal erythema or ulceration NECK: supple, no JVD LYMPH:  no palpable lymphadenopathy in the cervical, axillary or inguinal regions LUNGS: clear to auscultation b/l with normal respiratory effort HEART: regular rate & rhythm ABDOMEN:  normoactive bowel sounds , non tender, not distended. Extremity: no pedal edema PSYCH: alert & oriented x 3 with fluent speech NEURO: no focal motor/sensory deficits  LABORATORY DATA:  .    Latest Ref Rng & Units 02/19/2023    8:54 AM 12/25/2022    9:45 AM 10/05/2022   11:28 AM  CBC  WBC 4.0 - 10.5 K/uL 15.5  16.3  30.0   Hemoglobin 13.0 - 17.0 g/dL 16.1  09.6  04.5   Hematocrit 39.0 - 52.0 % 48.9  49.8  41.8   Platelets 150 - 400 K/uL 535  428  756    .    Latest Ref Rng & Units 02/19/2023    8:54 AM 12/25/2022    9:45 AM 10/05/2022   11:28 AM  CMP  Glucose 70 - 99 mg/dL 72  87  409   BUN 8 - 23 mg/dL 22  21  21    Creatinine 0.61 - 1.24 mg/dL 8.11  9.14  7.82   Sodium 135 - 145 mmol/L 137  136  138   Potassium 3.5 - 5.1 mmol/L 4.7  4.0  4.4   Chloride 98 - 111 mmol/L 103  103  104   CO2 22 - 32 mmol/L 29  28  28    Calcium 8.9 - 10.3 mg/dL 9.3  9.1  9.7   Total Protein 6.5 - 8.1 g/dL 7.0  6.8  7.2   Total Bilirubin 0.3 - 1.2 mg/dL 0.7  0.6  0.4   Alkaline Phos 38 - 126 U/L 65  64  76   AST 15 - 41 U/L 20  17  17    ALT 0 - 44 U/L 11  9  10        RADIOGRAPHIC STUDIES: I have personally reviewed the radiological images as listed and agreed with the findings in the report. No results found.  ASSESSMENT & PLAN:   82 year old man with:   1.  JAK2 positive polycythemia vera diagnosed in 2013.    He continues to be on hydroxyurea without any major complications.  Complication associated with this  treatment did include myelosuppression, GI toxicity, pruritus among others.  His counts remain under reasonable control without any need for phlebotomy.  Laboratory data from today did show increase in his counts including white cells and platelets which is likely reactive related to his recent gout flare.  2. Thrombosis prophylaxis: No bleeding or thrombosis noted at this time.  He continues to be on full dose anticoagulation for cardiac reasons.  3. 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  The recent rise is likely related to his gout flare and prednisone.  Overall his counts under control with the hydroxyurea dosing.  PLAN: -Discussed lab results from today, 02/19/2023, with the patient. CBC shows slightly elevated WBC at 15.5 K and elevated Platelets at 535 K. Hematocrit at 48.9 %.  CMP is stable . Lab Results  Component Value Date   CREATININE 1.28 (H) 02/19/2023   CREATININE 1.30 (H) 12/25/2022   CREATININE 1.21 10/05/2022    -Discussed our goal is to keep platelet count between 200-400 K and hematocrit goal of around 45%. -Recommended to minimize iron rich food and drinks to avoid phlebotomies.  -Patient will receive phlebotomy during this visit.  -Discussed that phlebotomy will be every 2-3 months. -Recommended to use sunscreen due to hydroxyurea.  -Patient has been tolerating his hydroxyurea well without any toxicities.  -Change Hydroxyurea to 2 times 4 days a week (Monday, Tuesday, Wednesday, and Friday). Continue one capsules on Thursday, Saturday, and Sunday.   FOLLOW-UP: Therapeutic phlebotomy today RTC with Dr Candise Che with labs and appointment for therapeutic phlebotomy in 3 months   The total time spent in the appointment was 30 minutes* .  All of the patient's questions were answered with apparent satisfaction. The patient knows to call the clinic with any problems, questions or  concerns.   Wyvonnia Lora MD MS AAHIVMS [facility] Hematology/Oncology Physician [facility]  .*Total Encounter Time as defined by the Centers for Medicare and Medicaid Services includes, in addition to the face-to-face time of a patient visit (documented in the note above) non-face-to-face time: obtaining and reviewing outside history, ordering and reviewing medications, tests or procedures, care coordination (communications with other health care professionals or caregivers) and documentation in the medical record.   I, Ok Edwards, am acting as a Neurosurgeon for Wyvonnia Lora, MD. .I have reviewed the above documentation for accuracy and completeness, and I agree with the above. Johney Maine MD

## 2023-02-20 ENCOUNTER — Telehealth: Payer: Self-pay | Admitting: Hematology

## 2023-02-22 DIAGNOSIS — L570 Actinic keratosis: Secondary | ICD-10-CM | POA: Diagnosis not present

## 2023-02-22 DIAGNOSIS — D4819 Other specified neoplasm of uncertain behavior of connective and other soft tissue: Secondary | ICD-10-CM | POA: Diagnosis not present

## 2023-02-22 DIAGNOSIS — C44629 Squamous cell carcinoma of skin of left upper limb, including shoulder: Secondary | ICD-10-CM | POA: Diagnosis not present

## 2023-02-25 ENCOUNTER — Encounter: Payer: Self-pay | Admitting: Hematology

## 2023-03-14 DIAGNOSIS — Z7901 Long term (current) use of anticoagulants: Secondary | ICD-10-CM | POA: Diagnosis not present

## 2023-04-09 DIAGNOSIS — Z7901 Long term (current) use of anticoagulants: Secondary | ICD-10-CM | POA: Diagnosis not present

## 2023-04-17 DIAGNOSIS — C44629 Squamous cell carcinoma of skin of left upper limb, including shoulder: Secondary | ICD-10-CM | POA: Diagnosis not present

## 2023-04-17 DIAGNOSIS — L988 Other specified disorders of the skin and subcutaneous tissue: Secondary | ICD-10-CM | POA: Diagnosis not present

## 2023-04-24 DIAGNOSIS — L853 Xerosis cutis: Secondary | ICD-10-CM | POA: Diagnosis not present

## 2023-04-24 DIAGNOSIS — Z85828 Personal history of other malignant neoplasm of skin: Secondary | ICD-10-CM | POA: Diagnosis not present

## 2023-04-24 DIAGNOSIS — L570 Actinic keratosis: Secondary | ICD-10-CM | POA: Diagnosis not present

## 2023-04-24 DIAGNOSIS — D0461 Carcinoma in situ of skin of right upper limb, including shoulder: Secondary | ICD-10-CM | POA: Diagnosis not present

## 2023-04-24 DIAGNOSIS — L820 Inflamed seborrheic keratosis: Secondary | ICD-10-CM | POA: Diagnosis not present

## 2023-04-24 DIAGNOSIS — Z85820 Personal history of malignant melanoma of skin: Secondary | ICD-10-CM | POA: Diagnosis not present

## 2023-04-24 DIAGNOSIS — L821 Other seborrheic keratosis: Secondary | ICD-10-CM | POA: Diagnosis not present

## 2023-05-14 DIAGNOSIS — Z7901 Long term (current) use of anticoagulants: Secondary | ICD-10-CM | POA: Diagnosis not present

## 2023-05-18 ENCOUNTER — Other Ambulatory Visit: Payer: Self-pay

## 2023-05-18 DIAGNOSIS — D45 Polycythemia vera: Secondary | ICD-10-CM

## 2023-05-21 ENCOUNTER — Inpatient Hospital Stay: Payer: Medicare Other | Admitting: Hematology

## 2023-05-21 ENCOUNTER — Other Ambulatory Visit: Payer: Self-pay

## 2023-05-21 ENCOUNTER — Inpatient Hospital Stay: Payer: Medicare Other

## 2023-05-21 ENCOUNTER — Inpatient Hospital Stay: Payer: Medicare Other | Attending: Hematology

## 2023-05-21 VITALS — BP 110/68 | HR 51 | Temp 97.8°F | Resp 18 | Wt 186.3 lb

## 2023-05-21 DIAGNOSIS — D45 Polycythemia vera: Secondary | ICD-10-CM

## 2023-05-21 DIAGNOSIS — I2699 Other pulmonary embolism without acute cor pulmonale: Secondary | ICD-10-CM

## 2023-05-21 DIAGNOSIS — Z8546 Personal history of malignant neoplasm of prostate: Secondary | ICD-10-CM | POA: Diagnosis not present

## 2023-05-21 DIAGNOSIS — Z79899 Other long term (current) drug therapy: Secondary | ICD-10-CM | POA: Insufficient documentation

## 2023-05-21 LAB — CMP (CANCER CENTER ONLY)
ALT: 11 U/L (ref 0–44)
AST: 17 U/L (ref 15–41)
Albumin: 4.2 g/dL (ref 3.5–5.0)
Alkaline Phosphatase: 57 U/L (ref 38–126)
Anion gap: 4 — ABNORMAL LOW (ref 5–15)
BUN: 23 mg/dL (ref 8–23)
CO2: 29 mmol/L (ref 22–32)
Calcium: 9.2 mg/dL (ref 8.9–10.3)
Chloride: 103 mmol/L (ref 98–111)
Creatinine: 1.26 mg/dL — ABNORMAL HIGH (ref 0.61–1.24)
GFR, Estimated: 58 mL/min — ABNORMAL LOW (ref >60)
Glucose, Bld: 71 mg/dL (ref 70–99)
Potassium: 5.2 mmol/L — ABNORMAL HIGH (ref 3.5–5.1)
Sodium: 136 mmol/L (ref 135–145)
Total Bilirubin: 0.6 mg/dL (ref 0.3–1.2)
Total Protein: 6.5 g/dL (ref 6.5–8.1)

## 2023-05-21 LAB — CBC WITH DIFFERENTIAL (CANCER CENTER ONLY)
Abs Immature Granulocytes: 0.16 10*3/uL — ABNORMAL HIGH (ref 0.00–0.07)
Basophils Absolute: 0.2 10*3/uL — ABNORMAL HIGH (ref 0.0–0.1)
Basophils Relative: 2 %
Eosinophils Absolute: 0.3 10*3/uL (ref 0.0–0.5)
Eosinophils Relative: 2 %
HCT: 43.8 % (ref 39.0–52.0)
Hemoglobin: 14.7 g/dL (ref 13.0–17.0)
Immature Granulocytes: 1 %
Lymphocytes Relative: 15 %
Lymphs Abs: 1.8 10*3/uL (ref 0.7–4.0)
MCH: 32 pg (ref 26.0–34.0)
MCHC: 33.6 g/dL (ref 30.0–36.0)
MCV: 95.2 fL (ref 80.0–100.0)
Monocytes Absolute: 0.7 10*3/uL (ref 0.1–1.0)
Monocytes Relative: 6 %
Neutro Abs: 9 10*3/uL — ABNORMAL HIGH (ref 1.7–7.7)
Neutrophils Relative %: 74 %
Platelet Count: 519 10*3/uL — ABNORMAL HIGH (ref 150–400)
RBC: 4.6 MIL/uL (ref 4.22–5.81)
RDW: 14.6 % (ref 11.5–15.5)
WBC Count: 12.1 10*3/uL — ABNORMAL HIGH (ref 4.0–10.5)
nRBC: 0 % (ref 0.0–0.2)

## 2023-05-21 MED ORDER — HYDROXYUREA 500 MG PO CAPS
ORAL_CAPSULE | ORAL | 3 refills | Status: DC
Start: 2023-05-21 — End: 2023-10-24

## 2023-05-21 NOTE — Progress Notes (Signed)
HEMATOLOGY/ONCOLOGY CLINIC NOTE  Date of Service: 05/21/2023  Patient Care Team: Renford Dills, MD as PCP - General (Internal Medicine) Runell Gess, MD as PCP - Cardiology (Cardiology)  CHIEF COMPLAINTS/PURPOSE OF CONSULTATION:  Polycythemia vera  Prior therapies   Phlebotomy to keep his hematocrit less than 45.   Hydroxyurea started in June 2019.     Current therapy:     Hydroxyurea   HISTORY OF PRESENTING ILLNESS:   Gregory Blankenship. is a wonderful 82 y.o. 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 Patient has been transferred to [place] from Dr. Clelia Croft. 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 He was also diagnosed with Prostate cancer in 2011 and was found to have a Gleason score 3+4 = 7 after prostatectomy on 06/20/2010. The final pathology showed a stage T2c disease.   Patient's current treatment includes hydroxyurea 1000 mg 3 days a week and 500mg  po daily the remaining 4 days of the week.   Patient was last seen by Dr. Clelia Croft on 10/05/2022 and he was doing well overall. He noted that he was diagnosed with gout and has been getting treated with Prednisone and Allopurinol.   Patient reports he has been doing well overall without any new medical concerns since his last visit with Dr. Clelia Croft. He reports his last phlebotomy was around 6 months ago and has been receiving phlebotomies as needed.   Patient has been taking Hydroxyurea as prescribed without any toxicities.   He denies any medication changes, exercise changes, or diet changes.   Patient notes he had an heart attack in 2013 and pulmonary embolism in the past. He is currently taking Coumadin 5 mg due to pulmonary embolism. He has been tolerating his Coumadin well without any toxicities.   He has been staying hydrated, drinking around 2 L of water everyday. He denies smoking cigarettes and consuming alcohol. He drink half a glass of  orange juice everyday.   Patient notes that his prostate cancer is stable with PSA in the normal ranges and he regularly follows-up with his Urologist.   He regularly follows-up with his Dermatologist for basal cell carcinoma. He denies any history of squamous cell carcinoma.   He denies fever, chills, night sweats, infection issues, leg or hand tingling sensation, unexpected weight loss, abdominal pain, back pain, chest pain, or leg swelling.   INTERVAL HISTORY: Gregory Cannada. is a wonderful 82 y.o. 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   Patient was last seen by me on 02/19/2023 and he was doing well overall.   Patient notes he has been doing well overall since our last visit without any new or severe medical concerns since our last visit.   He denies any new infection issues, fever, chills, night sweats, shortness of breath, unexpected weight loss, skin rashes, new skin bumps, chest pain, abdominal pain, back pain, or leg swelling.   He notes he is currently taking 2 hydroxyurea capsules 4 times a week (Monday, Tuesday, Wednesday, and Friday)  and 1 every other days. He has been tolerating his current hydroxyurea dosage well without any new or severe toxicities.  Patient notes he tolerated his last phlebotomy well without any toxicities.   Patient has been taking 1 pill of Warfarin 5 mg and Aspirin for a while.   He is complaint with all of his medications.   MEDICAL HISTORY:  Past Medical  History:  Diagnosis Date   Arthritis    back   Blood dyscrasia    Cancer (HCC)    prostate surgery- robotic surgery    Coronary artery disease    Hyperlipidemia    Hypertension    patient denies   Hypothyroid    Myocardial infarction [facility]) 2014   Peripheral vascular disease (HCC)    phlebitis   Polycythemia    Pulmonary embolism (HCC)    Varicose veins     SURGICAL HISTORY: Past Surgical History:  Procedure Laterality Date   CARDIAC  CATHETERIZATION  2014   not able to do stent    COLONOSCOPY WITH PROPOFOL N/A 05/16/2016   Procedure: COLONOSCOPY WITH PROPOFOL;  Surgeon: Charolett Bumpers, MD;  Location: [facility] ENDOSCOPY;  Service: Endoscopy;  Laterality: N/A;   INGUINAL HERNIA REPAIR Right 08/21/2017   Procedure: LAPAROSCOPIC RIGHT  INGUINAL HERNIA  REPAIR WITH MESH;  Surgeon: Axel Filler, MD;  Location: [facility] OR;  Service: General;  Laterality: Right;   INSERTION OF MESH Right 08/21/2017   Procedure: INSERTION OF MESH;  Surgeon: Axel Filler, MD;  Location: [facility] OR;  Service: General;  Laterality: Right;   LEFT HEART CATHETERIZATION WITH CORONARY ANGIOGRAM N/A 12/02/2011   Procedure: LEFT HEART CATHETERIZATION WITH CORONARY ANGIOGRAM;  Surgeon: Runell Gess, MD;  Location: [facility] CATH LAB;  Service: Cardiovascular;  Laterality: N/A;   PERCUTANEOUS CORONARY INTERVENTION-BALLOON ONLY  12/02/2011   Procedure: PERCUTANEOUS CORONARY INTERVENTION-BALLOON ONLY;  Surgeon: Runell Gess, MD;  Location: [facility] CATH LAB;  Service: Cardiovascular;;  Chronic total occlusion   PROSTATE SURGERY  2011   VASCULAR SURGERY     vericose veins in legs     SOCIAL HISTORY: Social History   Socioeconomic History   Marital status: Married    Spouse name: Gregory Blankenship   Number of children: 2   Years of education: college   Highest education level: Not on file  Occupational History   Not on file  Tobacco Use   Smoking status: Former    Current packs/day: 0.00    Types: Cigarettes    Quit date: 12/01/1968    Years since quitting: 54.5   Smokeless tobacco: Never  Vaping Use   Vaping status: Never Used  Substance and Sexual Activity   Alcohol use: No    Comment: hx of 5-6 years ago an occasional beer or wine   Drug use: No   Sexual activity: Not on file  Other Topics Concern   Not on file  Social History Narrative   Lives with wife, Gregory Blankenship   Caffeine use: very little   Right handed    Social Determinants of Health   Financial Resource  Strain: Not on file  Food Insecurity: Not on file  Transportation Needs: Not on file  Physical Activity: Not on file  Stress: Not on file  Social Connections: Not on file  Intimate Partner Violence: Not on file    FAMILY HISTORY: Family History  Problem Relation Age of Onset   Varicose Veins Mother    Cancer Mother    Cancer Father    Coronary artery disease Father        cabg x5   Stroke Maternal Grandfather    Cancer Brother     ALLERGIES:  is allergic to erythromycin.  MEDICATIONS:  Current Outpatient Medications  Medication Sig Dispense Refill   isosorbide mononitrate (IMDUR) 30 MG 24 hr tablet TAKE 1 TABLET(30 MG) BY MOUTH DAILY 90 tablet 3   acetaminophen (TYLENOL) 325  MG tablet Take 325 mg by mouth every 6 (six) hours as needed for mild pain.      aspirin 81 MG tablet Take 81 mg by mouth every evening.      carvedilol (COREG) 6.25 MG tablet Take 1 tablet (6.25 mg total) by mouth 2 (two) times daily. 180 tablet 3   hydroxyurea (HYDREA) 500 MG capsule Take 2 capsules (1000mg ) by mouth on Monday, Tuesday, Wednesday and Friday and 1 capsule(500mg ) by mouth on Thursday Saturday and Sunday. MAY TAKE WITH FOOD TO MINIMIZE GI SIDE EFFECTS 270 capsule 3   levothyroxine (SYNTHROID, LEVOTHROID) 100 MCG tablet   5   nitroGLYCERIN (NITROSTAT) 0.4 MG SL tablet Place 1 tablet (0.4 mg total) under the tongue every 5 (five) minutes as needed for chest pain. 25 tablet 3   simvastatin (ZOCOR) 20 MG tablet Take 20 mg every evening by mouth.      warfarin (COUMADIN) 5 MG tablet Take 5 mg See admin instructions by mouth. In the evening on Mon/Wed/Fri     No current facility-administered medications for this visit.    REVIEW OF SYSTEMS:    10 Point review of Systems was done is negative except as noted above.  PHYSICAL EXAMINATION: ECOG PERFORMANCE STATUS: 1 - Symptomatic but completely ambulatory  . Vitals:   05/21/23 0948  BP: 110/68  Pulse: (!) 51  Resp: 18  Temp: 97.8 F (36.6  C)  SpO2: 99%    Filed Weights   05/21/23 0948  Weight: 186 lb 4.8 oz (84.5 kg)   .Body mass index is 24.58 kg/m.  GENERAL:alert, in no acute distress and comfortable SKIN: no acute rashes, no significant lesions EYES: conjunctiva are pink and non-injected, sclera anicteric OROPHARYNX: MMM, no exudates, no oropharyngeal erythema or ulceration NECK: supple, no JVD LYMPH:  no palpable lymphadenopathy in the cervical, axillary or inguinal regions LUNGS: clear to auscultation b/l with normal respiratory effort HEART: regular rate & rhythm ABDOMEN:  normoactive bowel sounds , non tender, not distended. Extremity: no pedal edema PSYCH: alert & oriented x 3 with fluent speech NEURO: no focal motor/sensory deficits  LABORATORY DATA:  .    Latest Ref Rng & Units 05/21/2023    9:20 AM 02/19/2023    8:54 AM 12/25/2022    9:45 AM  CBC  WBC 4.0 - 10.5 K/uL 12.1  15.5  16.3   Hemoglobin 13.0 - 17.0 g/dL 74.2  59.5  63.8   Hematocrit 39.0 - 52.0 % 43.8  48.9  49.8   Platelets 150 - 400 K/uL 519  535  428    .    Latest Ref Rng & Units 05/21/2023    9:20 AM 02/19/2023    8:54 AM 12/25/2022    9:45 AM  CMP  Glucose 70 - 99 mg/dL 71  72  87   BUN 8 - 23 mg/dL 23  22  21    Creatinine 0.61 - 1.24 mg/dL 7.56  4.33  2.95   Sodium 135 - 145 mmol/L 136  137  136   Potassium 3.5 - 5.1 mmol/L 5.2  4.7  4.0   Chloride 98 - 111 mmol/L 103  103  103   CO2 22 - 32 mmol/L 29  29  28    Calcium 8.9 - 10.3 mg/dL 9.2  9.3  9.1   Total Protein 6.5 - 8.1 g/dL 6.5  7.0  6.8   Total Bilirubin 0.3 - 1.2 mg/dL 0.6  0.7  0.6   Alkaline Phos 38 -  126 U/L 57  65  64   AST 15 - 41 U/L 17  20  17    ALT 0 - 44 U/L 11  11  9        RADIOGRAPHIC STUDIES: I have personally reviewed the radiological images as listed and agreed with the findings in the report. No results found.  ASSESSMENT & PLAN:   82 year old man with:   1.  JAK2 positive polycythemia vera diagnosed in 2013.    He continues to be on  hydroxyurea without any major complications.  Complication associated with this treatment did include myelosuppression, GI toxicity, pruritus among others.  His counts remain under reasonable control without any need for phlebotomy.  Laboratory data from today did show increase in his counts including white cells and platelets which is likely reactive related to his recent gout flare.  2. Thrombosis prophylaxis: No bleeding or thrombosis noted at this time.  He continues to be on full dose anticoagulation for cardiac reasons.  3. 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  The recent rise is likely related to his gout flare and prednisone.  Overall his counts under control with the hydroxyurea dosing.  PLAN: -Discussed lab results from today, 05/21/2023, with the patient. CBC shows slightly elevated WBC at 12.1 K and elevated platelets at 519 K. CMP is stable overall. Hematocrit at 43.8%. -Discussed our goal is to keep platelet count less than 200K and hematocrit goal of around 45%. -Recommended to minimize iron rich food and drinks to avoid phlebotomies.  -No phlebotomy during this visit.  -Recommended to use sunscreen due to hydroxyurea.  -Patient has been tolerating his hydroxyurea well without any toxicities.  -Continue Hydroxyurea 2 tabs 4 days a week (Monday, Tuesday, Wednesday, and Friday). Continue one capsules on Thursday, Saturday, and Sunday.   FOLLOW-UP: RTC with Dr Candise Che with labs and appointment for therapeutic phlebotomy in 3 months   The total time spent in the appointment was 20 minutes* .  All of the patient's questions were answered with apparent satisfaction. The patient knows to call the clinic with any problems, questions or concerns.   Wyvonnia Lora MD MS AAHIVMS [facility] Hematology/Oncology Physician [facility]  .*Total Encounter Time as defined by the Centers for Medicare and  Medicaid Services includes, in addition to the face-to-face time of a patient visit (documented in the note above) non-face-to-face time: obtaining and reviewing outside history, ordering and reviewing medications, tests or procedures, care coordination (communications with other health care professionals or caregivers) and documentation in the medical record.   I, Ok Edwards, acting as a Neurosurgeon for Wyvonnia Lora, MD.,have documented all relevant documentation on the behalf of Wyvonnia Lora, MD,as directed by  Wyvonnia Lora, MD while in the presence of Wyvonnia Lora, MD.  .I have reviewed the above documentation for accuracy and completeness, and I agree with the above. Johney Maine MD

## 2023-05-22 ENCOUNTER — Telehealth: Payer: Self-pay | Admitting: Hematology

## 2023-05-22 NOTE — Telephone Encounter (Signed)
Patient is aware of scheduled appointment times/dates

## 2023-05-27 ENCOUNTER — Encounter: Payer: Self-pay | Admitting: Hematology

## 2023-06-12 DIAGNOSIS — Z7901 Long term (current) use of anticoagulants: Secondary | ICD-10-CM | POA: Diagnosis not present

## 2023-06-19 DIAGNOSIS — Z7901 Long term (current) use of anticoagulants: Secondary | ICD-10-CM | POA: Diagnosis not present

## 2023-07-09 DIAGNOSIS — Z7901 Long term (current) use of anticoagulants: Secondary | ICD-10-CM | POA: Diagnosis not present

## 2023-07-11 ENCOUNTER — Ambulatory Visit
Admission: RE | Admit: 2023-07-11 | Discharge: 2023-07-11 | Disposition: A | Discharge status: Home / Self Care | Payer: Medicare Other | Source: Ambulatory Visit | Attending: Internal Medicine | Admitting: Internal Medicine

## 2023-07-11 ENCOUNTER — Other Ambulatory Visit: Payer: Self-pay | Admitting: Internal Medicine

## 2023-07-11 DIAGNOSIS — I2510 Atherosclerotic heart disease of native coronary artery without angina pectoris: Secondary | ICD-10-CM | POA: Diagnosis not present

## 2023-07-11 DIAGNOSIS — Z0000 Encounter for general adult medical examination without abnormal findings: Secondary | ICD-10-CM | POA: Diagnosis not present

## 2023-07-11 DIAGNOSIS — E039 Hypothyroidism, unspecified: Secondary | ICD-10-CM | POA: Diagnosis not present

## 2023-07-11 DIAGNOSIS — D45 Polycythemia vera: Secondary | ICD-10-CM | POA: Diagnosis not present

## 2023-07-11 DIAGNOSIS — R0601 Orthopnea: Secondary | ICD-10-CM | POA: Diagnosis not present

## 2023-07-11 DIAGNOSIS — I10 Essential (primary) hypertension: Secondary | ICD-10-CM | POA: Diagnosis not present

## 2023-07-11 DIAGNOSIS — R059 Cough, unspecified: Secondary | ICD-10-CM | POA: Diagnosis not present

## 2023-07-11 DIAGNOSIS — N1831 Chronic kidney disease, stage 3a: Secondary | ICD-10-CM | POA: Diagnosis not present

## 2023-07-11 DIAGNOSIS — D6869 Other thrombophilia: Secondary | ICD-10-CM | POA: Diagnosis not present

## 2023-07-11 DIAGNOSIS — E7800 Pure hypercholesterolemia, unspecified: Secondary | ICD-10-CM | POA: Diagnosis not present

## 2023-07-11 DIAGNOSIS — I25119 Atherosclerotic heart disease of native coronary artery with unspecified angina pectoris: Secondary | ICD-10-CM | POA: Diagnosis not present

## 2023-07-19 DIAGNOSIS — Z86711 Personal history of pulmonary embolism: Secondary | ICD-10-CM | POA: Diagnosis not present

## 2023-07-23 DIAGNOSIS — Z7901 Long term (current) use of anticoagulants: Secondary | ICD-10-CM | POA: Diagnosis not present

## 2023-08-15 ENCOUNTER — Encounter: Payer: Self-pay | Admitting: Cardiovascular Disease

## 2023-08-15 ENCOUNTER — Encounter: Payer: Self-pay | Admitting: Hematology

## 2023-08-15 ENCOUNTER — Ambulatory Visit: Payer: Medicare Other | Attending: Cardiovascular Disease | Admitting: Cardiovascular Disease

## 2023-08-15 VITALS — BP 126/60 | HR 58 | Ht 72.0 in | Wt 191.0 lb

## 2023-08-15 DIAGNOSIS — I10 Essential (primary) hypertension: Secondary | ICD-10-CM

## 2023-08-15 DIAGNOSIS — I2699 Other pulmonary embolism without acute cor pulmonale: Secondary | ICD-10-CM

## 2023-08-15 DIAGNOSIS — I2102 ST elevation (STEMI) myocardial infarction involving left anterior descending coronary artery: Secondary | ICD-10-CM

## 2023-08-15 DIAGNOSIS — E782 Mixed hyperlipidemia: Secondary | ICD-10-CM

## 2023-08-15 DIAGNOSIS — I255 Ischemic cardiomyopathy: Secondary | ICD-10-CM

## 2023-08-15 NOTE — Assessment & Plan Note (Signed)
Last 2D echo performed 09/20/2017 revealed an EF of 40 to 45%.

## 2023-08-15 NOTE — Assessment & Plan Note (Signed)
History of remote pulmonary embolism on Coumadin anticoagulation. 

## 2023-08-15 NOTE — Assessment & Plan Note (Signed)
History of essential hypertension blood pressure measured today at 126/60.  He is on carvedilol.

## 2023-08-15 NOTE — Assessment & Plan Note (Signed)
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  He was on Coumadin anticoagulation because of prior pulm embolus.  I cathed him readily revealing a left dominant system with occluded mid LAD.  I tried to percutaneously recanalize him however this was unsuccessful suggestion it was "a CTO.  He did have an anterior wall motion abnormality with an EF of 30% with anteroapical dyskinesia which ultimately improved over time to an EF of 50%.  He is completely asymptomatic.

## 2023-08-15 NOTE — Patient Instructions (Signed)
Medication Instructions:   No changes *If you need a refill on your cardiac medications before your next appointment, please call your pharmacy*   Lab Work: Not  needed    Testing/Procedures:  Not needed  Follow-Up: At [facility], you and your health needs are our priority.  As part of our continuing mission to provide you with exceptional heart care, we have created designated Provider Care Teams.  These Care Teams include your primary Cardiologist (physician) and Advanced Practice Providers (APPs -  Physician Assistants and Nurse Practitioners) who all work together to provide you with the care you need, when you need it.     Your next appointment:   12 month(s)  The format for your next appointment:   In Person  Provider:   Nanetta Batty, MD

## 2023-08-15 NOTE — Progress Notes (Signed)
08/15/2023 Gregory Blankenship   [birth year]  [id number]  Primary Physician Renford Dills, MD Primary Cardiologist: Runell Gess MD FACP, [place], [place], [place]  HPI:  Gregory Blankenship. is a 82 y.o.  thin and fit-appearing, married Caucasian male, father of 2, grandfather to 5 grandchildren who I last saw in the office 08/16/2022. He has a history of an anterior ST-segment-elevation myocardial infarction December 02, 2011. He was on Coumadin anticoagulation because of prior pulmonary embolus. I cath'd him radially revealing a left dominant system with an occluded mid LAD. I tried to percutaneously recanalize him. However, this was unsuccessful probably because this was a "CTO." He did have anterior wall motion abnormalities and EF of 30% and anteroapical dyskinesia which ultimately improved over time to an EF of 50% by 2D echo in June of 2014. Marland Kitchen He participated in cardiac rehab.   He has a daughter who lives in [place] who he visits several times a year.    Since I saw him in the office a year ago he continues to do well.  He is fairly active and denies chest pain or shortness of breath.  He is planning a trip to [place] this coming February to visit his daughter and 2 grandchildren.  His major complaint is some dizziness which sounds like vertigo.   Current Meds  Medication Sig   acetaminophen (TYLENOL) 325 MG tablet Take 325 mg by mouth every 6 (six) hours as needed for mild pain.    aspirin 81 MG tablet Take 81 mg by mouth every evening.    carvedilol (COREG) 6.25 MG tablet Take 1 tablet (6.25 mg total) by mouth 2 (two) times daily.   hydroxyurea (HYDREA) 500 MG capsule Take 2 capsules (1000mg ) by mouth on Monday, Tuesday, Wednesday and Friday and 1 capsule(500mg ) by mouth on Thursday Saturday and Sunday. MAY TAKE WITH FOOD TO MINIMIZE GI SIDE EFFECTS   isosorbide mononitrate (IMDUR) 30 MG 24 hr tablet TAKE 1 TABLET(30 MG) BY MOUTH DAILY   levothyroxine (SYNTHROID, LEVOTHROID) 100  MCG tablet    nitroGLYCERIN (NITROSTAT) 0.4 MG SL tablet Place 1 tablet (0.4 mg total) under the tongue every 5 (five) minutes as needed for chest pain.   simvastatin (ZOCOR) 20 MG tablet Take 20 mg every evening by mouth.    warfarin (COUMADIN) 5 MG tablet Take 5 mg See admin instructions by mouth. In the evening on Mon/Wed/Fri     Allergies  Allergen Reactions   Erythromycin Nausea Only    Social History   Socioeconomic History   Marital status: Married    Spouse name: Andrey Campanile   Number of children: 2   Years of education: college   Highest education level: Not on file  Occupational History   Not on file  Tobacco Use   Smoking status: Former    Current packs/day: 0.00    Types: Cigarettes    Quit date: 12/01/1968    Years since quitting: 54.7   Smokeless tobacco: Never  Vaping Use   Vaping status: Never Used  Substance and Sexual Activity   Alcohol use: No    Comment: hx of 5-6 years ago an occasional beer or wine   Drug use: No   Sexual activity: Not on file  Other Topics Concern   Not on file  Social History Narrative   Lives with wife, Andrey Campanile   Caffeine use: very little   Right handed    Social Determinants of Health  Financial Resource Strain: Not on file  Food Insecurity: Not on file  Transportation Needs: Not on file  Physical Activity: Not on file  Stress: Not on file  Social Connections: Not on file  Intimate Partner Violence: Not on file     Review of Systems: General: negative for chills, fever, night sweats or weight changes.  Cardiovascular: negative for chest pain, dyspnea on exertion, edema, orthopnea, palpitations, paroxysmal nocturnal dyspnea or shortness of breath Dermatological: negative for rash Respiratory: negative for cough or wheezing Urologic: negative for hematuria Abdominal: negative for nausea, vomiting, diarrhea, bright red blood per rectum, melena, or hematemesis Neurologic: negative for visual changes, syncope, or dizziness All  other systems reviewed and are otherwise negative except as noted above.    Blood pressure 126/60, pulse (!) 58, height 6' (1.829 m), weight 191 lb (86.6 kg), SpO2 96%.  General appearance: alert and no distress Neck: no adenopathy, no carotid bruit, no JVD, supple, symmetrical, trachea midline, and thyroid not enlarged, symmetric, no tenderness/mass/nodules Lungs: clear to auscultation bilaterally Heart: regular rate and rhythm, S1, S2 normal, no murmur, click, rub or gallop Extremities: extremities normal, atraumatic, no cyanosis or edema Pulses: 2+ and symmetric Skin: Skin color, texture, turgor normal. No rashes or lesions Neurologic: Grossly normal  EKG EKG Interpretation Date/Time:  Wednesday August 15 2023 09:04:35 EST Ventricular Rate:  58 PR Interval:  208 QRS Duration:  92 QT Interval:  412 QTC Calculation: 404 R Axis:   -39  Text Interpretation: Sinus bradycardia with Premature supraventricular complexes Left axis deviation Anteroseptal infarct (cited on or before 15-Aug-2023) When compared with ECG of 04-Dec-2011 07:12, Premature supraventricular complexes are now Present QRS duration has decreased Serial changes of evolving Anteroseptal infarct Present Confirmed by Nanetta Batty [phone]) on 08/15/2023 9:27:16 AM    ASSESSMENT AND PLAN:   Pulmonary embolism, 9/11 after prostate surgery History of remote pulmonary embolism on Coumadin anticoagulation.  STEMI (ST elevation myocardial infarction) (HCC) History of CAD status post anterior STEMI 12/02/2011.  He was on Coumadin anticoagulation because of prior pulm embolus.  I cathed him readily revealing a left dominant system with occluded mid LAD.  I tried to percutaneously recanalize him however this was unsuccessful suggestion it was "a CTO.  He did have an anterior wall motion abnormality with an EF of 30% with anteroapical dyskinesia which ultimately improved over time to an EF of 50%.  He is completely  asymptomatic.  Cardiomyopathy, ischemic, EF 35-40% by 2D 2/26 Last 2D echo performed 09/20/2017 revealed an EF of 40 to 45%.  Essential hypertension History of essential hypertension blood pressure measured today at 126/60.  He is on carvedilol.  Hyperlipidemia History of hyperlipidemia on statin therapy with lipid profile performed 07/11/2023 revealing a total cholesterol 103, LDL 57 and HDL 31.     Runell Gess MD [facility], [facility] 08/15/2023 9:41 AM

## 2023-08-15 NOTE — Assessment & Plan Note (Signed)
History of hyperlipidemia on statin therapy with lipid profile performed 07/11/2023 revealing a total cholesterol 103, LDL 57 and HDL 31.

## 2023-08-16 DIAGNOSIS — Z7901 Long term (current) use of anticoagulants: Secondary | ICD-10-CM | POA: Diagnosis not present

## 2023-08-21 ENCOUNTER — Other Ambulatory Visit: Payer: Self-pay

## 2023-08-21 DIAGNOSIS — D45 Polycythemia vera: Secondary | ICD-10-CM

## 2023-08-22 ENCOUNTER — Inpatient Hospital Stay: Payer: Medicare Other | Admitting: Hematology

## 2023-08-22 ENCOUNTER — Inpatient Hospital Stay: Payer: Medicare Other | Attending: Hematology

## 2023-08-22 ENCOUNTER — Inpatient Hospital Stay: Payer: Medicare Other

## 2023-08-22 VITALS — BP 129/84 | HR 55 | Temp 97.3°F | Resp 18 | Wt 189.6 lb

## 2023-08-22 DIAGNOSIS — R2689 Other abnormalities of gait and mobility: Secondary | ICD-10-CM | POA: Insufficient documentation

## 2023-08-22 DIAGNOSIS — Z86711 Personal history of pulmonary embolism: Secondary | ICD-10-CM | POA: Insufficient documentation

## 2023-08-22 DIAGNOSIS — Z7952 Long term (current) use of systemic steroids: Secondary | ICD-10-CM | POA: Insufficient documentation

## 2023-08-22 DIAGNOSIS — Z8249 Family history of ischemic heart disease and other diseases of the circulatory system: Secondary | ICD-10-CM | POA: Insufficient documentation

## 2023-08-22 DIAGNOSIS — Z7964 Long term (current) use of myelosuppressive agent: Secondary | ICD-10-CM | POA: Diagnosis not present

## 2023-08-22 DIAGNOSIS — Z7901 Long term (current) use of anticoagulants: Secondary | ICD-10-CM | POA: Diagnosis not present

## 2023-08-22 DIAGNOSIS — Z87891 Personal history of nicotine dependence: Secondary | ICD-10-CM | POA: Diagnosis not present

## 2023-08-22 DIAGNOSIS — Z823 Family history of stroke: Secondary | ICD-10-CM | POA: Diagnosis not present

## 2023-08-22 DIAGNOSIS — D45 Polycythemia vera: Secondary | ICD-10-CM

## 2023-08-22 DIAGNOSIS — M109 Gout, unspecified: Secondary | ICD-10-CM | POA: Insufficient documentation

## 2023-08-22 DIAGNOSIS — Z79899 Other long term (current) drug therapy: Secondary | ICD-10-CM | POA: Insufficient documentation

## 2023-08-22 DIAGNOSIS — C61 Malignant neoplasm of prostate: Secondary | ICD-10-CM | POA: Diagnosis not present

## 2023-08-22 DIAGNOSIS — I252 Old myocardial infarction: Secondary | ICD-10-CM | POA: Diagnosis not present

## 2023-08-22 LAB — CBC WITH DIFFERENTIAL (CANCER CENTER ONLY)
Abs Immature Granulocytes: 0.32 10*3/uL — ABNORMAL HIGH (ref 0.00–0.07)
Basophils Absolute: 0.3 10*3/uL — ABNORMAL HIGH (ref 0.0–0.1)
Basophils Relative: 2 %
Eosinophils Absolute: 0.3 10*3/uL (ref 0.0–0.5)
Eosinophils Relative: 2 %
HCT: 47.5 % (ref 39.0–52.0)
Hemoglobin: 15.4 g/dL (ref 13.0–17.0)
Immature Granulocytes: 2 %
Lymphocytes Relative: 15 %
Lymphs Abs: 2.3 10*3/uL (ref 0.7–4.0)
MCH: 32.2 pg (ref 26.0–34.0)
MCHC: 32.4 g/dL (ref 30.0–36.0)
MCV: 99.4 fL (ref 80.0–100.0)
Monocytes Absolute: 1 10*3/uL (ref 0.1–1.0)
Monocytes Relative: 7 %
Neutro Abs: 11.4 10*3/uL — ABNORMAL HIGH (ref 1.7–7.7)
Neutrophils Relative %: 72 %
Platelet Count: 500 10*3/uL — ABNORMAL HIGH (ref 150–400)
RBC: 4.78 MIL/uL (ref 4.22–5.81)
RDW: 14.9 % (ref 11.5–15.5)
WBC Count: 15.6 10*3/uL — ABNORMAL HIGH (ref 4.0–10.5)
nRBC: 0 % (ref 0.0–0.2)

## 2023-08-22 LAB — CMP (CANCER CENTER ONLY)
ALT: 13 U/L (ref 0–44)
AST: 19 U/L (ref 15–41)
Albumin: 4.3 g/dL (ref 3.5–5.0)
Alkaline Phosphatase: 73 U/L (ref 38–126)
Anion gap: 2 — ABNORMAL LOW (ref 5–15)
BUN: 20 mg/dL (ref 8–23)
CO2: 31 mmol/L (ref 22–32)
Calcium: 9.4 mg/dL (ref 8.9–10.3)
Chloride: 104 mmol/L (ref 98–111)
Creatinine: 1.23 mg/dL (ref 0.61–1.24)
GFR, Estimated: 59 mL/min — ABNORMAL LOW (ref >60)
Glucose, Bld: 84 mg/dL (ref 70–99)
Potassium: 5 mmol/L (ref 3.5–5.1)
Sodium: 137 mmol/L (ref 135–145)
Total Bilirubin: 0.6 mg/dL (ref <1.2)
Total Protein: 6.9 g/dL (ref 6.5–8.1)

## 2023-08-22 NOTE — Progress Notes (Signed)
HEMATOLOGY/ONCOLOGY CLINIC NOTE  Date of Service: 08/22/2023  Patient Care Team: Renford Dills, MD as PCP - General (Internal Medicine) Runell Gess, MD as PCP - Cardiology (Cardiology)  CHIEF COMPLAINTS/PURPOSE OF CONSULTATION:  Polycythemia vera  Prior therapies   Phlebotomy to keep his hematocrit less than 45.   Hydroxyurea started in June 2019.     Current therapy:     Hydroxyurea   HISTORY OF PRESENTING ILLNESS:   Gregory Blankenship. is a wonderful 82 y.o. 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 Patient has been transferred to [place] from Dr. Clelia Croft. 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 He was also diagnosed with Prostate cancer in 2011 and was found to have a Gleason score 3+4 = 7 after prostatectomy on 06/20/2010. The final pathology showed a stage T2c disease.   Patient's current treatment includes hydroxyurea 1000 mg 3 days a week and 500mg  po daily the remaining 4 days of the week.   Patient was last seen by Dr. Clelia Croft on 10/05/2022 and he was doing well overall. He noted that he was diagnosed with gout and has been getting treated with Prednisone and Allopurinol.   Patient reports he has been doing well overall without any new medical concerns since his last visit with Dr. Clelia Croft. He reports his last phlebotomy was around 6 months ago and has been receiving phlebotomies as needed.   Patient has been taking Hydroxyurea as prescribed without any toxicities.   He denies any medication changes, exercise changes, or diet changes.   Patient notes he had an heart attack in 2013 and pulmonary embolism in the past. He is currently taking Coumadin 5 mg due to pulmonary embolism. He has been tolerating his Coumadin well without any toxicities.   He has been staying hydrated, drinking around 2 L of water everyday. He denies smoking cigarettes and consuming alcohol. He drink half a glass of  orange juice everyday.   Patient notes that his prostate cancer is stable with PSA in the normal ranges and he regularly follows-up with his Urologist.   He regularly follows-up with his Dermatologist for basal cell carcinoma. He denies any history of squamous cell carcinoma.   He denies fever, chills, night sweats, infection issues, leg or hand tingling sensation, unexpected weight loss, abdominal pain, back pain, chest pain, or leg swelling.   INTERVAL HISTORY: Gregory Blankenship. is a wonderful 82 y.o. 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   Patient was last seen by me on 05/21/2023 and was doing well overall.  Today, he reports no new concerns over the last 3 months. He has been taking 2 tablets of hydroxyurea 4 days a week and 1 tablet on remaining days. He has been tolerating this hydroxyurea dose well without any new or severe toxicities. Patient denies any stomach upsets.   Patient denies any infection issues, bleeding issues on coumadin, pain/redness in hands/feet, leg swelling, new chest pain, SOB, or stroke-like symptoms, such as new tingling, numbness, or weakness. He denies any new medication changes.   Patient reports that he stays well-hydrated, drinking at least eight 8-ounce glasses a day.   He complains of a sense of imbalance, which he believes may be due to his age. His symptoms are sometimes present when he first stands. Patient denies any falls. He reports that his orthostasis was not worse with phlebotomies.   Patient  reports no gout flares since his last visit.   He reports that he is UTD with his age-appropriate vaccines, including flu, new COVID-19, RSV, pneumonia, and shingrix.  He reports that he is planning to travel to [place] in February for 3 weeks.   He has no other new concerns.   MEDICAL HISTORY:  Past Medical History:  Diagnosis Date   Arthritis    back   Blood dyscrasia    Cancer [facility])    prostate  surgery- robotic surgery    Coronary artery disease    Hyperlipidemia    Hypertension    patient denies   Hypothyroid    Myocardial infarction [facility]) 2014   Peripheral vascular disease (HCC)    phlebitis   Polycythemia    Pulmonary embolism (HCC)    Varicose veins     SURGICAL HISTORY: Past Surgical History:  Procedure Laterality Date   CARDIAC CATHETERIZATION  2014   not able to do stent    COLONOSCOPY WITH PROPOFOL N/A 05/16/2016   Procedure: COLONOSCOPY WITH PROPOFOL;  Surgeon: Charolett Bumpers, MD;  Location: [facility];  Service: Endoscopy;  Laterality: N/A;   INGUINAL HERNIA REPAIR Right 08/21/2017   Procedure: LAPAROSCOPIC RIGHT  INGUINAL HERNIA  REPAIR WITH MESH;  Surgeon: Axel Filler, MD;  Location: [facility] OR;  Service: General;  Laterality: Right;   INSERTION OF MESH Right 08/21/2017   Procedure: INSERTION OF MESH;  Surgeon: Axel Filler, MD;  Location: [facility] OR;  Service: General;  Laterality: Right;   LEFT HEART CATHETERIZATION WITH CORONARY ANGIOGRAM N/A 12/02/2011   Procedure: LEFT HEART CATHETERIZATION WITH CORONARY ANGIOGRAM;  Surgeon: Runell Gess, MD;  Location: [facility] CATH LAB;  Service: Cardiovascular;  Laterality: N/A;   PERCUTANEOUS CORONARY INTERVENTION-BALLOON ONLY  12/02/2011   Procedure: PERCUTANEOUS CORONARY INTERVENTION-BALLOON ONLY;  Surgeon: Runell Gess, MD;  Location: [facility] CATH LAB;  Service: Cardiovascular;;  Chronic total occlusion   PROSTATE SURGERY  2011   VASCULAR SURGERY     vericose veins in legs     SOCIAL HISTORY: Social History   Socioeconomic History   Marital status: Married    Spouse name: Andrey Campanile   Number of children: 2   Years of education: college   Highest education level: Not on file  Occupational History   Not on file  Tobacco Use   Smoking status: Former    Current packs/day: 0.00    Types: Cigarettes    Quit date: 12/01/1968    Years since quitting: 54.7   Smokeless tobacco: Never  Vaping Use   Vaping status:  Never Used  Substance and Sexual Activity   Alcohol use: No    Comment: hx of 5-6 years ago an occasional beer or wine   Drug use: No   Sexual activity: Not on file  Other Topics Concern   Not on file  Social History Narrative   Lives with wife, Andrey Campanile   Caffeine use: very little   Right handed    Social Determinants of Health   Financial Resource Strain: Not on file  Food Insecurity: Not on file  Transportation Needs: Not on file  Physical Activity: Not on file  Stress: Not on file  Social Connections: Not on file  Intimate Partner Violence: Not on file    FAMILY HISTORY: Family History  Problem Relation Age of Onset   Varicose Veins Mother    Cancer Mother    Cancer Father    Coronary artery disease Father  cabg x5   Stroke Maternal Grandfather    Cancer Brother     ALLERGIES:  is allergic to erythromycin.  MEDICATIONS:  Current Outpatient Medications  Medication Sig Dispense Refill   acetaminophen (TYLENOL) 325 MG tablet Take 325 mg by mouth every 6 (six) hours as needed for mild pain.      aspirin 81 MG tablet Take 81 mg by mouth every evening.      carvedilol (COREG) 6.25 MG tablet Take 1 tablet (6.25 mg total) by mouth 2 (two) times daily. 180 tablet 3   hydroxyurea (HYDREA) 500 MG capsule Take 2 capsules (1000mg ) by mouth on Monday, Tuesday, Wednesday and Friday and 1 capsule(500mg ) by mouth on Thursday Saturday and Sunday. MAY TAKE WITH FOOD TO MINIMIZE GI SIDE EFFECTS 270 capsule 3   isosorbide mononitrate (IMDUR) 30 MG 24 hr tablet TAKE 1 TABLET(30 MG) BY MOUTH DAILY 90 tablet 3   levothyroxine (SYNTHROID, LEVOTHROID) 100 MCG tablet   5   nitroGLYCERIN (NITROSTAT) 0.4 MG SL tablet Place 1 tablet (0.4 mg total) under the tongue every 5 (five) minutes as needed for chest pain. 25 tablet 3   simvastatin (ZOCOR) 20 MG tablet Take 20 mg every evening by mouth.      warfarin (COUMADIN) 5 MG tablet Take 5 mg See admin instructions by mouth. In the evening on  Mon/Wed/Fri     No current facility-administered medications for this visit.    REVIEW OF SYSTEMS:    10 Point review of Systems was done is negative except as noted above.   PHYSICAL EXAMINATION: ECOG PERFORMANCE STATUS: 1 - Symptomatic but completely ambulatory  . Vitals:   08/22/23 1028  BP: 129/84  Pulse: (!) 55  Resp: 18  Temp: (!) 97.3 F (36.3 C)  SpO2: 99%     Filed Weights   08/22/23 1028  Weight: 189 lb 9.6 oz (86 kg)    .Body mass index is 25.71 kg/m.   GENERAL:alert, in no acute distress and comfortable SKIN: no acute rashes, no significant lesions EYES: conjunctiva are pink and non-injected, sclera anicteric OROPHARYNX: MMM, no exudates, no oropharyngeal erythema or ulceration NECK: supple, no JVD LYMPH:  no palpable lymphadenopathy in the cervical, axillary or inguinal regions LUNGS: clear to auscultation b/l with normal respiratory effort HEART: regular rate & rhythm ABDOMEN:  normoactive bowel sounds , non tender, not distended. Extremity: no pedal edema PSYCH: alert & oriented x 3 with fluent speech NEURO: no focal motor/sensory deficits   LABORATORY DATA:  .    Latest Ref Rng & Units 08/22/2023    9:42 AM 05/21/2023    9:20 AM 02/19/2023    8:54 AM  CBC  WBC 4.0 - 10.5 K/uL 15.6  12.1  15.5   Hemoglobin 13.0 - 17.0 g/dL 09.8  11.9  14.7   Hematocrit 39.0 - 52.0 % 47.5  43.8  48.9   Platelets 150 - 400 K/uL 500  519  535    .    Latest Ref Rng & Units 08/22/2023    9:42 AM 05/21/2023    9:20 AM 02/19/2023    8:54 AM  CMP  Glucose 70 - 99 mg/dL 84  71  72   BUN 8 - 23 mg/dL 20  23  22    Creatinine 0.61 - 1.24 mg/dL 8.29  5.62  1.30   Sodium 135 - 145 mmol/L 137  136  137   Potassium 3.5 - 5.1 mmol/L 5.0  5.2  4.7   Chloride  98 - 111 mmol/L 104  103  103   CO2 22 - 32 mmol/L 31  29  29    Calcium 8.9 - 10.3 mg/dL 9.4  9.2  9.3   Total Protein 6.5 - 8.1 g/dL 6.9  6.5  7.0   Total Bilirubin <1.2 mg/dL 0.6  0.6  0.7   Alkaline Phos 38  - 126 U/L 73  57  65   AST 15 - 41 U/L 19  17  20    ALT 0 - 44 U/L 13  11  11        RADIOGRAPHIC STUDIES: I have personally reviewed the radiological images as listed and agreed with the findings in the report. No results found.  ASSESSMENT & PLAN:   82 year old man with:   1.  JAK2 positive polycythemia vera diagnosed in 2013.    He continues to be on hydroxyurea without any major complications.  Complication associated with this treatment did include myelosuppression, GI toxicity, pruritus among others.  His counts remain under reasonable control without any need for phlebotomy.  Laboratory data from today did show increase in his counts including white cells and platelets which is likely reactive related to his recent gout flare.  2. Thrombosis prophylaxis: No bleeding or thrombosis noted at this time.  He continues to be on full dose anticoagulation for cardiac reasons.  3. 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  The recent rise is likely related to his gout flare and prednisone.  Overall his counts under control with the hydroxyurea dosing.  PLAN:  -Discussed lab results on 08/22/23 in detail with patient. CBC showed WBC of 15.6K, hemoglobin of 15.4, and platelets of 500K. -his platelets have previously been as high as 700K 10 months ago.  -discussed goal to keep platelets close to 400K while balancing hydroxyurea. Educated patient that platelets may also fluctuate in a reactive fashion, such as with gout flares -his hematocrit was previously 43.8% three months ago and is currently 47.5%. We discussed that there may be a role to consider phlebotomy with hematocrit goal of 45% -his hgb is not significanly high at this time.  -Patient has been tolerating his hydroxyurea well without any toxicities.  -will increase dose of hydroxyurea to two tablets 5 days a week, and one tablet on days of the weekend.   -will hold off on phlebotomy today. Will continue to monitor with labs over the next 2-2.5 months and there may be considerations of phlebotomy if needed.  -educated patient that a couple of his medications may cause orthostasis -recommend using compression socks to keep blood from pooling in the legs. Also recommend moving legs around prior to standing to prevent orthostatis  -discussed option to carry an antibiotic or antiviral if needed with his travel to [place] -will continue to monitor with labs in 3 months   FOLLOW-UP: RTC with Dr Candise Che with labs and appointment for therapeutic phlebotomy in 2 months   The total time spent in the appointment was 30 minutes* .  All of the patient's questions were answered with apparent satisfaction. The patient knows to call the clinic with any problems, questions or concerns.   Wyvonnia Lora MD MS AAHIVMS [facility] Hematology/Oncology Physician [facility]  .*Total Encounter Time as defined by the Centers for Medicare and Medicaid Services includes, in addition to the face-to-face time of a patient visit (documented in the note above) non-face-to-face time: obtaining and reviewing outside history,  ordering and reviewing medications, tests or procedures, care coordination (communications with other health care professionals or caregivers) and documentation in the medical record.    I,Mitra Faeizi,acting as a Neurosurgeon for Wyvonnia Lora, MD.,have documented all relevant documentation on the behalf of Wyvonnia Lora, MD,as directed by  Wyvonnia Lora, MD while in the presence of Wyvonnia Lora, MD.  .I have reviewed the above documentation for accuracy and completeness, and I agree with the above. Johney Maine MD

## 2023-08-23 ENCOUNTER — Telehealth: Payer: Self-pay | Admitting: Hematology

## 2023-08-23 NOTE — Telephone Encounter (Signed)
Patient is aware of scheduled appointment times/dates

## 2023-08-27 DIAGNOSIS — L570 Actinic keratosis: Secondary | ICD-10-CM | POA: Diagnosis not present

## 2023-08-27 DIAGNOSIS — Z85820 Personal history of malignant melanoma of skin: Secondary | ICD-10-CM | POA: Diagnosis not present

## 2023-08-27 DIAGNOSIS — L853 Xerosis cutis: Secondary | ICD-10-CM | POA: Diagnosis not present

## 2023-08-27 DIAGNOSIS — D485 Neoplasm of uncertain behavior of skin: Secondary | ICD-10-CM | POA: Diagnosis not present

## 2023-08-27 DIAGNOSIS — Z85828 Personal history of other malignant neoplasm of skin: Secondary | ICD-10-CM | POA: Diagnosis not present

## 2023-08-27 DIAGNOSIS — L821 Other seborrheic keratosis: Secondary | ICD-10-CM | POA: Diagnosis not present

## 2023-08-27 DIAGNOSIS — C44329 Squamous cell carcinoma of skin of other parts of face: Secondary | ICD-10-CM | POA: Diagnosis not present

## 2023-08-28 ENCOUNTER — Encounter: Payer: Self-pay | Admitting: Hematology

## 2023-09-11 ENCOUNTER — Other Ambulatory Visit: Payer: Self-pay | Admitting: Cardiovascular Disease

## 2023-09-17 DIAGNOSIS — Z7901 Long term (current) use of anticoagulants: Secondary | ICD-10-CM | POA: Diagnosis not present

## 2023-09-18 ENCOUNTER — Telehealth: Payer: Self-pay | Admitting: Cardiovascular Disease

## 2023-09-18 NOTE — Telephone Encounter (Signed)
*  STAT* If patient is at the pharmacy, call can be transferred to refill team.   1. Which medications need to be refilled? (please list name of each medication and dose if known) carvedilol (COREG) 6.25 MG tablet    2. Would you like to learn more about the convenience, safety, & potential cost savings by using the [facility]?      3. Are you open to using the [facility] (Type [facility].  ).   4. Which pharmacy/location (including street and city if local pharmacy) is medication to be sent to?[id number] - [place], [place] - [address] AT [facility] [address]    5. Do they need a 30 day or 90 day supply? 90

## 2023-10-08 ENCOUNTER — Other Ambulatory Visit: Payer: Self-pay | Admitting: Cardiovascular Disease

## 2023-10-08 DIAGNOSIS — C44329 Squamous cell carcinoma of skin of other parts of face: Secondary | ICD-10-CM | POA: Diagnosis not present

## 2023-10-16 DIAGNOSIS — Z7901 Long term (current) use of anticoagulants: Secondary | ICD-10-CM | POA: Diagnosis not present

## 2023-10-23 ENCOUNTER — Other Ambulatory Visit: Payer: Self-pay

## 2023-10-23 DIAGNOSIS — D45 Polycythemia vera: Secondary | ICD-10-CM

## 2023-10-24 ENCOUNTER — Inpatient Hospital Stay: Payer: Medicare Other | Admitting: Hematology

## 2023-10-24 ENCOUNTER — Inpatient Hospital Stay: Payer: Medicare Other

## 2023-10-24 ENCOUNTER — Inpatient Hospital Stay: Payer: Medicare Other | Attending: Hematology

## 2023-10-24 VITALS — BP 142/81 | HR 63 | Temp 98.1°F | Resp 18 | Ht 72.0 in | Wt 193.3 lb

## 2023-10-24 DIAGNOSIS — D45 Polycythemia vera: Secondary | ICD-10-CM | POA: Diagnosis not present

## 2023-10-24 DIAGNOSIS — Z79899 Other long term (current) drug therapy: Secondary | ICD-10-CM | POA: Insufficient documentation

## 2023-10-24 LAB — CMP (CANCER CENTER ONLY)
ALT: 12 U/L (ref 0–44)
AST: 20 U/L (ref 15–41)
Albumin: 4.6 g/dL (ref 3.5–5.0)
Alkaline Phosphatase: 79 U/L (ref 38–126)
Anion gap: 3 — ABNORMAL LOW (ref 5–15)
BUN: 21 mg/dL (ref 8–23)
CO2: 32 mmol/L (ref 22–32)
Calcium: 10.1 mg/dL (ref 8.9–10.3)
Chloride: 98 mmol/L (ref 98–111)
Creatinine: 1.33 mg/dL — ABNORMAL HIGH (ref 0.61–1.24)
GFR, Estimated: 54 mL/min — ABNORMAL LOW (ref >60)
Glucose, Bld: 97 mg/dL (ref 70–99)
Potassium: 5.3 mmol/L — ABNORMAL HIGH (ref 3.5–5.1)
Sodium: 133 mmol/L — ABNORMAL LOW (ref 135–145)
Total Bilirubin: 0.6 mg/dL (ref 0.0–1.2)
Total Protein: 7.5 g/dL (ref 6.5–8.1)

## 2023-10-24 LAB — CBC WITH DIFFERENTIAL (CANCER CENTER ONLY)
Abs Immature Granulocytes: 0.8 10*3/uL — ABNORMAL HIGH (ref 0.00–0.07)
Basophils Absolute: 0.4 10*3/uL — ABNORMAL HIGH (ref 0.0–0.1)
Basophils Relative: 2 %
Eosinophils Absolute: 0.4 10*3/uL (ref 0.0–0.5)
Eosinophils Relative: 2 %
HCT: 49.8 % (ref 39.0–52.0)
Hemoglobin: 16.4 g/dL (ref 13.0–17.0)
Immature Granulocytes: 4 %
Lymphocytes Relative: 13 %
Lymphs Abs: 2.7 10*3/uL (ref 0.7–4.0)
MCH: 31.8 pg (ref 26.0–34.0)
MCHC: 32.9 g/dL (ref 30.0–36.0)
MCV: 96.5 fL (ref 80.0–100.0)
Monocytes Absolute: 1.5 10*3/uL — ABNORMAL HIGH (ref 0.1–1.0)
Monocytes Relative: 7 %
Neutro Abs: 14.5 10*3/uL — ABNORMAL HIGH (ref 1.7–7.7)
Neutrophils Relative %: 72 %
Platelet Count: 538 10*3/uL — ABNORMAL HIGH (ref 150–400)
RBC: 5.16 MIL/uL (ref 4.22–5.81)
RDW: 14.6 % (ref 11.5–15.5)
WBC Count: 20.3 10*3/uL — ABNORMAL HIGH (ref 4.0–10.5)
nRBC: 0 % (ref 0.0–0.2)

## 2023-10-24 MED ORDER — HYDROXYUREA 500 MG PO CAPS: 1000.0000 mg | ORAL_CAPSULE | Freq: Every day | ORAL | 3 refills | Status: DC

## 2023-10-24 NOTE — Progress Notes (Signed)
HEMATOLOGY/ONCOLOGY CLINIC NOTE  Date of Service: 10/24/2023  Patient Care Team: Renford Dills, MD as PCP - General (Internal Medicine) Runell Gess, MD as PCP - Cardiology (Cardiology)  CHIEF COMPLAINTS/PURPOSE OF CONSULTATION:  Polycythemia vera  Prior therapies   Phlebotomy to keep his hematocrit less than 45.   Hydroxyurea started in June 2019.     Current therapy:     Hydroxyurea   HISTORY OF PRESENTING ILLNESS:   Gregory Blankenship. is a wonderful 83 y.o. 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 Patient has been transferred to [place] from Dr. Clelia Croft. 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 He was also diagnosed with Prostate cancer in 2011 and was found to have a Gleason score 3+4 = 7 after prostatectomy on 06/20/2010. The final pathology showed a stage T2c disease.   Patient's current treatment includes hydroxyurea 1000 mg 3 days a week and 500mg  po daily the remaining 4 days of the week.   Patient was last seen by Dr. Clelia Croft on 10/05/2022 and he was doing well overall. He noted that he was diagnosed with gout and has been getting treated with Prednisone and Allopurinol.   Patient reports he has been doing well overall without any new medical concerns since his last visit with Dr. Clelia Croft. He reports his last phlebotomy was around 6 months ago and has been receiving phlebotomies as needed.   Patient has been taking Hydroxyurea as prescribed without any toxicities.   He denies any medication changes, exercise changes, or diet changes.   Patient notes he had an heart attack in 2013 and pulmonary embolism in the past. He is currently taking Coumadin 5 mg due to pulmonary embolism. He has been tolerating his Coumadin well without any toxicities.   He has been staying hydrated, drinking around 2 L of water everyday. He denies smoking cigarettes and consuming alcohol. He drink half a glass of  orange juice everyday.   Patient notes that his prostate cancer is stable with PSA in the normal ranges and he regularly follows-up with his Urologist.   He regularly follows-up with his Dermatologist for basal cell carcinoma. He denies any history of squamous cell carcinoma.   He denies fever, chills, night sweats, infection issues, leg or hand tingling sensation, unexpected weight loss, abdominal pain, back pain, chest pain, or leg swelling.   INTERVAL HISTORY:  Gregory Bente. is a wonderful 83 y.o. male who is here for continued evaluation and management of Polycythemia Vera.   Patient was last seen by me on 08/22/2023 and complained of a sense of imbalance and was otherwise doing well overall.  Today, he reports that he is planning to travel to [place] in February and will return after 3 weeks.   Patient reports that Dr. Nehemiah Settle prescribed a diuretic to use as needed for leg swelling. He denies starting any other new medications.   He denies any infection issues or abdominal pain and has no leg swelling at this time. He denies any other new symptoms.   Patient has been taking Hydroxyurea as prescribed and denies any toxicity issues.   He is UTD with his age-appropriate vaccines, including flu, RSV, and COVID-19 vaccinations.   Patient reports that he generally consumes half an orange and half an apple in the mornings and notes that he does regularly consume a peanut butter and banana sandwich.   MEDICAL HISTORY:  Past Medical History:  Diagnosis Date   Arthritis    back   Blood dyscrasia    Cancer [facility])    prostate surgery- robotic surgery    Coronary artery disease    Hyperlipidemia    Hypertension    patient denies   Hypothyroid    Myocardial infarction [facility]) 2014   Peripheral vascular disease (HCC)    phlebitis   Polycythemia    Pulmonary embolism (HCC)    Varicose veins     SURGICAL HISTORY: Past Surgical History:  Procedure Laterality Date   CARDIAC  CATHETERIZATION  2014   not able to do stent    COLONOSCOPY WITH PROPOFOL N/A 05/16/2016   Procedure: COLONOSCOPY WITH PROPOFOL;  Surgeon: Charolett Bumpers, MD;  Location: [facility] ENDOSCOPY;  Service: Endoscopy;  Laterality: N/A;   INGUINAL HERNIA REPAIR Right 08/21/2017   Procedure: LAPAROSCOPIC RIGHT  INGUINAL HERNIA  REPAIR WITH MESH;  Surgeon: Axel Filler, MD;  Location: [facility] OR;  Service: General;  Laterality: Right;   INSERTION OF MESH Right 08/21/2017   Procedure: INSERTION OF MESH;  Surgeon: Axel Filler, MD;  Location: [facility] OR;  Service: General;  Laterality: Right;   LEFT HEART CATHETERIZATION WITH CORONARY ANGIOGRAM N/A 12/02/2011   Procedure: LEFT HEART CATHETERIZATION WITH CORONARY ANGIOGRAM;  Surgeon: Runell Gess, MD;  Location: [facility] CATH LAB;  Service: Cardiovascular;  Laterality: N/A;   PERCUTANEOUS CORONARY INTERVENTION-BALLOON ONLY  12/02/2011   Procedure: PERCUTANEOUS CORONARY INTERVENTION-BALLOON ONLY;  Surgeon: Runell Gess, MD;  Location: [facility] CATH LAB;  Service: Cardiovascular;;  Chronic total occlusion   PROSTATE SURGERY  2011   VASCULAR SURGERY     vericose veins in legs     SOCIAL HISTORY: Social History   Socioeconomic History   Marital status: Married    Spouse name: Andrey Campanile   Number of children: 2   Years of education: college   Highest education level: Not on file  Occupational History   Not on file  Tobacco Use   Smoking status: Former    Current packs/day: 0.00    Types: Cigarettes    Quit date: 12/01/1968    Years since quitting: 54.9   Smokeless tobacco: Never  Vaping Use   Vaping status: Never Used  Substance and Sexual Activity   Alcohol use: No    Comment: hx of 5-6 years ago an occasional beer or wine   Drug use: No   Sexual activity: Not on file  Other Topics Concern   Not on file  Social History Narrative   Lives with wife, Andrey Campanile   Caffeine use: very little   Right handed    Social Drivers of Corporate investment banker Strain:  Not on file  Food Insecurity: Not on file  Transportation Needs: Not on file  Physical Activity: Not on file  Stress: Not on file  Social Connections: Not on file  Intimate Partner Violence: Not on file    FAMILY HISTORY: Family History  Problem Relation Age of Onset   Varicose Veins Mother    Cancer Mother    Cancer Father    Coronary artery disease Father        cabg x5   Stroke Maternal Grandfather    Cancer Brother     ALLERGIES:  is allergic to erythromycin.  MEDICATIONS:  Current Outpatient Medications  Medication Sig Dispense Refill   acetaminophen (TYLENOL) 325 MG tablet Take 325 mg by mouth every 6 (six) hours as needed for mild pain.      aspirin  81 MG tablet Take 81 mg by mouth every evening.      carvedilol (COREG) 6.25 MG tablet TAKE 1 TABLET(6.25 MG) BY MOUTH TWICE DAILY 180 tablet 3   hydroxyurea (HYDREA) 500 MG capsule Take 2 capsules (1000mg ) by mouth on Monday, Tuesday, Wednesday and Friday and 1 capsule(500mg ) by mouth on Thursday Saturday and Sunday. MAY TAKE WITH FOOD TO MINIMIZE GI SIDE EFFECTS 270 capsule 3   isosorbide mononitrate (IMDUR) 30 MG 24 hr tablet TAKE 1 TABLET(30 MG) BY MOUTH DAILY 90 tablet 3   levothyroxine (SYNTHROID, LEVOTHROID) 100 MCG tablet   5   nitroGLYCERIN (NITROSTAT) 0.4 MG SL tablet Place 1 tablet (0.4 mg total) under the tongue every 5 (five) minutes as needed for chest pain. 25 tablet 3   simvastatin (ZOCOR) 20 MG tablet Take 20 mg every evening by mouth.      warfarin (COUMADIN) 5 MG tablet Take 5 mg See admin instructions by mouth. In the evening on Mon/Wed/Fri     No current facility-administered medications for this visit.    REVIEW OF SYSTEMS:    10 Point review of Systems was done is negative except as noted above.   PHYSICAL EXAMINATION: ECOG PERFORMANCE STATUS: 1 - Symptomatic but completely ambulatory .BP (!) 142/81 (BP Location: Left Arm, Patient Position: Sitting)   Pulse 63   Temp 98.1 F (36.7 C) (Tympanic)    Resp 18   Ht 6' (1.829 m)   Wt 193 lb 4.8 oz (87.7 kg)   SpO2 100%   BMI 26.22 kg/m   GENERAL:alert, in no acute distress and comfortable SKIN: no acute rashes, no significant lesions EYES: conjunctiva are pink and non-injected, sclera anicteric OROPHARYNX: MMM, no exudates, no oropharyngeal erythema or ulceration NECK: supple, no JVD LYMPH:  no palpable lymphadenopathy in the cervical, axillary or inguinal regions LUNGS: clear to auscultation b/l with normal respiratory effort HEART: regular rate & rhythm ABDOMEN:  normoactive bowel sounds , non tender, not distended. Extremity: no pedal edema PSYCH: alert & oriented x 3 with fluent speech NEURO: no focal motor/sensory deficits   LABORATORY DATA:   .    Latest Ref Rng & Units 10/24/2023    1:15 PM 08/22/2023    9:42 AM 05/21/2023    9:20 AM  CBC  WBC 4.0 - 10.5 K/uL 20.3  15.6  12.1   Hemoglobin 13.0 - 17.0 g/dL 40.9  81.1  91.4   Hematocrit 39.0 - 52.0 % 49.8  47.5  43.8   Platelets 150 - 400 K/uL 538  500  519    .    Latest Ref Rng & Units 10/24/2023    1:15 PM 08/22/2023    9:42 AM 05/21/2023    9:20 AM  CMP  Glucose 70 - 99 mg/dL 97  84  71   BUN 8 - 23 mg/dL 21  20  23    Creatinine 0.61 - 1.24 mg/dL 7.82  9.56  2.13   Sodium 135 - 145 mmol/L 133  137  136   Potassium 3.5 - 5.1 mmol/L 5.3  5.0  5.2   Chloride 98 - 111 mmol/L 98  104  103   CO2 22 - 32 mmol/L 32  31  29   Calcium 8.9 - 10.3 mg/dL 08.6  9.4  9.2   Total Protein 6.5 - 8.1 g/dL 7.5  6.9  6.5   Total Bilirubin 0.0 - 1.2 mg/dL 0.6  0.6  0.6   Alkaline Phos 38 - 126 U/L  79  73  57   AST 15 - 41 U/L 20  19  17    ALT 0 - 44 U/L 12  13  11      RADIOGRAPHIC STUDIES: I have personally reviewed the radiological images as listed and agreed with the findings in the report. No results found.  ASSESSMENT & PLAN:   83 year old man with:   1.  JAK2 positive polycythemia vera diagnosed in 2013.    He continues to be on hydroxyurea without any major  complications.  Complication associated with this treatment did include myelosuppression, GI toxicity, pruritus among others.  His counts remain under reasonable control without any need for phlebotomy.  Laboratory data from today did show increase in his counts including white cells and platelets which is likely reactive related to his recent gout flare.  2. Thrombosis prophylaxis: No bleeding or thrombosis noted at this time.  He continues to be on full dose anticoagulation for cardiac reasons.  3. 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  The recent rise is likely related to his gout flare and prednisone.  Overall his counts under control with the hydroxyurea dosing.  PLAN:  -Discussed lab results on 10/24/23 in detail with patient. CBC showed WBC of 20.3K, hemoglobin of 16.4, hematocrit 49.8, and platelets of 538K. -WBCs have increased from 15.6 two months ago to 20.3 currently -Platelets show fluctuation between 500K-538K over the last 8 months -Hematocrit is gradually increasing. His hematocrit was previously 47.5% one month ago and 49.8% currently -will proceed with phlebotomy today -CMP shows that his potassium is slightly elevated, which could be due to his betablocker to some degree. This is not a significant concern at this time and we shall continue to monitor. -advised patient to limit potassium-rich foods such as orange juice or bananas -patient has been taking hydroxyurea as prescribed with no toxicity issues -will increase hydroxyurea dose to two tablets (1000mg ) daily -recommend patient to wear compression socks, especially with traveling, staying well-hydrated, moving around every 1-2 hours, and limiting caffeine and alcohol consumption to reduce the risk of blood clots.  -discussed option to carry an antibiotic/antiviral while traveling in case he has limited access to care, which patient reports would  not be an issue. -patient shall return to clinic with labs in 6 weeks  FOLLOW-UP: RTC with Dr Candise Che with labs in 7 weeks  The total time spent in the appointment was 30 minutes* .  All of the patient's questions were answered with apparent satisfaction. The patient knows to call the clinic with any problems, questions or concerns.   Wyvonnia Lora MD MS AAHIVMS [facility] Hematology/Oncology Physician [facility]  .*Total Encounter Time as defined by the Centers for Medicare and Medicaid Services includes, in addition to the face-to-face time of a patient visit (documented in the note above) non-face-to-face time: obtaining and reviewing outside history, ordering and reviewing medications, tests or procedures, care coordination (communications with other health care professionals or caregivers) and documentation in the medical record.    I,Mitra Faeizi,acting as a Neurosurgeon for Wyvonnia Lora, MD.,have documented all relevant documentation on the behalf of Wyvonnia Lora, MD,as directed by  Wyvonnia Lora, MD while in the presence of Wyvonnia Lora, MD.  .I have reviewed the above documentation for accuracy and completeness, and I agree with the above. Johney Maine MD

## 2023-10-24 NOTE — Progress Notes (Signed)
 Gregory Blankenship. presents today for phlebotomy per MD orders. Phlebotomy procedure started at 1452 and ended at 1502. 16G phlebotomy kit to L AC with 521 grams removed. Patient observed for 30 minutes after procedure without any incident. Patient tolerated procedure well. Food and drink provided. IV needle removed intact. Ambulatory to lobby with VSS upon discharge

## 2023-10-24 NOTE — Patient Instructions (Signed)

## 2023-10-30 ENCOUNTER — Encounter: Payer: Self-pay | Admitting: Hematology

## 2023-11-02 DIAGNOSIS — N1831 Chronic kidney disease, stage 3a: Secondary | ICD-10-CM | POA: Diagnosis not present

## 2023-11-02 DIAGNOSIS — D45 Polycythemia vera: Secondary | ICD-10-CM | POA: Diagnosis not present

## 2023-11-02 DIAGNOSIS — S91001S Unspecified open wound, right ankle, sequela: Secondary | ICD-10-CM | POA: Diagnosis not present

## 2023-11-15 DIAGNOSIS — Z7901 Long term (current) use of anticoagulants: Secondary | ICD-10-CM | POA: Diagnosis not present

## 2023-12-11 ENCOUNTER — Telehealth: Payer: Self-pay | Admitting: Hematology

## 2023-12-11 NOTE — Telephone Encounter (Signed)
 Patient's spouse called and left a voicemail in 12/10/2023 stating her husband was sick and was COVID Positive, patient's spouse is aware of rescheduled appointments and they have the callback number for scheduling if needing to reschedule or cancel future appointments

## 2023-12-12 ENCOUNTER — Inpatient Hospital Stay: Payer: Medicare Other | Admitting: Hematology

## 2023-12-12 ENCOUNTER — Inpatient Hospital Stay: Payer: Medicare Other

## 2023-12-14 DIAGNOSIS — I8310 Varicose veins of unspecified lower extremity with inflammation: Secondary | ICD-10-CM | POA: Diagnosis not present

## 2023-12-14 DIAGNOSIS — U071 COVID-19: Secondary | ICD-10-CM | POA: Diagnosis not present

## 2023-12-14 DIAGNOSIS — J40 Bronchitis, not specified as acute or chronic: Secondary | ICD-10-CM | POA: Diagnosis not present

## 2023-12-17 DIAGNOSIS — R0981 Nasal congestion: Secondary | ICD-10-CM | POA: Diagnosis not present

## 2023-12-31 DIAGNOSIS — L0889 Other specified local infections of the skin and subcutaneous tissue: Secondary | ICD-10-CM | POA: Diagnosis not present

## 2023-12-31 DIAGNOSIS — L821 Other seborrheic keratosis: Secondary | ICD-10-CM | POA: Diagnosis not present

## 2023-12-31 DIAGNOSIS — Z85820 Personal history of malignant melanoma of skin: Secondary | ICD-10-CM | POA: Diagnosis not present

## 2023-12-31 DIAGNOSIS — Z85828 Personal history of other malignant neoplasm of skin: Secondary | ICD-10-CM | POA: Diagnosis not present

## 2023-12-31 DIAGNOSIS — L570 Actinic keratosis: Secondary | ICD-10-CM | POA: Diagnosis not present

## 2023-12-31 DIAGNOSIS — L812 Freckles: Secondary | ICD-10-CM | POA: Diagnosis not present

## 2023-12-31 DIAGNOSIS — L820 Inflamed seborrheic keratosis: Secondary | ICD-10-CM | POA: Diagnosis not present

## 2024-01-11 ENCOUNTER — Other Ambulatory Visit: Payer: Self-pay

## 2024-01-11 DIAGNOSIS — D45 Polycythemia vera: Secondary | ICD-10-CM

## 2024-01-14 ENCOUNTER — Inpatient Hospital Stay: Admitting: Hematology

## 2024-01-14 ENCOUNTER — Inpatient Hospital Stay: Attending: Hematology

## 2024-01-14 VITALS — BP 106/68 | HR 52 | Temp 97.6°F | Resp 17 | Wt 190.3 lb

## 2024-01-14 DIAGNOSIS — Z79899 Other long term (current) drug therapy: Secondary | ICD-10-CM | POA: Insufficient documentation

## 2024-01-14 DIAGNOSIS — D45 Polycythemia vera: Secondary | ICD-10-CM | POA: Diagnosis not present

## 2024-01-14 LAB — CMP (CANCER CENTER ONLY)
ALT: 23 U/L (ref 0–44)
AST: 19 U/L (ref 15–41)
Albumin: 4.2 g/dL (ref 3.5–5.0)
Alkaline Phosphatase: 109 U/L (ref 38–126)
Anion gap: 4 — ABNORMAL LOW (ref 5–15)
BUN: 27 mg/dL — ABNORMAL HIGH (ref 8–23)
CO2: 28 mmol/L (ref 22–32)
Calcium: 9.1 mg/dL (ref 8.9–10.3)
Chloride: 101 mmol/L (ref 98–111)
Creatinine: 1.26 mg/dL — ABNORMAL HIGH (ref 0.61–1.24)
GFR, Estimated: 58 mL/min — ABNORMAL LOW
Glucose, Bld: 85 mg/dL (ref 70–99)
Potassium: 4.9 mmol/L (ref 3.5–5.1)
Sodium: 133 mmol/L — ABNORMAL LOW (ref 135–145)
Total Bilirubin: 0.5 mg/dL (ref 0.0–1.2)
Total Protein: 6.8 g/dL (ref 6.5–8.1)

## 2024-01-14 LAB — CBC WITH DIFFERENTIAL (CANCER CENTER ONLY)
Abs Immature Granulocytes: 0.24 K/uL — ABNORMAL HIGH (ref 0.00–0.07)
Basophils Absolute: 0.1 K/uL (ref 0.0–0.1)
Basophils Relative: 1 %
Eosinophils Absolute: 0.4 K/uL (ref 0.0–0.5)
Eosinophils Relative: 3 %
HCT: 38.8 % — ABNORMAL LOW (ref 39.0–52.0)
Hemoglobin: 12.8 g/dL — ABNORMAL LOW (ref 13.0–17.0)
Immature Granulocytes: 2 %
Lymphocytes Relative: 13 %
Lymphs Abs: 1.8 K/uL (ref 0.7–4.0)
MCH: 34 pg (ref 26.0–34.0)
MCHC: 33 g/dL (ref 30.0–36.0)
MCV: 102.9 fL — ABNORMAL HIGH (ref 80.0–100.0)
Monocytes Absolute: 0.9 K/uL (ref 0.1–1.0)
Monocytes Relative: 7 %
Neutro Abs: 10.7 K/uL — ABNORMAL HIGH (ref 1.7–7.7)
Neutrophils Relative %: 74 %
Platelet Count: 639 K/uL — ABNORMAL HIGH (ref 150–400)
RBC: 3.77 MIL/uL — ABNORMAL LOW (ref 4.22–5.81)
RDW: 18.5 % — ABNORMAL HIGH (ref 11.5–15.5)
WBC Count: 14.2 K/uL — ABNORMAL HIGH (ref 4.0–10.5)
nRBC: 0 % (ref 0.0–0.2)

## 2024-01-14 NOTE — Progress Notes (Signed)
 HEMATOLOGY/ONCOLOGY CLINIC NOTE  Date of Service: 01/14/2024  Patient Care Team: Merl Star, MD as PCP - General (Internal Medicine) Avanell Leigh, MD as PCP - Cardiology (Cardiology)  CHIEF COMPLAINTS/PURPOSE OF CONSULTATION:  Polycythemia vera  Prior therapies   Phlebotomy to keep his hematocrit less than 45.   Hydroxyurea started in June 2019.     Current therapy:     Hydroxyurea   HISTORY OF PRESENTING ILLNESS:   Gregory R Cogdell Jr. is a wonderful 83 y.o. 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 Patient has been transferred to us  from Dr. Dirk Fredericks. 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 He was also diagnosed with Prostate cancer in 2011 and was found to have a Gleason score 3+4 = 7 after prostatectomy on 06/20/2010. The final pathology showed a stage T2c disease.   Patient's current treatment includes hydroxyurea 1000 mg 3 days a week and 500mg  po daily the remaining 4 days of the week.   Patient was last seen by Dr. Dirk Fredericks on 10/05/2022 and he was doing well overall. He noted that he was diagnosed with gout and has been getting treated with Prednisone and Allopurinol.   Patient reports he has been doing well overall without any new medical concerns since his last visit with Dr. Dirk Fredericks. He reports his last phlebotomy was around 6 months ago and has been receiving phlebotomies as needed.   Patient has been taking Hydroxyurea as prescribed without any toxicities.   He denies any medication changes, exercise changes, or diet changes.   Patient notes he had an heart attack in 2013 and pulmonary embolism in the past. He is currently taking Coumadin 5 mg due to pulmonary embolism. He has been tolerating his Coumadin well without any toxicities.   He has been staying hydrated, drinking around 2 L of water everyday. He denies smoking cigarettes and consuming alcohol. He drink half a glass of  orange juice everyday.   Patient notes that his prostate cancer is stable with PSA in the normal ranges and he regularly follows-up with his Urologist.   He regularly follows-up with his Dermatologist for basal cell carcinoma. He denies any history of squamous cell carcinoma.   He denies fever, chills, night sweats, infection issues, leg or hand tingling sensation, unexpected weight loss, abdominal pain, back pain, chest pain, or leg swelling.   INTERVAL HISTORY:  Gregory Blankenship. is a wonderful 83 y.o. male who is here for continued evaluation and management of Polycythemia Vera.   Patient was last seen by me on 10/24/2023 and he was doing well overall.   Patient notes he has been doing well overall since our last visit. He was recently diagnosed with COVID-19 infection after he returned from his [place] trip. He was not treated with Paxlovid. He was diagnosed with COVID-19 around Middle of March.   He denies any new infection issues, fever, chills, night sweats, unexpected weight loss, back pain, chest pain, abdominal pain, abnormal bleeding, black stools, blood in stools, hematuria, or leg swelling.   Patient notes he has small wound near his left ankle, which has not healed during this visit.   Patient has been tolerating his current Hydroxyurea 500 mg twice daily (total of 1000 mg) well without any new or severe toxicities.   MEDICAL HISTORY:  Past Medical History:  Diagnosis Date   Arthritis    back   Blood dyscrasia  Cancer [facility])    prostate surgery- robotic surgery    Coronary artery disease    Hyperlipidemia    Hypertension    patient denies   Hypothyroid    Myocardial infarction [facility]) 2014   Peripheral vascular disease (HCC)    phlebitis   Polycythemia    Pulmonary embolism (HCC)    Varicose veins     SURGICAL HISTORY: Past Surgical History:  Procedure Laterality Date   CARDIAC CATHETERIZATION  2014   not able to do stent    COLONOSCOPY WITH PROPOFOL  N/A 05/16/2016   Procedure: COLONOSCOPY WITH PROPOFOL;  Surgeon: Garrett Kallman, MD;  Location: [facility] ENDOSCOPY;  Service: Endoscopy;  Laterality: N/A;   INGUINAL HERNIA REPAIR Right 08/21/2017   Procedure: LAPAROSCOPIC RIGHT  INGUINAL HERNIA  REPAIR WITH MESH;  Surgeon: Shela Derby, MD;  Location[facility] OR;  Service: General;  Laterality: Right;   INSERTION OF MESH Right 08/21/2017   Procedure: INSERTION OF MESH;  Surgeon: Shela Derby, MD;  Location: [facility] OR;  Service: General;  Laterality: Right;   LEFT HEART CATHETERIZATION WITH CORONARY ANGIOGRAM N/A 12/02/2011   Procedure: LEFT HEART CATHETERIZATION WITH CORONARY ANGIOGRAM;  Surgeon: Avanell Leigh, MD;  Location: [facility] CATH LAB;  Service: Cardiovascular;  Laterality: N/A;   PERCUTANEOUS CORONARY INTERVENTION-BALLOON ONLY  12/02/2011   Procedure: PERCUTANEOUS CORONARY INTERVENTION-BALLOON ONLY;  Surgeon: Avanell Leigh, MD;  Location: [facility] CATH LAB;  Service: Cardiovascular;;  Chronic total occlusion   PROSTATE SURGERY  2011   VASCULAR SURGERY     vericose veins in legs     SOCIAL HISTORY: Social History   Socioeconomic History   Marital status: Married    Spouse name: Raynelle Callow   Number of children: 2   Years of education: college   Highest education level: Not on file  Occupational History   Not on file  Tobacco Use   Smoking status: Former    Current packs/day: 0.00    Types: Cigarettes    Quit date: 12/01/1968    Years since quitting: 55.1   Smokeless tobacco: Never  Vaping Use   Vaping status: Never Used  Substance and Sexual Activity   Alcohol use: No    Comment: hx of 5-6 years ago an occasional beer or wine   Drug use: No   Sexual activity: Not on file  Other Topics Concern   Not on file  Social History Narrative   Lives with wife, Raynelle Callow   Caffeine use: very little   Right handed    Social Drivers of Corporate investment banker Strain: Not on file  Food Insecurity: Not on file  Transportation Needs: Not on  file  Physical Activity: Not on file  Stress: Not on file  Social Connections: Not on file  Intimate Partner Violence: Not on file    FAMILY HISTORY: Family History  Problem Relation Age of Onset   Varicose Veins Mother    Cancer Mother    Cancer Father    Coronary artery disease Father        cabg x5   Stroke Maternal Grandfather    Cancer Brother     ALLERGIES:  is allergic to erythromycin.  MEDICATIONS:  Current Outpatient Medications  Medication Sig Dispense Refill   acetaminophen (TYLENOL) 325 MG tablet Take 325 mg by mouth every 6 (six) hours as needed for mild pain.      aspirin 81 MG tablet Take 81 mg by mouth every evening.      carvedilol (  COREG) 6.25 MG tablet TAKE 1 TABLET(6.25 MG) BY MOUTH TWICE DAILY 180 tablet 3   hydroxyurea (HYDREA) 500 MG capsule Take 2 capsules (1,000 mg total) by mouth daily. MAY TAKE WITH FOOD TO MINIMIZE GI SIDE EFFECTS 180 capsule 3   isosorbide mononitrate (IMDUR) 30 MG 24 hr tablet TAKE 1 TABLET(30 MG) BY MOUTH DAILY 90 tablet 3   levothyroxine (SYNTHROID, LEVOTHROID) 100 MCG tablet   5   nitroGLYCERIN (NITROSTAT) 0.4 MG SL tablet Place 1 tablet (0.4 mg total) under the tongue every 5 (five) minutes as needed for chest pain. 25 tablet 3   simvastatin (ZOCOR) 20 MG tablet Take 20 mg every evening by mouth.      warfarin (COUMADIN) 5 MG tablet Take 5 mg See admin instructions by mouth. In the evening on Mon/Wed/Fri     No current facility-administered medications for this visit.    REVIEW OF SYSTEMS:    10 Point review of Systems was done is negative except as noted above.   PHYSICAL EXAMINATION: ECOG PERFORMANCE STATUS: 1 - Symptomatic but completely ambulatory .BP 106/68 (BP Location: Left Arm, Patient Position: Sitting)   Pulse (!) 52   Temp 97.6 F (36.4 C) (Temporal)   Resp 17   Wt 190 lb 4.8 oz (86.3 kg)   SpO2 100%   BMI 25.81 kg/m    GENERAL:alert, in no acute distress and comfortable SKIN: no acute rashes, no  significant lesions EYES: conjunctiva are pink and non-injected, sclera anicteric OROPHARYNX: MMM, no exudates, no oropharyngeal erythema or ulceration NECK: supple, no JVD LYMPH:  no palpable lymphadenopathy in the cervical, axillary or inguinal regions LUNGS: clear to auscultation b/l with normal respiratory effort HEART: regular rate & rhythm ABDOMEN:  normoactive bowel sounds , non tender, not distended. Extremity: no pedal edema PSYCH: alert & oriented x 3 with fluent speech NEURO: no focal motor/sensory deficits   LABORATORY DATA:  .    Latest Ref Rng & Units 01/14/2024   11:21 AM 10/24/2023    1:15 PM 08/22/2023    9:42 AM  CBC  WBC 4.0 - 10.5 K/uL 14.2  20.3  15.6   Hemoglobin 13.0 - 17.0 g/dL 16.1  09.6  04.5   Hematocrit 39.0 - 52.0 % 38.8  49.8  47.5   Platelets 150 - 400 K/uL 639  538  500    .    Latest Ref Rng & Units 01/14/2024   11:21 AM 10/24/2023    1:15 PM 08/22/2023    9:42 AM  CMP  Glucose 70 - 99 mg/dL 85  97  84   BUN 8 - 23 mg/dL 27  21  20    Creatinine 0.61 - 1.24 mg/dL 4.09  8.11  9.14   Sodium 135 - 145 mmol/L 133  133  137   Potassium 3.5 - 5.1 mmol/L 4.9  5.3  5.0   Chloride 98 - 111 mmol/L 101  98  104   CO2 22 - 32 mmol/L 28  32  31   Calcium 8.9 - 10.3 mg/dL 9.1  78.2  9.4   Total Protein 6.5 - 8.1 g/dL 6.8  7.5  6.9   Total Bilirubin 0.0 - 1.2 mg/dL 0.5  0.6  0.6   Alkaline Phos 38 - 126 U/L 109  79  73   AST 15 - 41 U/L 19  20  19    ALT 0 - 44 U/L 23  12  13       RADIOGRAPHIC STUDIES: I  have personally reviewed the radiological images as listed and agreed with the findings in the report. No results found.  ASSESSMENT & PLAN:   83 year old man with:   1.  JAK2 positive polycythemia vera diagnosed in 2013.    He continues to be on hydroxyurea without any major complications.  Complication associated with this treatment did include myelosuppression, GI toxicity, pruritus among others.  His counts remain under reasonable control  without any need for phlebotomy.  Laboratory data from today did show increase in his counts including white cells and platelets which is likely reactive related to his recent gout flare.  2. Thrombosis prophylaxis: No bleeding or thrombosis noted at this time.  He continues to be on full dose anticoagulation for cardiac reasons.  3. 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  The recent rise is likely related to his gout flare and prednisone.  Overall his counts under control with the hydroxyurea dosing.  PLAN:  -Discussed lab results from today, 01/14/2024, in detail with the patient. CBC shows elevated but improved WBC of 14.2 K, slightly decreased hgb of 12.8 g/dL with hct of 11.9%, and elevated plt of 639 K.  CMP reviewed -Discussed with the patient that return visit with labs sooner in 11-months.  -phlebotomy not required.  -patient has been taking hydroxyurea as prescribed with no toxicity issues -Continue hydroxyurea  (1000 mg)2 tab oi daily. -Answered all of patient's questions.  Encouraged increased po fluid intake  FOLLOW-UP: Phone visit with Dr Salomon Cree in 4 weeks Labs 1-2 prior to phone visit  The total time spent in the appointment was 20 minutes* .  All of the patient's questions were answered with apparent satisfaction. The patient knows to call the clinic with any problems, questions or concerns.   Jacquelyn Matt MD MS AAHIVMS [facility] Hematology/Oncology Physician [facility]  .*Total Encounter Time as defined by the Centers for Medicare and Medicaid Services includes, in addition to the face-to-face time of a patient visit (documented in the note above) non-face-to-face time: obtaining and reviewing outside history, ordering and reviewing medications, tests or procedures, care coordination (communications with other health care professionals or caregivers) and documentation in the medical  record.   I,Param Shah,acting as a Neurosurgeon for Jacquelyn Matt, MD.,have documented all relevant documentation on the behalf of Jacquelyn Matt, MD,as directed by  Jacquelyn Matt, MD while in the presence of Jacquelyn Matt, MD.  .I have reviewed the above documentation for accuracy and completeness, and I agree with the above. .Louvina Cleary Kishore Makita Blow MD

## 2024-01-20 ENCOUNTER — Encounter: Payer: Self-pay | Admitting: Hematology

## 2024-02-08 ENCOUNTER — Other Ambulatory Visit: Payer: Self-pay

## 2024-02-08 DIAGNOSIS — I2699 Other pulmonary embolism without acute cor pulmonale: Secondary | ICD-10-CM

## 2024-02-08 DIAGNOSIS — D45 Polycythemia vera: Secondary | ICD-10-CM

## 2024-02-11 ENCOUNTER — Inpatient Hospital Stay: Attending: Hematology

## 2024-02-11 DIAGNOSIS — I2699 Other pulmonary embolism without acute cor pulmonale: Secondary | ICD-10-CM

## 2024-02-11 DIAGNOSIS — C61 Malignant neoplasm of prostate: Secondary | ICD-10-CM | POA: Diagnosis not present

## 2024-02-11 DIAGNOSIS — D45 Polycythemia vera: Secondary | ICD-10-CM | POA: Insufficient documentation

## 2024-02-11 DIAGNOSIS — Z7964 Long term (current) use of myelosuppressive agent: Secondary | ICD-10-CM | POA: Insufficient documentation

## 2024-02-11 DIAGNOSIS — Z7901 Long term (current) use of anticoagulants: Secondary | ICD-10-CM | POA: Insufficient documentation

## 2024-02-11 LAB — CMP (CANCER CENTER ONLY)
ALT: 12 U/L (ref 0–44)
AST: 19 U/L (ref 15–41)
Albumin: 4.4 g/dL (ref 3.5–5.0)
Alkaline Phosphatase: 77 U/L (ref 38–126)
Anion gap: 5 (ref 5–15)
BUN: 23 mg/dL (ref 8–23)
CO2: 28 mmol/L (ref 22–32)
Calcium: 9.1 mg/dL (ref 8.9–10.3)
Chloride: 102 mmol/L (ref 98–111)
Creatinine: 1.29 mg/dL — ABNORMAL HIGH (ref 0.61–1.24)
GFR, Estimated: 56 mL/min — ABNORMAL LOW (ref >60)
Glucose, Bld: 71 mg/dL (ref 70–99)
Potassium: 4.3 mmol/L (ref 3.5–5.1)
Sodium: 135 mmol/L (ref 135–145)
Total Bilirubin: 0.6 mg/dL (ref 0.0–1.2)
Total Protein: 6.8 g/dL (ref 6.5–8.1)

## 2024-02-11 LAB — CBC WITH DIFFERENTIAL (CANCER CENTER ONLY)
Abs Immature Granulocytes: 0.18 10*3/uL — ABNORMAL HIGH (ref 0.00–0.07)
Basophils Absolute: 0.1 10*3/uL (ref 0.0–0.1)
Basophils Relative: 1 %
Eosinophils Absolute: 0.2 10*3/uL (ref 0.0–0.5)
Eosinophils Relative: 2 %
HCT: 36.7 % — ABNORMAL LOW (ref 39.0–52.0)
Hemoglobin: 12.5 g/dL — ABNORMAL LOW (ref 13.0–17.0)
Immature Granulocytes: 2 %
Lymphocytes Relative: 16 %
Lymphs Abs: 1.8 10*3/uL (ref 0.7–4.0)
MCH: 35.3 pg — ABNORMAL HIGH (ref 26.0–34.0)
MCHC: 34.1 g/dL (ref 30.0–36.0)
MCV: 103.7 fL — ABNORMAL HIGH (ref 80.0–100.0)
Monocytes Absolute: 0.8 10*3/uL (ref 0.1–1.0)
Monocytes Relative: 7 %
Neutro Abs: 8.5 10*3/uL — ABNORMAL HIGH (ref 1.7–7.7)
Neutrophils Relative %: 72 %
Platelet Count: 531 10*3/uL — ABNORMAL HIGH (ref 150–400)
RBC: 3.54 MIL/uL — ABNORMAL LOW (ref 4.22–5.81)
RDW: 19.2 % — ABNORMAL HIGH (ref 11.5–15.5)
WBC Count: 11.6 10*3/uL — ABNORMAL HIGH (ref 4.0–10.5)
nRBC: 0 % (ref 0.0–0.2)

## 2024-02-13 NOTE — Progress Notes (Signed)
 HEMATOLOGY/ONCOLOGY PHONE VISIT NOTE  Date of Service: 02/15/2024  Patient Care Team: Merl Star, MD as PCP - General (Internal Medicine) Avanell Leigh, MD as PCP - Cardiology (Cardiology)  CHIEF COMPLAINTS/PURPOSE OF CONSULTATION:  Polycythemia vera  Prior therapies   Phlebotomy to keep his hematocrit less than 45.   Hydroxyurea  started in June 2019.     Current therapy:     Hydroxyurea    HISTORY OF PRESENTING ILLNESS:   Gregory R Mccreadie Jr. is a wonderful 83 y.o. 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 Patient has been transferred to us  from Dr. Dirk Fredericks. 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 He was also diagnosed with Prostate cancer in 2011 and was found to have a Gleason score 3+4 = 7 after prostatectomy on 06/20/2010. The final pathology showed a stage T2c disease.   Patient's current treatment includes hydroxyurea  1000 mg 3 days a week and 500mg  po daily the remaining 4 days of the week.   Patient was last seen by Dr. Dirk Fredericks on 10/05/2022 and he was doing well overall. He noted that he was diagnosed with gout and has been getting treated with Prednisone and Allopurinol .   Patient reports he has been doing well overall without any new medical concerns since his last visit with Dr. Dirk Fredericks. He reports his last phlebotomy was around 6 months ago and has been receiving phlebotomies as needed.   Patient has been taking Hydroxyurea  as prescribed without any toxicities.   He denies any medication changes, exercise changes, or diet changes.   Patient notes he had an heart attack in 2013 and pulmonary embolism in the past. He is currently taking Coumadin  5 mg due to pulmonary embolism. He has been tolerating his Coumadin  well without any toxicities.   He has been staying hydrated, drinking around 2 L of water everyday. He denies smoking cigarettes and consuming alcohol. He drink half a glass of  orange juice everyday.   Patient notes that his prostate cancer is stable with PSA in the normal ranges and he regularly follows-up with his Urologist.   He regularly follows-up with his Dermatologist for basal cell carcinoma. He denies any history of squamous cell carcinoma.   He denies fever, chills, night sweats, infection issues, leg or hand tingling sensation, unexpected weight loss, abdominal pain, back pain, chest pain, or leg swelling.   INTERVAL HISTORY:  Gregory Asada. is a wonderful 83 y.o. male who is here for continued evaluation and management of Polycythemia Vera.   Patient was last seen by me on 01/14/2024 and reported having a COVID-19 infection mid-March. Patient also reported small wound near his left ankle.   I connected with Gregory Blankenship. on 02/14/2024 at  8:40 AM EDT by telephone visit and verified that I am speaking with the correct person using two identifiers.   I discussed the limitations, risks, security and privacy concerns of performing an evaluation and management service by telemedicine and the availability of in-person appointments. I also discussed with the patient that there may be a patient responsible charge related to this service. The patient expressed understanding and agreed to proceed.   Other persons participating in the visit and their role in the encounter: none   Patient's location: home  Provider's location: [facility]   Chief Complaint: Polycythemia Vera    Today, he reports that he has been regularly taking 500 MG Hydroxyurea  twice daily and has  been tolerating it well without any new or major toxicity issues. He denies any infection issues, GI issues, nausea, or headaches.   He continues to take 100 MG allopurinol . He denies any increased gout flares.   Patient is taking 100 mcg levothyroxine  regularly.   Patient continues to be on coumadin  and aspirin  and denies any concerns for abnormal bleeding. He denies starting any new medications.    Patient denies taking any B vitamin supplementation or multivitamin.   The results of his recent lab workup was discussed with him in detail.   MEDICAL HISTORY:  Past Medical History:  Diagnosis Date   Arthritis    back   Blood dyscrasia    Cancer [facility])    prostate surgery- robotic surgery    Coronary artery disease    Hyperlipidemia    Hypertension    patient denies   Hypothyroid    Myocardial infarction [facility]) 2014   Peripheral vascular disease (HCC)    phlebitis   Polycythemia    Pulmonary embolism (HCC)    Varicose veins     SURGICAL HISTORY: Past Surgical History:  Procedure Laterality Date   CARDIAC CATHETERIZATION  2014   not able to do stent    COLONOSCOPY WITH PROPOFOL  N/A 05/16/2016   Procedure: COLONOSCOPY WITH PROPOFOL ;  Surgeon: Garrett Kallman, MD;  Location: [facility] ENDOSCOPY;  Service: Endoscopy;  Laterality: N/A;   INGUINAL HERNIA REPAIR Right 08/21/2017   Procedure: LAPAROSCOPIC RIGHT  INGUINAL HERNIA  REPAIR WITH MESH;  Surgeon: Shela Derby, MD;  Location: [facility] OR;  Service: General;  Laterality: Right;   INSERTION OF MESH Right 08/21/2017   Procedure: INSERTION OF MESH;  Surgeon: Shela Derby, MD;  Location: [facility] OR;  Service: General;  Laterality: Right;   LEFT HEART CATHETERIZATION WITH CORONARY ANGIOGRAM N/A 12/02/2011   Procedure: LEFT HEART CATHETERIZATION WITH CORONARY ANGIOGRAM;  Surgeon: Avanell Leigh, MD;  Location: [facility] CATH LAB;  Service: Cardiovascular;  Laterality: N/A;   PERCUTANEOUS CORONARY INTERVENTION-BALLOON ONLY  12/02/2011   Procedure: PERCUTANEOUS CORONARY INTERVENTION-BALLOON ONLY;  Surgeon: Avanell Leigh, MD;  Location: [facility] CATH LAB;  Service: Cardiovascular;;  Chronic total occlusion   PROSTATE SURGERY  2011   VASCULAR SURGERY     vericose veins in legs     SOCIAL HISTORY: Social History   Socioeconomic History   Marital status: Married    Spouse name: Gregory Blankenship   Number of children: 2   Years of education: college    Highest education level: Not on file  Occupational History   Not on file  Tobacco Use   Smoking status: Former    Current packs/day: 0.00    Types: Cigarettes    Quit date: 12/01/1968    Years since quitting: 55.2   Smokeless tobacco: Never  Vaping Use   Vaping status: Never Used  Substance and Sexual Activity   Alcohol use: No    Comment: hx of 5-6 years ago an occasional beer or wine   Drug use: No   Sexual activity: Not on file  Other Topics Concern   Not on file  Social History Narrative   Lives with wife, Gregory Blankenship   Caffeine use: very little   Right handed    Social Drivers of Corporate investment banker Strain: Not on file  Food Insecurity: Not on file  Transportation Needs: Not on file  Physical Activity: Not on file  Stress: Not on file  Social Connections: Not on file  Intimate Partner Violence: Not on  file    FAMILY HISTORY: Family History  Problem Relation Age of Onset   Varicose Veins Mother    Cancer Mother    Cancer Father    Coronary artery disease Father        cabg x5   Stroke Maternal Grandfather    Cancer Brother     ALLERGIES:  is allergic to erythromycin.  MEDICATIONS:  Current Outpatient Medications  Medication Sig Dispense Refill   acetaminophen  (TYLENOL ) 325 MG tablet Take 325 mg by mouth every 6 (six) hours as needed for mild pain.      allopurinol  (ZYLOPRIM ) 100 MG tablet Take 100 mg by mouth daily.     aspirin  81 MG tablet Take 81 mg by mouth every evening.      carvedilol  (COREG ) 6.25 MG tablet TAKE 1 TABLET(6.25 MG) BY MOUTH TWICE DAILY 180 tablet 3   furosemide (LASIX) 20 MG tablet Take 20 mg by mouth daily as needed.     hydroxyurea  (HYDREA ) 500 MG capsule Take 2 capsules (1,000 mg total) by mouth daily. MAY TAKE WITH FOOD TO MINIMIZE GI SIDE EFFECTS 180 capsule 3   isosorbide  mononitrate (IMDUR ) 30 MG 24 hr tablet TAKE 1 TABLET(30 MG) BY MOUTH DAILY 90 tablet 3   levothyroxine  (SYNTHROID , LEVOTHROID) 100 MCG tablet   5    nitroGLYCERIN  (NITROSTAT ) 0.4 MG SL tablet Place 1 tablet (0.4 mg total) under the tongue every 5 (five) minutes as needed for chest pain. 25 tablet 3   simvastatin  (ZOCOR ) 20 MG tablet Take 20 mg every evening by mouth.      warfarin (COUMADIN ) 5 MG tablet Take 5 mg See admin instructions by mouth. In the evening on Mon/Wed/Fri     No current facility-administered medications for this visit.    REVIEW OF SYSTEMS:    10 Point review of Systems was done is negative except as noted above.   PHYSICAL EXAMINATION: TELEMEDICINE VISIT ECOG PERFORMANCE STATUS: 1 - Symptomatic but completely ambulatory .There were no vitals taken for this visit.  LABORATORY DATA:  .    Latest Ref Rng & Units 02/11/2024    9:08 AM 01/14/2024   11:21 AM 10/24/2023    1:15 PM  CBC  WBC 4.0 - 10.5 K/uL 11.6  14.2  20.3   Hemoglobin 13.0 - 17.0 g/dL 16.1  09.6  04.5   Hematocrit 39.0 - 52.0 % 36.7  38.8  49.8   Platelets 150 - 400 K/uL 531  639  538    .    Latest Ref Rng & Units 02/11/2024    9:08 AM 01/14/2024   11:21 AM 10/24/2023    1:15 PM  CMP  Glucose 70 - 99 mg/dL 71  85  97   BUN 8 - 23 mg/dL 23  27  21    Creatinine 0.61 - 1.24 mg/dL 4.09  8.11  9.14   Sodium 135 - 145 mmol/L 135  133  133   Potassium 3.5 - 5.1 mmol/L 4.3  4.9  5.3   Chloride 98 - 111 mmol/L 102  101  98   CO2 22 - 32 mmol/L 28  28  32   Calcium  8.9 - 10.3 mg/dL 9.1  9.1  78.2   Total Protein 6.5 - 8.1 g/dL 6.8  6.8  7.5   Total Bilirubin 0.0 - 1.2 mg/dL 0.6  0.5  0.6   Alkaline Phos 38 - 126 U/L 77  109  79   AST 15 - 41 U/L 19  19  20   ALT 0 - 44 U/L 12  23  12       RADIOGRAPHIC STUDIES: I have personally reviewed the radiological images as listed and agreed with the findings in the report. No results found.  ASSESSMENT & PLAN:   83 year old man with:   1.  JAK2 positive polycythemia vera diagnosed in 2013.    He continues to be on hydroxyurea  without any major complications.  Complication associated with this  treatment did include myelosuppression, GI toxicity, pruritus among others.  His counts remain under reasonable control without any need for phlebotomy.  Laboratory data from today did show increase in his counts including white cells and platelets which is likely reactive related to his recent gout flare.  2. Thrombosis prophylaxis: No bleeding or thrombosis noted at this time.  He continues to be on full dose anticoagulation for cardiac reasons.  3. 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  The recent rise is likely related to his gout flare and prednisone.  Overall his counts under control with the hydroxyurea  dosing.  PLAN:  -Discussed lab results from 02/11/2024 in detail with patient. CBC showed WBC of 11.6K, hemoglobin of 12.5, and platelets of 531K. -platelets improved -his platelets were 640K one month ago, but he has returned to his baseline currently at 530K -hgb stable  -HCT 37% - no indication for phlebotomy at this time -WBCs improved -CMP stable -patient has tolerated hydroxyurea  at current dose with no new or major toxicities -given that his HCT is stable and PLT is under 600K, we will continue to balance hydroxyurea  to prevent concern for anemia. Discussed that we may adjust hydroxyurea  dose down the line if needed -continue hydroxyurea  at current dose of 500 MG two tablets (1000 MG total) daily at this time -educated patient that deficiencies in B vitamins can accentuate anemia -recommend taking vitamin B complex without iron component once daily  -patient shall return to clinic in 2 months -will plan to check thyroid  function with his next labs in 6-8 weeks to ensure there are no changes in the dynamics of his blood counts. May also check vitamin levels at that time as well.  -answered all of patient's questions in detail  FOLLOW-UP: RTC with Dr Salomon Cree with labs in 2 months  The total time spent in  the appointment was 20 minutes* .  All of the patient's questions were answered with apparent satisfaction. The patient knows to call the clinic with any problems, questions or concerns.   Jacquelyn Matt MD MS AAHIVMS [facility] Hematology/Oncology Physician [facility]  .*Total Encounter Time as defined by the Centers for Medicare and Medicaid Services includes, in addition to the face-to-face time of a patient visit (documented in the note above) non-face-to-face time: obtaining and reviewing outside history, ordering and reviewing medications, tests or procedures, care coordination (communications with other health care professionals or caregivers) and documentation in the medical record.    I,Mitra Faeizi,acting as a Neurosurgeon for Jacquelyn Matt, MD.,have documented all relevant documentation on the behalf of Jacquelyn Matt, MD,as directed by  Jacquelyn Matt, MD while in the presence of Jacquelyn Matt, MD.  .I have reviewed the above documentation for accuracy and completeness, and I agree with the above. .Talik Casique Kishore Lachrisha Ziebarth MD

## 2024-02-15 ENCOUNTER — Inpatient Hospital Stay (HOSPITAL_BASED_OUTPATIENT_CLINIC_OR_DEPARTMENT_OTHER): Admitting: Hematology

## 2024-02-15 DIAGNOSIS — Z7901 Long term (current) use of anticoagulants: Secondary | ICD-10-CM | POA: Diagnosis not present

## 2024-02-15 DIAGNOSIS — D45 Polycythemia vera: Secondary | ICD-10-CM

## 2024-02-15 DIAGNOSIS — Z7964 Long term (current) use of myelosuppressive agent: Secondary | ICD-10-CM | POA: Diagnosis not present

## 2024-02-15 DIAGNOSIS — C61 Malignant neoplasm of prostate: Secondary | ICD-10-CM | POA: Diagnosis not present

## 2024-02-18 ENCOUNTER — Telehealth: Payer: Self-pay | Admitting: Hematology

## 2024-02-18 NOTE — Telephone Encounter (Signed)
 REACHED OUT TO PATIENT REGARDING UPCOMING APPTS. COULD NOT LEAVE VOICEMAIL

## 2024-02-21 ENCOUNTER — Encounter: Payer: Self-pay | Admitting: Hematology

## 2024-02-29 DIAGNOSIS — Z7901 Long term (current) use of anticoagulants: Secondary | ICD-10-CM | POA: Diagnosis not present

## 2024-03-13 DIAGNOSIS — Z7901 Long term (current) use of anticoagulants: Secondary | ICD-10-CM | POA: Diagnosis not present

## 2024-03-13 DIAGNOSIS — R059 Cough, unspecified: Secondary | ICD-10-CM | POA: Diagnosis not present

## 2024-03-13 DIAGNOSIS — R0601 Orthopnea: Secondary | ICD-10-CM | POA: Diagnosis not present

## 2024-03-18 ENCOUNTER — Other Ambulatory Visit (HOSPITAL_COMMUNITY): Payer: Self-pay | Admitting: Internal Medicine

## 2024-03-18 DIAGNOSIS — R0601 Orthopnea: Secondary | ICD-10-CM

## 2024-03-20 ENCOUNTER — Other Ambulatory Visit: Payer: Self-pay

## 2024-03-20 MED ORDER — NITROGLYCERIN 0.4 MG SL SUBL
0.4000 mg | SUBLINGUAL_TABLET | SUBLINGUAL | 1 refills | Status: AC | PRN
Start: 2024-03-20 — End: ?

## 2024-03-21 ENCOUNTER — Ambulatory Visit (HOSPITAL_COMMUNITY)
Admission: RE | Admit: 2024-03-21 | Discharge: 2024-03-21 | Disposition: A | Discharge status: Home / Self Care | Source: Ambulatory Visit | Attending: Internal Medicine | Admitting: Internal Medicine

## 2024-03-21 DIAGNOSIS — R0601 Orthopnea: Secondary | ICD-10-CM

## 2024-03-21 LAB — ECHOCARDIOGRAM COMPLETE
Area-P 1/2: 1.92 cm2
S' Lateral: 3.5 cm

## 2024-04-09 ENCOUNTER — Inpatient Hospital Stay

## 2024-04-09 ENCOUNTER — Inpatient Hospital Stay: Attending: Hematology | Admitting: Hematology

## 2024-04-20 ENCOUNTER — Other Ambulatory Visit: Payer: Self-pay | Admitting: Hematology

## 2024-04-20 DIAGNOSIS — D45 Polycythemia vera: Secondary | ICD-10-CM

## 2024-04-21 ENCOUNTER — Encounter: Payer: Self-pay | Admitting: Hematology

## 2024-04-23 DIAGNOSIS — L821 Other seborrheic keratosis: Secondary | ICD-10-CM | POA: Diagnosis not present

## 2024-04-23 DIAGNOSIS — Z85828 Personal history of other malignant neoplasm of skin: Secondary | ICD-10-CM | POA: Diagnosis not present

## 2024-04-23 DIAGNOSIS — Z85820 Personal history of malignant melanoma of skin: Secondary | ICD-10-CM | POA: Diagnosis not present

## 2024-04-23 DIAGNOSIS — L570 Actinic keratosis: Secondary | ICD-10-CM | POA: Diagnosis not present

## 2024-04-23 DIAGNOSIS — L603 Nail dystrophy: Secondary | ICD-10-CM | POA: Diagnosis not present

## 2024-04-23 DIAGNOSIS — C44622 Squamous cell carcinoma of skin of right upper limb, including shoulder: Secondary | ICD-10-CM | POA: Diagnosis not present

## 2024-05-13 DIAGNOSIS — Z7901 Long term (current) use of anticoagulants: Secondary | ICD-10-CM | POA: Diagnosis not present

## 2024-06-11 DIAGNOSIS — Z7901 Long term (current) use of anticoagulants: Secondary | ICD-10-CM | POA: Diagnosis not present

## 2024-07-07 ENCOUNTER — Other Ambulatory Visit: Payer: Self-pay

## 2024-07-07 DIAGNOSIS — D45 Polycythemia vera: Secondary | ICD-10-CM

## 2024-07-07 MED ORDER — HYDROXYUREA 500 MG PO CAPS
1000.0000 mg | ORAL_CAPSULE | Freq: Every day | ORAL | 1 refills | Status: AC
Start: 2024-07-07 — End: ?

## 2024-07-09 ENCOUNTER — Emergency Department (HOSPITAL_COMMUNITY)

## 2024-07-09 ENCOUNTER — Other Ambulatory Visit: Payer: Self-pay

## 2024-07-09 ENCOUNTER — Emergency Department (HOSPITAL_COMMUNITY)
Admission: EM | Admit: 2024-07-09 | Discharge: 2024-07-09 | Disposition: A | Discharge status: Home / Self Care | Attending: Emergency Medicine | Admitting: Emergency Medicine

## 2024-07-09 ENCOUNTER — Encounter (HOSPITAL_COMMUNITY): Payer: Self-pay

## 2024-07-09 DIAGNOSIS — Z7982 Long term (current) use of aspirin: Secondary | ICD-10-CM | POA: Insufficient documentation

## 2024-07-09 DIAGNOSIS — R5383 Other fatigue: Secondary | ICD-10-CM | POA: Diagnosis not present

## 2024-07-09 DIAGNOSIS — R55 Syncope and collapse: Secondary | ICD-10-CM | POA: Insufficient documentation

## 2024-07-09 DIAGNOSIS — Z7901 Long term (current) use of anticoagulants: Secondary | ICD-10-CM | POA: Diagnosis not present

## 2024-07-09 DIAGNOSIS — R61 Generalized hyperhidrosis: Secondary | ICD-10-CM | POA: Diagnosis not present

## 2024-07-09 DIAGNOSIS — R42 Dizziness and giddiness: Secondary | ICD-10-CM | POA: Diagnosis not present

## 2024-07-09 DIAGNOSIS — R531 Weakness: Secondary | ICD-10-CM | POA: Diagnosis present

## 2024-07-09 LAB — CBG MONITORING, ED: Glucose-Capillary: 108 mg/dL — ABNORMAL HIGH (ref 70–99)

## 2024-07-09 LAB — CBC
HCT: 37.9 % — ABNORMAL LOW (ref 39.0–52.0)
Hemoglobin: 12.4 g/dL — ABNORMAL LOW (ref 13.0–17.0)
MCH: 37.5 pg — ABNORMAL HIGH (ref 26.0–34.0)
MCHC: 32.7 g/dL (ref 30.0–36.0)
MCV: 114.5 fL — ABNORMAL HIGH (ref 80.0–100.0)
Platelets: 431 K/uL — ABNORMAL HIGH (ref 150–400)
RBC: 3.31 MIL/uL — ABNORMAL LOW (ref 4.22–5.81)
RDW: 13.7 % (ref 11.5–15.5)
WBC: 11.9 K/uL — ABNORMAL HIGH (ref 4.0–10.5)
nRBC: 0 % (ref 0.0–0.2)

## 2024-07-09 LAB — TROPONIN I (HIGH SENSITIVITY)
Troponin I (High Sensitivity): 8 ng/L (ref <18)
Troponin I (High Sensitivity): 13 ng/L (ref <18)

## 2024-07-09 LAB — BASIC METABOLIC PANEL WITH GFR
Anion gap: 9 (ref 5–15)
BUN: 17 mg/dL (ref 8–23)
CO2: 18 mmol/L — ABNORMAL LOW (ref 22–32)
Calcium: 7.8 mg/dL — ABNORMAL LOW (ref 8.9–10.3)
Chloride: 108 mmol/L (ref 98–111)
Creatinine, Ser: 1.3 mg/dL — ABNORMAL HIGH (ref 0.61–1.24)
GFR, Estimated: 55 mL/min — ABNORMAL LOW (ref >60)
Glucose, Bld: 112 mg/dL — ABNORMAL HIGH (ref 70–99)
Potassium: 4.2 mmol/L (ref 3.5–5.1)
Sodium: 135 mmol/L (ref 135–145)

## 2024-07-09 MED ORDER — ASPIRIN 81 MG PO CHEW: 324.0000 mg | CHEWABLE_TABLET | Freq: Once | ORAL | Status: DC

## 2024-07-09 NOTE — ED Provider Notes (Signed)
 [place] EMERGENCY DEPARTMENT AT [facility] Provider Note   CSN: [id number] Arrival date & time: 07/09/24  9157     Patient presents with: Weakness   Gregory Blankenship. is a 83 y.o. male.   83 yo M with a chief complaints of not feeling well.  He said he woke up this morning and he felt kind of clammy and warm all over he got up and went to the bathroom and felt a bit worse.  Went and sat down in his den and had some orange juice and after some time began to feel better.  He thinks maybe it lasted about 30 minutes in total.   Weakness      Prior to Admission medications   Medication Sig Start Date End Date Taking? Authorizing Provider  acetaminophen  (TYLENOL ) 325 MG tablet Take 325 mg by mouth every 6 (six) hours as needed for mild pain.     [provider]  allopurinol  (ZYLOPRIM ) 100 MG tablet Take 100 mg by mouth daily. 12/10/23   [provider]  aspirin  81 MG tablet Take 81 mg by mouth every evening.     [provider]  carvedilol  (COREG ) 6.25 MG tablet TAKE 1 TABLET(6.25 MG) BY MOUTH TWICE DAILY 09/13/23   Court Dorn PARAS, MD  furosemide (LASIX) 20 MG tablet Take 20 mg by mouth daily as needed. 07/30/23   [provider]  hydroxyurea  (HYDREA ) 500 MG capsule Take 2 capsules (1,000 mg total) by mouth daily. Take 2 capsules  (1000mg  total) by mouth daily . May take with food to minimize GI side effects. 07/07/24   Onesimo Gregory Brink, MD  isosorbide  mononitrate (IMDUR ) 30 MG 24 hr tablet TAKE 1 TABLET(30 MG) BY MOUTH DAILY 10/08/23   Court Dorn PARAS, MD  levothyroxine  (SYNTHROID , LEVOTHROID) 100 MCG tablet  09/10/18   [provider]  nitroGLYCERIN  (NITROSTAT ) 0.4 MG SL tablet Place 1 tablet (0.4 mg total) under the tongue every 5 (five) minutes as needed for chest pain. 03/20/24   Court Dorn PARAS, MD  simvastatin  (ZOCOR ) 20 MG tablet Take 20 mg every evening by mouth.     [provider]  warfarin (COUMADIN ) 5 MG  tablet Take 5 mg See admin instructions by mouth. In the evening on Mon/Wed/Fri    [provider]    Allergies: Erythromycin    Review of Systems  Neurological:  Positive for weakness.    Updated Vital Signs BP 97/79   Pulse 65   Temp 97.9 F (36.6 C) (Oral)   Resp 14   Ht 6' (1.829 m)   Wt 86.2 kg   SpO2 100%   BMI 25.77 kg/m   Physical Exam Vitals and nursing note reviewed.  Constitutional:      Appearance: He is well-developed.  HENT:     Head: Normocephalic and atraumatic.  Eyes:     Pupils: Pupils are equal, round, and reactive to light.  Neck:     Vascular: No JVD.  Cardiovascular:     Rate and Rhythm: Normal rate and regular rhythm.     Heart sounds: No murmur heard.    No friction rub. No gallop.  Pulmonary:     Effort: No respiratory distress.     Breath sounds: No wheezing.  Abdominal:     General: There is no distension.     Tenderness: There is no abdominal tenderness. There is no guarding or rebound.  Musculoskeletal:  General: Normal range of motion.     Cervical back: Normal range of motion and neck supple.  Skin:    Coloration: Skin is not pale.     Findings: No rash.  Neurological:     Mental Status: He is alert and oriented to person, place, and time.  Psychiatric:        Behavior: Behavior normal.     (all labs ordered are listed, but only abnormal results are displayed) Labs Reviewed  BASIC METABOLIC PANEL WITH GFR - Abnormal; Notable for the following components:      Result Value   CO2 18 (*)    Glucose, Bld 112 (*)    Creatinine, Ser 1.30 (*)    Calcium  7.8 (*)    GFR, Estimated 55 (*)    All other components within normal limits  CBC - Abnormal; Notable for the following components:   WBC 11.9 (*)    RBC 3.31 (*)    Hemoglobin 12.4 (*)    HCT 37.9 (*)    MCV 114.5 (*)    MCH 37.5 (*)    Platelets 431 (*)    All other components within normal limits  CBG MONITORING, ED - Abnormal; Notable for the following  components:   Glucose-Capillary 108 (*)    All other components within normal limits  TROPONIN I (HIGH SENSITIVITY)  TROPONIN I (HIGH SENSITIVITY)    EKG: EKG Interpretation Date/Time:  Wednesday July 09 2024 08:46:52 EDT Ventricular Rate:  64 PR Interval:  226 QRS Duration:  97 QT Interval:  402 QTC Calculation: 415 R Axis:   11  Text Interpretation: Sinus rhythm Prolonged PR interval Anterior infarct, old No significant change since last tracing Confirmed by Emil Share [phone]) on 07/09/2024 9:28:39 AM  Radiology: [facility] Chest 1 View Result Date: 07/09/2024 CLINICAL DATA:  Fatigue, dizziness. EXAM: CHEST  1 VIEW COMPARISON:  July 11, 2023. FINDINGS: The heart size and mediastinal contours are within normal limits. Both lungs are clear. The visualized skeletal structures are unremarkable. IMPRESSION: No active disease. Electronically Signed   By: Lynwood Landy Raddle M.D.   On: 07/09/2024 09:28     Procedures   Medications Ordered in the ED  aspirin  chewable tablet 324 mg (324 mg Oral Not Given 07/09/24 0910)                                    Medical Decision Making Amount and/or Complexity of Data Reviewed Labs: ordered. Radiology: ordered.  Risk OTC drugs.   83 yo M with a chief complaints of not feeling well.  By history it sounds like the patient had a vasovagal reaction.  Did not pass out.  Got better after sitting and drinking some orange juice.  Unfortunately he tells me he had an MI that felt somewhat similar back about 12 years ago.  At that time he did have some discomfort, though he does not recall if he had chest pain or trouble breathing with it.  On my record review the patient did have chest pain going on for a couple weeks at that time.   Will obtain 2 troponins.  Blood work.  tele.  2 trop negative.  No acute anemia, no significant electrolyte abnormalities.  Continues to be asymptomatic.    Discussed results with patient and family.  Would like to go  home.  PCP follow up.   12:20 PM:  I have  discussed the diagnosis/risks/treatment options with the patient.  Evaluation and diagnostic testing in the emergency department does not suggest an emergent condition requiring admission or immediate intervention beyond what has been performed at this time.  They will follow up with PCP. We also discussed returning to the ED immediately if new or worsening sx occur. We discussed the sx which are most concerning (e.g., sudden worsening pain, fever, inability to tolerate by mouth) that necessitate immediate return. Medications administered to the patient during their visit and any new prescriptions provided to the patient are listed below.  Medications given during this visit Medications  aspirin  chewable tablet 324 mg (324 mg Oral Not Given 07/09/24 0910)     The patient appears reasonably screen and/or stabilized for discharge and I doubt any other medical condition or other [facility] requiring further screening, evaluation, or treatment in the ED at this time prior to discharge.       Final diagnoses:  Vasovagal episode    ED Discharge Orders     None          Emil Share, DO 07/09/24 1220

## 2024-07-09 NOTE — Discharge Instructions (Signed)
 Eat and drink as well as you can at least for the next couple days.  Please return for repeat event chest pain difficulty breathing headache or neck pain.

## 2024-07-09 NOTE — ED Triage Notes (Signed)
 Pt BIB EMS after waking up this morning feeling hot, clammy, weak, and dizzy. Pt does not report any chest pain or SOB. Pt received flu and covid vaccine yesterday.

## 2024-07-10 DIAGNOSIS — R55 Syncope and collapse: Secondary | ICD-10-CM | POA: Diagnosis not present

## 2024-07-14 ENCOUNTER — Other Ambulatory Visit: Payer: Self-pay

## 2024-07-14 DIAGNOSIS — D45 Polycythemia vera: Secondary | ICD-10-CM

## 2024-07-15 ENCOUNTER — Inpatient Hospital Stay: Attending: Hematology

## 2024-07-15 ENCOUNTER — Inpatient Hospital Stay: Admitting: Hematology

## 2024-07-15 VITALS — BP 130/84 | HR 56 | Temp 97.3°F | Resp 17 | Wt 201.5 lb

## 2024-07-15 DIAGNOSIS — Z7964 Long term (current) use of myelosuppressive agent: Secondary | ICD-10-CM | POA: Insufficient documentation

## 2024-07-15 DIAGNOSIS — M109 Gout, unspecified: Secondary | ICD-10-CM | POA: Insufficient documentation

## 2024-07-15 DIAGNOSIS — Z86711 Personal history of pulmonary embolism: Secondary | ICD-10-CM | POA: Insufficient documentation

## 2024-07-15 DIAGNOSIS — Z87891 Personal history of nicotine dependence: Secondary | ICD-10-CM | POA: Insufficient documentation

## 2024-07-15 DIAGNOSIS — D45 Polycythemia vera: Secondary | ICD-10-CM | POA: Diagnosis not present

## 2024-07-15 DIAGNOSIS — Z8546 Personal history of malignant neoplasm of prostate: Secondary | ICD-10-CM | POA: Insufficient documentation

## 2024-07-15 DIAGNOSIS — Z79899 Other long term (current) drug therapy: Secondary | ICD-10-CM | POA: Diagnosis not present

## 2024-07-15 DIAGNOSIS — Z7952 Long term (current) use of systemic steroids: Secondary | ICD-10-CM | POA: Diagnosis not present

## 2024-07-15 LAB — IRON AND IRON BINDING CAPACITY (CC-WL,HP ONLY)
Iron: 71 ug/dL (ref 45–182)
Saturation Ratios: 18 % (ref 17.9–39.5)
TIBC: 392 ug/dL (ref 250–450)
UIBC: 321 ug/dL (ref 117–376)

## 2024-07-15 LAB — CBC WITH DIFFERENTIAL (CANCER CENTER ONLY)
Abs Immature Granulocytes: 0.29 K/uL — ABNORMAL HIGH (ref 0.00–0.07)
Basophils Absolute: 0.2 K/uL — ABNORMAL HIGH (ref 0.0–0.1)
Basophils Relative: 1 %
Eosinophils Absolute: 0.2 K/uL (ref 0.0–0.5)
Eosinophils Relative: 2 %
HCT: 38.8 % — ABNORMAL LOW (ref 39.0–52.0)
Hemoglobin: 13.3 g/dL (ref 13.0–17.0)
Immature Granulocytes: 3 %
Lymphocytes Relative: 15 %
Lymphs Abs: 1.7 K/uL (ref 0.7–4.0)
MCH: 37.2 pg — ABNORMAL HIGH (ref 26.0–34.0)
MCHC: 34.3 g/dL (ref 30.0–36.0)
MCV: 108.4 fL — ABNORMAL HIGH (ref 80.0–100.0)
Monocytes Absolute: 0.8 K/uL (ref 0.1–1.0)
Monocytes Relative: 7 %
Neutro Abs: 8.4 K/uL — ABNORMAL HIGH (ref 1.7–7.7)
Neutrophils Relative %: 72 %
Platelet Count: 590 K/uL — ABNORMAL HIGH (ref 150–400)
RBC: 3.58 MIL/uL — ABNORMAL LOW (ref 4.22–5.81)
RDW: 13.6 % (ref 11.5–15.5)
WBC Count: 11.6 K/uL — ABNORMAL HIGH (ref 4.0–10.5)
nRBC: 0 % (ref 0.0–0.2)

## 2024-07-15 LAB — CMP (CANCER CENTER ONLY)
ALT: 20 U/L (ref 0–44)
AST: 23 U/L (ref 15–41)
Albumin: 4.6 g/dL (ref 3.5–5.0)
Alkaline Phosphatase: 74 U/L (ref 38–126)
Anion gap: 4 — ABNORMAL LOW (ref 5–15)
BUN: 26 mg/dL — ABNORMAL HIGH (ref 8–23)
CO2: 30 mmol/L (ref 22–32)
Calcium: 9.9 mg/dL (ref 8.9–10.3)
Chloride: 102 mmol/L (ref 98–111)
Creatinine: 1.35 mg/dL — ABNORMAL HIGH (ref 0.61–1.24)
GFR, Estimated: 53 mL/min — ABNORMAL LOW (ref >60)
Glucose, Bld: 91 mg/dL (ref 70–99)
Potassium: 4.8 mmol/L (ref 3.5–5.1)
Sodium: 136 mmol/L (ref 135–145)
Total Bilirubin: 0.6 mg/dL (ref 0.0–1.2)
Total Protein: 7.5 g/dL (ref 6.5–8.1)

## 2024-07-15 LAB — TSH: TSH: 5.01 u[IU]/mL — ABNORMAL HIGH (ref 0.350–4.500)

## 2024-07-15 LAB — FERRITIN: Ferritin: 58 ng/mL (ref 24–336)

## 2024-07-15 LAB — VITAMIN B12: Vitamin B-12: 2949 pg/mL — ABNORMAL HIGH (ref 180–914)

## 2024-07-15 LAB — T4, FREE: Free T4: 1.08 ng/dL (ref 0.61–1.12)

## 2024-07-15 LAB — LACTATE DEHYDROGENASE: LDH: 309 U/L — ABNORMAL HIGH (ref 98–192)

## 2024-07-15 NOTE — Progress Notes (Signed)
 HEMATOLOGY ONCOLOGY PROGRESS NOTE  Date of service: 07/15/2024  Patient Care Team: Rexanne Ingle, MD as PCP - General (Internal Medicine) Court Dorn PARAS, MD as PCP - Cardiology (Cardiology)  CHIEF COMPLAINT/PURPOSE OF CONSULTATION: Polycythemia vera   HISTORY OF PRESENTING ILLNESS: Gregory Blankenship Gregory Blankenship. is a wonderful 83 y.o. 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 Patient has been transferred to us  from Dr. Amadeo. 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 He was also diagnosed with Prostate cancer in 2011 and was found to have a Gleason score 3+4 = 7 after prostatectomy on 06/20/2010. The final pathology showed a stage T2c disease.    Patient's current treatment includes hydroxyurea  1000 mg 3 days a week and 500mg  po daily the remaining 4 days of the week.    Patient was last seen by Dr. Amadeo on 10/05/2022 and he was doing well overall. He noted that he was diagnosed with gout and has been getting treated with Prednisone and Allopurinol .    Patient reports he has been doing well overall without any new medical concerns since his last visit with Dr. Amadeo. He reports his last phlebotomy was around 6 months ago and has been receiving phlebotomies as needed.    Patient has been taking Hydroxyurea  as prescribed without any toxicities.    He denies any medication changes, exercise changes, or diet changes.    Patient notes he had an heart attack in 2013 and pulmonary embolism in the past. He is currently taking Coumadin  5 mg due to pulmonary embolism. He has been tolerating his Coumadin  well without any toxicities.    He has been staying hydrated, drinking around 2 L of water everyday. He denies smoking cigarettes and consuming alcohol. He drink half a glass of orange juice everyday.    Patient notes that his prostate cancer is stable with PSA in the normal ranges and he regularly follows-up with his Urologist.     He regularly follows-up with his Dermatologist for basal cell carcinoma. He denies any history of squamous cell carcinoma.    He denies fever, chills, night sweats, infection issues, leg or hand tingling sensation, unexpected weight loss, abdominal pain, back pain, chest pain, or leg swelling.    SUMMARY OF HEMATOLOGIC HISTORY:  Prior Therapies:   Phlebotomy to keep his hematocrit less than 45.   Hydroxyurea  started in June 2019.     Current Therapy: Hydroxyurea  1,000 mg daily.  INTERVAL HISTORY: Gregory Blankenship. is a 83 y.o. male being seen here today for continued evaluation and management of Polycythemia Vera. He is accompanied by his wife today.  he was last seen by me on 02/15/2024; at the time he did not have any concerns and was doing well.   Today, he says that he does not have any new concerns. Continues to tolerate two 500 mg tablets a day of Hydroxyurea . Denies skin rashes, nausea, headaches, recent infections, change in bowel habits, SOB, sharp pains.  Denies bleeding issues with ASA and Coumadin .  Reports he has started taking B-complex vitamins and Pantoprazole, which he is tolerating well.  Patient endorses experiencing excessive clamminess/sweating after taking COVID-19 and Influenza vaccine together last week. These symptoms were concerning due to his hx of MI, so he presented to the ED for evaluation and was subsequently cleared after some screenings. Denies previous issues when taking vaccinations independently of each other. States he is up to date on other recommended vaccines.  Notes that he sees Dermatology q4 months - no recent dermatological concerns.  REVIEW OF SYSTEMS:    10 Point review of systems of done and is negative except as noted above.  MEDICAL HISTORY Past Medical History:  Diagnosis Date   Arthritis    back   Blood dyscrasia    Cancer [facility])    prostate surgery- robotic surgery    Coronary artery disease    Hyperlipidemia     Hypertension    patient denies   Hypothyroid    Myocardial infarction [facility]) 2014   Peripheral vascular disease    phlebitis   Polycythemia    Pulmonary embolism (HCC)    Varicose veins     SURGICAL HISTORY Past Surgical History:  Procedure Laterality Date   CARDIAC CATHETERIZATION  2014   not able to do stent    COLONOSCOPY WITH PROPOFOL  N/A 05/16/2016   Procedure: COLONOSCOPY WITH PROPOFOL ;  Surgeon: Gladis MARLA Louder, MD;  Location: [facility];  Service: Endoscopy;  Laterality: N/A;   INGUINAL HERNIA REPAIR Right 08/21/2017   Procedure: LAPAROSCOPIC RIGHT  INGUINAL HERNIA  REPAIR WITH MESH;  Surgeon: Rubin Calamity, MD;  Location: [facility] OR;  Service: General;  Laterality: Right;   INSERTION OF MESH Right 08/21/2017   Procedure: INSERTION OF MESH;  Surgeon: Rubin Calamity, MD;  Location: [facility] OR;  Service: General;  Laterality: Right;   LEFT HEART CATHETERIZATION WITH CORONARY ANGIOGRAM N/A 12/02/2011   Procedure: LEFT HEART CATHETERIZATION WITH CORONARY ANGIOGRAM;  Surgeon: Dorn JINNY Lesches, MD;  Location: [facility] CATH LAB;  Service: Cardiovascular;  Laterality: N/A;   PERCUTANEOUS CORONARY INTERVENTION-BALLOON ONLY  12/02/2011   Procedure: PERCUTANEOUS CORONARY INTERVENTION-BALLOON ONLY;  Surgeon: Dorn JINNY Lesches, MD;  Location: [facility] CATH LAB;  Service: Cardiovascular;;  Chronic total occlusion   PROSTATE SURGERY  2011   VASCULAR SURGERY     vericose veins in legs     SOCIAL HISTORY Social History   Tobacco Use   Smoking status: Former    Current packs/day: 0.00    Types: Cigarettes    Quit date: 12/01/1968    Years since quitting: 55.6   Smokeless tobacco: Never  Vaping Use   Vaping status: Never Used  Substance Use Topics   Alcohol use: No    Comment: hx of 5-6 years ago an occasional beer or wine   Drug use: No    Social History   Social History Narrative   Lives with wife, Particia   Caffeine use: very little   Right handed     SOCIAL DRIVERS OF HEALTH SDOH  Screenings   Tobacco Use: Medium Risk (07/09/2024)     FAMILY HISTORY Family History  Problem Relation Age of Onset   Varicose Veins Mother    Cancer Mother    Cancer Father    Coronary artery disease Father        cabg x5   Stroke Maternal Grandfather    Cancer Brother      ALLERGIES: is allergic to erythromycin.  MEDICATIONS  Current Outpatient Medications  Medication Sig Dispense Refill   acetaminophen  (TYLENOL ) 325 MG tablet Take 325 mg by mouth every 6 (six) hours as needed for mild pain.      allopurinol  (ZYLOPRIM ) 100 MG tablet Take 100 mg by mouth daily.     aspirin  81 MG tablet Take 81 mg by mouth every evening.      carvedilol  (COREG ) 6.25 MG tablet TAKE 1 TABLET(6.25 MG) BY MOUTH TWICE DAILY 180 tablet 3  furosemide (LASIX) 20 MG tablet Take 20 mg by mouth daily as needed.     hydroxyurea  (HYDREA ) 500 MG capsule Take 2 capsules (1,000 mg total) by mouth daily. Take 2 capsules  (1000mg  total) by mouth daily . May take with food to minimize GI side effects. 180 capsule 1   isosorbide  mononitrate (IMDUR ) 30 MG 24 hr tablet TAKE 1 TABLET(30 MG) BY MOUTH DAILY 90 tablet 3   levothyroxine  (SYNTHROID , LEVOTHROID) 100 MCG tablet   5   nitroGLYCERIN  (NITROSTAT ) 0.4 MG SL tablet Place 1 tablet (0.4 mg total) under the tongue every 5 (five) minutes as needed for chest pain. 75 tablet 1   simvastatin  (ZOCOR ) 20 MG tablet Take 20 mg every evening by mouth.      warfarin (COUMADIN ) 5 MG tablet Take 5 mg See admin instructions by mouth. In the evening on Mon/Wed/Fri     No current facility-administered medications for this visit.    VITALS: Vitals:   07/15/24 1047  BP: 130/84  Pulse: (!) 56  Resp: 17  Temp: (!) 97.3 F (36.3 C)  SpO2: 98%   Filed Weights   07/15/24 1047  Weight: 201 lb 8 oz (91.4 kg)   Body mass index is 27.33 kg/m.  PHYSICAL EXAMINATION: ECOG PERFORMANCE STATUS: 1 - Symptomatic but completely ambulatory .BP 130/84 (BP Location: Left Arm)    Pulse (!) 56   Temp (!) 97.3 F (36.3 C)   Resp 17   Wt 201 lb 8 oz (91.4 kg)   SpO2 98%   BMI 27.33 kg/m  GENERAL: alert, in no acute distress and comfortable SKIN: no acute rashes, no significant lesions EYES: conjunctiva are pink and non-injected, sclera anicteric OROPHARYNX: MMM, no exudates, no oropharyngeal erythema or ulceration NECK: supple, no JVD LYMPH:  no palpable lymphadenopathy in the cervical, axillary or inguinal regions LUNGS: clear to auscultation b/l with normal respiratory effort HEART: regular rate & rhythm ABDOMEN:  normoactive bowel sounds , non tender, not distended. Extremity: no pedal edema PSYCH: alert & oriented x 3 with fluent speech NEURO: no focal motor/sensory deficits  LABORATORY DATA:   I have reviewed the data as listed     Latest Ref Rng & Units 07/15/2024    9:48 AM 07/09/2024    8:59 AM 02/11/2024    9:08 AM  CBC EXTENDED  WBC 4.0 - 10.5 K/uL 11.6  11.9  11.6   RBC 4.22 - 5.81 MIL/uL 3.58  3.31  3.54   Hemoglobin 13.0 - 17.0 g/dL 86.6  87.5  87.4   HCT 39.0 - 52.0 % 38.8  37.9  36.7   Platelets 150 - 400 K/uL 590  431  531   NEUT# 1.7 - 7.7 K/uL 8.4   8.5   Lymph# 0.7 - 4.0 K/uL 1.7   1.8    Iron Studies:  Lab Results  Component Value Date   IRON 71 07/15/2024   UIBC 321 07/15/2024   TIBC 392 07/15/2024   IRONPCTSAT 18 07/15/2024   FERRITIN 58 07/15/2024    LDH:  Lab Results  Component Value Date   LDH 309 (H) 07/15/2024   TSH & FREE T4:  Lab Results  Component Value Date   TSH 5.010 (H) 07/15/2024   FREET4 1.08 07/15/2024   VITAMIN B12: Lab Results  Component Value Date   VITAMINB12 2,949 (H) 07/15/2024      Latest Ref Rng & Units 07/15/2024    9:48 AM 07/09/2024    8:59 AM 02/11/2024  9:08 AM  CMP  Glucose 70 - 99 mg/dL 91  887  71   BUN 8 - 23 mg/dL 26  17  23    Creatinine 0.61 - 1.24 mg/dL 8.64  8.69  8.70   Sodium 135 - 145 mmol/L 136  135  135   Potassium 3.5 - 5.1 mmol/L 4.8  4.2  4.3   Chloride 98 - 111  mmol/L 102  108  102   CO2 22 - 32 mmol/L 30  18  28    Calcium  8.9 - 10.3 mg/dL 9.9  7.8  9.1   Total Protein 6.5 - 8.1 g/dL 7.5   6.8   Total Bilirubin 0.0 - 1.2 mg/dL 0.6   0.6   Alkaline Phos 38 - 126 U/L 74   77   AST 15 - 41 U/L 23   19   ALT 0 - 44 U/L 20   12      RADIOGRAPHIC STUDIES: I have personally reviewed the radiological images as listed and agreed with the findings in the report. DG Chest 1 View Result Date: 07/09/2024 CLINICAL DATA:  Fatigue, dizziness. EXAM: CHEST  1 VIEW COMPARISON:  July 11, 2023. FINDINGS: The heart size and mediastinal contours are within normal limits. Both lungs are clear. The visualized skeletal structures are unremarkable. IMPRESSION: No active disease. Electronically Signed   By: Lynwood Landy Raddle M.D.   On: 07/09/2024 09:28    ASSESSMENT & PLAN:  83 y.o. male with  1.  JAK2 positive polycythemia vera diagnosed in 2013.    He continues to be on hydroxyurea  without any major complications.  Complication associated with this treatment did include myelosuppression, GI toxicity, pruritus among others.  His counts remain under reasonable control without any need for phlebotomy.  Laboratory data from today did show increase in his counts including white cells and platelets which is likely reactive related to his recent gout flare.   2. Thrombosis prophylaxis: No bleeding or thrombosis noted at this time.  He continues to be on full dose anticoagulation for cardiac reasons.   3. 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  The recent rise is likely related to his gout flare and prednisone.  Overall his counts under control with the hydroxyurea  dosing.  PLAN: - Discussed lab results on 07/15/2024 in detail with patient: CBC stable with WBC of 11K decreased from 11.9K, Hemoglobin of 13.3 increased from 12.4, HCT 38.8%, increased from 37.9%, and PLTs of 590K increased from  431K  PLTs still at goal below 600K and HCT stable without indication for phlebotomy currently CMP with Creatinine 1.35 increased from 1.30.   Mild CKD  - Independently reviewed TSH, T4, and Vitamin B12:   TSH elevated at 5.010, Free T4 normal at 1.08, and Vitamin B12 is 2,949.   He does take Levothyroxine  100 mcg.  - Recommend wearing compression socks during travel and walk around often to avoid blood clots - Stay well hydrated, recommends low alcohol consumption to avoid dehydration on flight to [place]. - Continue Hydroxyurea  at current dose (1000 mg daily) - Recommends a low dose of ibuprofen or acetaminophen  to counter reactions to vaccines.  Additionally, recommend staggering vaccines to avoid febrile reaction. -Patient should return to the clinic in 2 months before travel  FOLLOW-UP in  2 months for labs and follow-up with Dr. Onesimo.  .The total time spent in the appointment was 30 minutes* .  All of the patient's questions  were answered with apparent satisfaction. The patient knows to call the clinic with any problems, questions or concerns.   Emaline Saran MD MS AAHIVMS [facility] Hematology/Oncology Physician [facility]  .*Total Encounter Time as defined by the Centers for Medicare and Medicaid Services includes, in addition to the face-to-face time of a patient visit (documented in the note above) non-face-to-face time: obtaining and reviewing outside history, ordering and reviewing medications, tests or procedures, care coordination (communications with other health care professionals or caregivers) and documentation in the medical record.   I,Emily Lagle,acting as a Neurosurgeon for Emaline Saran, MD.,have documented all relevant documentation on the behalf of Emaline Saran, MD,as directed by  Emaline Saran, MD while in the presence of Emaline Saran, MD.  I have reviewed the above documentation for accuracy and completeness, and I agree with the above.  Laisha Rau, MD

## 2024-07-16 ENCOUNTER — Telehealth: Payer: Self-pay | Admitting: Hematology

## 2024-07-16 NOTE — Telephone Encounter (Signed)
 I contacted Gregory Blankenship and lvm informing him of his follow up appointment scheduled for 12/10 at 9:30. I asked that he return my call if he needs to re-schedule his appointments.

## 2024-07-21 ENCOUNTER — Encounter: Payer: Self-pay | Admitting: Hematology

## 2024-08-08 DIAGNOSIS — I10 Essential (primary) hypertension: Secondary | ICD-10-CM | POA: Diagnosis not present

## 2024-08-08 DIAGNOSIS — Z0001 Encounter for general adult medical examination with abnormal findings: Secondary | ICD-10-CM | POA: Diagnosis not present

## 2024-08-08 DIAGNOSIS — E7800 Pure hypercholesterolemia, unspecified: Secondary | ICD-10-CM | POA: Diagnosis not present

## 2024-08-08 DIAGNOSIS — I83003 Varicose veins of unspecified lower extremity with ulcer of ankle: Secondary | ICD-10-CM | POA: Diagnosis not present

## 2024-08-08 DIAGNOSIS — Z1331 Encounter for screening for depression: Secondary | ICD-10-CM | POA: Diagnosis not present

## 2024-08-08 DIAGNOSIS — N1831 Chronic kidney disease, stage 3a: Secondary | ICD-10-CM | POA: Diagnosis not present

## 2024-08-08 DIAGNOSIS — I2510 Atherosclerotic heart disease of native coronary artery without angina pectoris: Secondary | ICD-10-CM | POA: Diagnosis not present

## 2024-08-08 DIAGNOSIS — D6869 Other thrombophilia: Secondary | ICD-10-CM | POA: Diagnosis not present

## 2024-08-15 ENCOUNTER — Ambulatory Visit: Attending: Cardiovascular Disease | Admitting: Cardiovascular Disease

## 2024-08-15 ENCOUNTER — Encounter: Payer: Self-pay | Admitting: Cardiovascular Disease

## 2024-08-15 VITALS — BP 120/78 | HR 54 | Ht 72.0 in | Wt 202.0 lb

## 2024-08-15 DIAGNOSIS — R0601 Orthopnea: Secondary | ICD-10-CM

## 2024-08-15 DIAGNOSIS — I7121 Aneurysm of the ascending aorta, without rupture: Secondary | ICD-10-CM

## 2024-08-15 DIAGNOSIS — I2102 ST elevation (STEMI) myocardial infarction involving left anterior descending coronary artery: Secondary | ICD-10-CM

## 2024-08-15 DIAGNOSIS — I10 Essential (primary) hypertension: Secondary | ICD-10-CM | POA: Diagnosis not present

## 2024-08-15 DIAGNOSIS — I255 Ischemic cardiomyopathy: Secondary | ICD-10-CM

## 2024-08-15 DIAGNOSIS — E782 Mixed hyperlipidemia: Secondary | ICD-10-CM

## 2024-08-15 DIAGNOSIS — I2699 Other pulmonary embolism without acute cor pulmonale: Secondary | ICD-10-CM

## 2024-08-15 NOTE — Progress Notes (Signed)
 08/15/2024 Arrie JONELLE Dessie Teddie   Aug 25, 1941  [id number]  Primary Physician Rexanne Ingle, MD Primary Cardiologist: Dorn JINNY Lesches MD FACP, [place], [place], [place]  HPI:  Gregory Blankenship. is a 83 y.o.    thin and fit-appearing, married Caucasian male, father of 2, grandfather to 5 grandchildren who I last saw in the office 08/15/2023. He has a history of an anterior ST-segment-elevation myocardial infarction December 02, 2011. He was on Coumadin  anticoagulation because of prior pulmonary embolus. I cath'd him radially revealing a left dominant system with an occluded mid LAD. I tried to percutaneously recanalize him. However, this was unsuccessful probably because this was a CTO. He did have anterior wall motion abnormalities and EF of 30% and anteroapical dyskinesia which ultimately improved over time to an EF of 50% by 2D echo in June of 2014. SABRA He participated in cardiac rehab.   He has a daughter who lives in [place] who he visits several times a year.    Since I saw him in the office a year ago he continues to do well.  He is fairly active and denies chest pain or shortness of breath.  He is planning a trip to [place] on Christmas Eve this year to visit his daughter and 4 grandchildren.  He did have a 2D echo performed 03/21/2024 revealing EF of 40 to 45% unchanged from his last echo performed in 2018.  He was seen in the ER 07/09/2024 with nonspecific complaints of just not feeling well.  In retrospect, he said that his symptoms probably were related to getting the COVID and flu vaccine shots on the same day.  Current Meds  Medication Sig   acetaminophen  (TYLENOL ) 325 MG tablet Take 325 mg by mouth every 6 (six) hours as needed for mild pain.    allopurinol  (ZYLOPRIM ) 100 MG tablet Take 100 mg by mouth daily.   aspirin  81 MG tablet Take 81 mg by mouth every evening.    carvedilol  (COREG ) 6.25 MG tablet TAKE 1 TABLET(6.25 MG) BY MOUTH TWICE DAILY   furosemide (LASIX) 20 MG tablet  Take 20 mg by mouth daily as needed.   hydroxyurea  (HYDREA ) 500 MG capsule Take 2 capsules (1,000 mg total) by mouth daily. Take 2 capsules  (1000mg  total) by mouth daily . May take with food to minimize GI side effects.   isosorbide  mononitrate (IMDUR ) 30 MG 24 hr tablet TAKE 1 TABLET(30 MG) BY MOUTH DAILY   levothyroxine  (SYNTHROID , LEVOTHROID) 100 MCG tablet    nitroGLYCERIN  (NITROSTAT ) 0.4 MG SL tablet Place 1 tablet (0.4 mg total) under the tongue every 5 (five) minutes as needed for chest pain.   simvastatin  (ZOCOR ) 20 MG tablet Take 20 mg every evening by mouth.    warfarin (COUMADIN ) 5 MG tablet Take 5 mg See admin instructions by mouth. In the evening on Mon/Wed/Fri     Allergies  Allergen Reactions   Erythromycin Nausea Only    Social History   Socioeconomic History   Marital status: Married    Spouse name: Particia   Number of children: 2   Years of education: college   Highest education level: Not on file  Occupational History   Not on file  Tobacco Use   Smoking status: Former    Current packs/day: 0.00    Types: Cigarettes    Quit date: 12/01/1968    Years since quitting: 55.7   Smokeless tobacco: Never  Vaping Use   Vaping status: Never  Used  Substance and Sexual Activity   Alcohol use: No    Comment: hx of 5-6 years ago an occasional beer or wine   Drug use: No   Sexual activity: Not on file  Other Topics Concern   Not on file  Social History Narrative   Lives with wife, Particia   Caffeine use: very little   Right handed    Social Drivers of Corporate Investment Banker Strain: Not on file  Food Insecurity: Not on file  Transportation Needs: Not on file  Physical Activity: Not on file  Stress: Not on file  Social Connections: Not on file  Intimate Partner Violence: Not on file     Review of Systems: General: negative for chills, fever, night sweats or weight changes.  Cardiovascular: negative for chest pain, dyspnea on exertion, edema, orthopnea,  palpitations, paroxysmal nocturnal dyspnea or shortness of breath Dermatological: negative for rash Respiratory: negative for cough or wheezing Urologic: negative for hematuria Abdominal: negative for nausea, vomiting, diarrhea, bright red blood per rectum, melena, or hematemesis Neurologic: negative for visual changes, syncope, or dizziness All other systems reviewed and are otherwise negative except as noted above.    Blood pressure 120/78, pulse (!) 54, height 6' (1.829 m), weight 202 lb (91.6 kg), SpO2 99%.  General appearance: alert and no distress Neck: no adenopathy, no carotid bruit, no JVD, supple, symmetrical, trachea midline, and thyroid  not enlarged, symmetric, no tenderness/mass/nodules Lungs: clear to auscultation bilaterally Heart: regular rate and rhythm, S1, S2 normal, no murmur, click, rub or gallop Extremities: extremities normal, atraumatic, no cyanosis or edema Pulses: 2+ and symmetric Skin: Skin color, texture, turgor normal. No rashes or lesions Neurologic: Grossly normal  EKG not performed today      ASSESSMENT AND PLAN:   Pulmonary embolism, 9/11 after prostate surgery Remains on Coumadin  oral anticoagulation.  STEMI (ST elevation myocardial infarction) (HCC) History of anterior STEMI 12/02/2011.  I cathed him radially revealing a left dominant system with occluded mid LAD.  I tried to recanalize this but was unsuccessful suggesting this was a CTO.  He has remained asymptomatic since.  Cardiomyopathy, ischemic, EF 35-40% by 2D 2/26 History of ischemic cardiomyopathy with an EF that has remained stable by 2D echo most recently performed 03/21/2024 of 40 to 45% without valvular abnormalities.  He is on a beta-blocker but not on an ACE/ARB.  He is asymptomatic.  Essential hypertension History of essential hypertension blood pressure measured today at 120/78.  He is on carvedilol .  Hyperlipidemia History of hyperlipidemia on statin therapy with lipid profile  performed 07/11/2023 revealing total cholesterol 103, LDL 57 and HDL of 31.  Thoracic ascending aortic aneurysm Small ascending thoracic aortic aneurysm measuring 43 mm by 2D echo 03/21/2024.  This will be repeated on an annual basis.     Dorn DOROTHA Lesches MD FACP,FACC,FAHA, [facility] 08/15/2024 8:25 AM

## 2024-08-15 NOTE — Assessment & Plan Note (Signed)
 History of hyperlipidemia on statin therapy with lipid profile performed 07/11/2023 revealing total cholesterol 103, LDL 57 and HDL of 31.

## 2024-08-15 NOTE — Patient Instructions (Signed)
 Medication Instructions:  Your physician recommends that you continue on your current medications as directed. Please refer to the Current Medication list given to you today.  *If you need a refill on your cardiac medications before your next appointment, please call your pharmacy*  Testing/Procedures: Your physician has requested that you have an echocardiogram. Echocardiography is a painless test that uses sound waves to create images of your heart. It provides your doctor with information about the size and shape of your heart and how well your heart's chambers and valves are working. This procedure takes approximately one hour. There are no restrictions for this procedure. Please do NOT wear cologne, perfume, aftershave, or lotions (deodorant is allowed). Please arrive 15 minutes prior to your appointment time.  Please note: We ask at that you not bring children with you during ultrasound (echo/ vascular) testing. Due to room size and safety concerns, children are not allowed in the ultrasound rooms during exams. Our front office staff cannot provide observation of children in our lobby area while testing is being conducted. An adult accompanying a patient to their appointment will only be allowed in the ultrasound room at the discretion of the ultrasound technician under special circumstances. We apologize for any inconvenience. **To do in June**   Follow-Up: At [facility], you and your health needs are our priority.  As part of our continuing mission to provide you with exceptional heart care, our providers are all part of one team.  This team includes your primary Cardiologist (physician) and Advanced Practice Providers or APPs (Physician Assistants and Nurse Practitioners) who all work together to provide you with the care you need, when you need it.  Your next appointment:   12 month(s)  Provider:   Dorn Lesches, MD    We recommend signing up for the patient portal called  MyChart.  Sign up information is provided on this After Visit Summary.  MyChart is used to connect with patients for Virtual Visits (Telemedicine).  Patients are able to view lab/test results, encounter notes, upcoming appointments, etc.  Non-urgent messages can be sent to your provider as well.   To learn more about what you can do with MyChart, go to [email or web address].

## 2024-08-15 NOTE — Assessment & Plan Note (Signed)
 History of ischemic cardiomyopathy with an EF that has remained stable by 2D echo most recently performed 03/21/2024 of 40 to 45% without valvular abnormalities.  He is on a beta-blocker but not on an ACE/ARB.  He is asymptomatic.

## 2024-08-15 NOTE — Assessment & Plan Note (Signed)
 History of essential hypertension blood pressure measured today at 120/78.  He is on carvedilol .

## 2024-08-15 NOTE — Assessment & Plan Note (Signed)
 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  I cathed him radially revealing a left dominant system with occluded mid LAD.  I tried to recanalize this but was unsuccessful suggesting this was a CTO.  He has remained asymptomatic since.

## 2024-08-15 NOTE — Assessment & Plan Note (Signed)
 Remains on Coumadin  oral anticoagulation.

## 2024-08-15 NOTE — Assessment & Plan Note (Signed)
 Small ascending thoracic aortic aneurysm measuring 43 mm by 2D echo 03/21/2024.  This will be repeated on an annual basis.

## 2024-08-18 DIAGNOSIS — M79604 Pain in right leg: Secondary | ICD-10-CM | POA: Diagnosis not present

## 2024-08-18 DIAGNOSIS — L97311 Non-pressure chronic ulcer of right ankle limited to breakdown of skin: Secondary | ICD-10-CM | POA: Diagnosis not present

## 2024-08-18 DIAGNOSIS — L0390 Cellulitis, unspecified: Secondary | ICD-10-CM | POA: Diagnosis not present

## 2024-08-18 DIAGNOSIS — I872 Venous insufficiency (chronic) (peripheral): Secondary | ICD-10-CM | POA: Diagnosis not present

## 2024-08-26 DIAGNOSIS — L97311 Non-pressure chronic ulcer of right ankle limited to breakdown of skin: Secondary | ICD-10-CM | POA: Diagnosis not present

## 2024-08-27 DIAGNOSIS — L812 Freckles: Secondary | ICD-10-CM | POA: Diagnosis not present

## 2024-08-27 DIAGNOSIS — C44622 Squamous cell carcinoma of skin of right upper limb, including shoulder: Secondary | ICD-10-CM | POA: Diagnosis not present

## 2024-08-27 DIAGNOSIS — Z85820 Personal history of malignant melanoma of skin: Secondary | ICD-10-CM | POA: Diagnosis not present

## 2024-08-27 DIAGNOSIS — L821 Other seborrheic keratosis: Secondary | ICD-10-CM | POA: Diagnosis not present

## 2024-08-27 DIAGNOSIS — Z85828 Personal history of other malignant neoplasm of skin: Secondary | ICD-10-CM | POA: Diagnosis not present

## 2024-08-27 DIAGNOSIS — L570 Actinic keratosis: Secondary | ICD-10-CM | POA: Diagnosis not present

## 2024-08-29 ENCOUNTER — Encounter (HOSPITAL_BASED_OUTPATIENT_CLINIC_OR_DEPARTMENT_OTHER): Attending: General Surgery | Admitting: General Surgery

## 2024-08-29 DIAGNOSIS — N1831 Chronic kidney disease, stage 3a: Secondary | ICD-10-CM | POA: Insufficient documentation

## 2024-08-29 DIAGNOSIS — Z7901 Long term (current) use of anticoagulants: Secondary | ICD-10-CM | POA: Insufficient documentation

## 2024-08-29 DIAGNOSIS — L97312 Non-pressure chronic ulcer of right ankle with fat layer exposed: Secondary | ICD-10-CM | POA: Diagnosis not present

## 2024-08-29 DIAGNOSIS — I2510 Atherosclerotic heart disease of native coronary artery without angina pectoris: Secondary | ICD-10-CM | POA: Insufficient documentation

## 2024-08-29 DIAGNOSIS — I872 Venous insufficiency (chronic) (peripheral): Secondary | ICD-10-CM | POA: Insufficient documentation

## 2024-08-29 DIAGNOSIS — I255 Ischemic cardiomyopathy: Secondary | ICD-10-CM | POA: Insufficient documentation

## 2024-09-08 DIAGNOSIS — Z7901 Long term (current) use of anticoagulants: Secondary | ICD-10-CM | POA: Diagnosis not present

## 2024-09-08 DIAGNOSIS — E039 Hypothyroidism, unspecified: Secondary | ICD-10-CM | POA: Diagnosis not present

## 2024-09-09 ENCOUNTER — Encounter (HOSPITAL_BASED_OUTPATIENT_CLINIC_OR_DEPARTMENT_OTHER): Admitting: General Surgery

## 2024-09-09 DIAGNOSIS — L97322 Non-pressure chronic ulcer of left ankle with fat layer exposed: Secondary | ICD-10-CM | POA: Diagnosis not present

## 2024-09-09 DIAGNOSIS — Z7901 Long term (current) use of anticoagulants: Secondary | ICD-10-CM | POA: Diagnosis not present

## 2024-09-09 DIAGNOSIS — I872 Venous insufficiency (chronic) (peripheral): Secondary | ICD-10-CM | POA: Insufficient documentation

## 2024-09-09 DIAGNOSIS — L97312 Non-pressure chronic ulcer of right ankle with fat layer exposed: Secondary | ICD-10-CM | POA: Diagnosis not present

## 2024-09-09 DIAGNOSIS — N1831 Chronic kidney disease, stage 3a: Secondary | ICD-10-CM | POA: Insufficient documentation

## 2024-09-15 ENCOUNTER — Encounter (HOSPITAL_BASED_OUTPATIENT_CLINIC_OR_DEPARTMENT_OTHER): Admitting: Internal Medicine

## 2024-09-15 DIAGNOSIS — L97311 Non-pressure chronic ulcer of right ankle limited to breakdown of skin: Secondary | ICD-10-CM | POA: Diagnosis not present

## 2024-09-15 DIAGNOSIS — I872 Venous insufficiency (chronic) (peripheral): Secondary | ICD-10-CM | POA: Diagnosis not present

## 2024-09-15 DIAGNOSIS — L97312 Non-pressure chronic ulcer of right ankle with fat layer exposed: Secondary | ICD-10-CM | POA: Diagnosis not present

## 2024-09-16 ENCOUNTER — Other Ambulatory Visit: Payer: Self-pay

## 2024-09-16 DIAGNOSIS — D45 Polycythemia vera: Secondary | ICD-10-CM

## 2024-09-16 DIAGNOSIS — I2699 Other pulmonary embolism without acute cor pulmonale: Secondary | ICD-10-CM

## 2024-09-17 ENCOUNTER — Ambulatory Visit: Admitting: Hematology

## 2024-09-17 ENCOUNTER — Other Ambulatory Visit: Attending: Hematology

## 2024-09-17 VITALS — BP 108/70 | HR 62 | Temp 97.3°F | Resp 20 | Wt 205.7 lb

## 2024-09-17 DIAGNOSIS — D45 Polycythemia vera: Secondary | ICD-10-CM | POA: Diagnosis not present

## 2024-09-17 DIAGNOSIS — Z79899 Other long term (current) drug therapy: Secondary | ICD-10-CM | POA: Diagnosis not present

## 2024-09-17 DIAGNOSIS — I2699 Other pulmonary embolism without acute cor pulmonale: Secondary | ICD-10-CM

## 2024-09-17 LAB — CBC WITH DIFFERENTIAL (CANCER CENTER ONLY)
Abs Immature Granulocytes: 0.2 K/uL — ABNORMAL HIGH (ref 0.00–0.07)
Basophils Absolute: 0.2 K/uL — ABNORMAL HIGH (ref 0.0–0.1)
Basophils Relative: 1 %
Eosinophils Absolute: 0.3 K/uL (ref 0.0–0.5)
Eosinophils Relative: 2 %
HCT: 36.8 % — ABNORMAL LOW (ref 39.0–52.0)
Hemoglobin: 12.4 g/dL — ABNORMAL LOW (ref 13.0–17.0)
Immature Granulocytes: 2 %
Lymphocytes Relative: 12 %
Lymphs Abs: 1.5 K/uL (ref 0.7–4.0)
MCH: 36.9 pg — ABNORMAL HIGH (ref 26.0–34.0)
MCHC: 33.7 g/dL (ref 30.0–36.0)
MCV: 109.5 fL — ABNORMAL HIGH (ref 80.0–100.0)
Monocytes Absolute: 0.6 K/uL (ref 0.1–1.0)
Monocytes Relative: 5 %
Neutro Abs: 9.9 K/uL — ABNORMAL HIGH (ref 1.7–7.7)
Neutrophils Relative %: 78 %
Platelet Count: 525 K/uL — ABNORMAL HIGH (ref 150–400)
RBC: 3.36 MIL/uL — ABNORMAL LOW (ref 4.22–5.81)
RDW: 14.8 % (ref 11.5–15.5)
WBC Count: 12.7 K/uL — ABNORMAL HIGH (ref 4.0–10.5)
nRBC: 0 % (ref 0.0–0.2)

## 2024-09-17 LAB — CMP (CANCER CENTER ONLY)
ALT: 11 U/L (ref 0–44)
AST: 26 U/L (ref 15–41)
Albumin: 4.5 g/dL (ref 3.5–5.0)
Alkaline Phosphatase: 79 U/L (ref 38–126)
Anion gap: 10 (ref 5–15)
BUN: 21 mg/dL (ref 8–23)
CO2: 25 mmol/L (ref 22–32)
Calcium: 9 mg/dL (ref 8.9–10.3)
Chloride: 101 mmol/L (ref 98–111)
Creatinine: 1.41 mg/dL — ABNORMAL HIGH (ref 0.61–1.24)
GFR, Estimated: 50 mL/min — ABNORMAL LOW (ref >60)
Glucose, Bld: 110 mg/dL — ABNORMAL HIGH (ref 70–99)
Potassium: 4.3 mmol/L (ref 3.5–5.1)
Sodium: 136 mmol/L (ref 135–145)
Total Bilirubin: 0.5 mg/dL (ref 0.0–1.2)
Total Protein: 7.1 g/dL (ref 6.5–8.1)

## 2024-09-17 LAB — IRON AND IRON BINDING CAPACITY (CC-WL,HP ONLY)
Iron: 93 ug/dL (ref 45–182)
Saturation Ratios: 26 % (ref 17.9–39.5)
TIBC: 354 ug/dL (ref 250–450)
UIBC: 261 ug/dL

## 2024-09-17 LAB — LACTATE DEHYDROGENASE: LDH: 750 U/L — ABNORMAL HIGH (ref 105–235)

## 2024-09-17 LAB — VITAMIN B12: Vitamin B-12: 3160 pg/mL — ABNORMAL HIGH (ref 180–914)

## 2024-09-17 LAB — T4, FREE: Free T4: 0.96 ng/dL (ref 0.61–1.12)

## 2024-09-17 LAB — TSH: TSH: 5.04 u[IU]/mL — ABNORMAL HIGH (ref 0.350–4.500)

## 2024-09-17 LAB — FERRITIN: Ferritin: 77 ng/mL (ref 24–336)

## 2024-09-17 NOTE — Progress Notes (Signed)
 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 is a wonderful 83 y.o. 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 Patient has been transferred to us  from Dr. Amadeo. 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 He was also diagnosed with Prostate cancer in 2011 and was found to have a Gleason score 3+4 = 7 after prostatectomy on 06/20/2010. The final pathology showed a stage T2c disease.    Patient's current treatment includes hydroxyurea  1000 mg 3 days a week and 500mg  po daily the remaining 4 days of the week.    Patient was last seen by Dr. Amadeo on 10/05/2022 and he was doing well overall. He noted that he was diagnosed with gout and has been getting treated with Prednisone and Allopurinol .    Patient reports he has been doing well overall without any new medical concerns since his last visit with Dr. Amadeo. He reports his last phlebotomy was around 6 months ago and has been receiving phlebotomies as needed.    Patient has been taking Hydroxyurea  as prescribed without any toxicities.    He denies any medication changes, exercise changes, or diet changes.    Patient notes he had an heart attack in 2013 and pulmonary embolism in the past. He is currently taking Coumadin  5 mg due to pulmonary embolism. He has been tolerating his Coumadin  well without any toxicities.    He has been staying hydrated, drinking around 2 L of water everyday. He denies smoking cigarettes and consuming alcohol. He drink half a glass of orange juice everyday.    Patient notes that his prostate cancer is stable with PSA in the normal  ranges and he regularly follows-up with his Urologist.    He regularly follows-up with his Dermatologist for basal cell carcinoma. He denies any history of squamous cell carcinoma.    He denies fever, chills, night sweats, infection issues, leg or hand tingling sensation, unexpected weight loss, abdominal pain, back pain, chest pain, or leg swelling.    SUMMARY OF HEMATOLOGIC HISTORY:  Prior Therapies:   Phlebotomy to keep his hematocrit less than 45.   Hydroxyurea  started in June 2019.     Current Therapy: Hydroxyurea  1,000 mg daily.   SUMMARY OF ONCOLOGIC HISTORY: Oncology History   No history exists.    INTERVAL HISTORY: Gregory Maue. is a 83 y.o. 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   he was last seen by me on 07/15/2024; at the time he did not have any concerns and was doing well.   Today, he notes poor circulation/venous insufficiency in his leg. He does note having open sores on his right leg that are being treated by the wound care center. He has no wounds on his left leg. There was some bloody discharge from the sores but none now.  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  He notes he is not seeing vascular surgery currently.  He was taking antibiotics for the wounds with infection initially (2-3 months ago) but is not taking  the antibiotics currently.  He is up to date on his vaccinations.  Denies back pain, abdominal pain, calf pain, and recent falls/injuries.  REVIEW OF SYSTEMS:   10 Point review of systems of done and is negative except as noted above.  MEDICAL HISTORY Past Medical History:  Diagnosis Date   Arthritis    back   Blood dyscrasia    Cancer [facility])    prostate surgery- robotic surgery    Coronary artery disease    Hyperlipidemia    Hypertension    patient denies    Hypothyroid    Myocardial infarction [facility]) 2014   Peripheral vascular disease    phlebitis   Polycythemia    Pulmonary embolism (HCC)    Varicose veins     SURGICAL HISTORY Past Surgical History:  Procedure Laterality Date   CARDIAC CATHETERIZATION  2014   not able to do stent    COLONOSCOPY WITH PROPOFOL  N/A 05/16/2016   Procedure: COLONOSCOPY WITH PROPOFOL ;  Surgeon: Gladis MARLA Louder, MD;  Location: [facility];  Service: Endoscopy;  Laterality: N/A;   INGUINAL HERNIA REPAIR Right 08/21/2017   Procedure: LAPAROSCOPIC RIGHT  INGUINAL HERNIA  REPAIR WITH MESH;  Surgeon: Rubin Calamity, MD;  Location: [facility] OR;  Service: General;  Laterality: Right;   INSERTION OF MESH Right 08/21/2017   Procedure: INSERTION OF MESH;  Surgeon: Rubin Calamity, MD;  Location: [facility] OR;  Service: General;  Laterality: Right;   LEFT HEART CATHETERIZATION WITH CORONARY ANGIOGRAM N/A 12/02/2011   Procedure: LEFT HEART CATHETERIZATION WITH CORONARY ANGIOGRAM;  Surgeon: Dorn JINNY Lesches, MD;  Location: [facility] CATH LAB;  Service: Cardiovascular;  Laterality: N/A;   PERCUTANEOUS CORONARY INTERVENTION-BALLOON ONLY  12/02/2011   Procedure: PERCUTANEOUS CORONARY INTERVENTION-BALLOON ONLY;  Surgeon: Dorn JINNY Lesches, MD;  Location: [facility] CATH LAB;  Service: Cardiovascular;;  Chronic total occlusion   PROSTATE SURGERY  2011   VASCULAR SURGERY     vericose veins in legs     SOCIAL HISTORY Social History   Tobacco Use   Smoking status: Former    Current packs/day: 0.00    Types: Cigarettes    Quit date: 12/01/1968    Years since quitting: 55.8   Smokeless tobacco: Never  Vaping Use   Vaping status: Never Used  Substance Use Topics   Alcohol use: No    Comment: hx of 5-6 years ago an occasional beer or wine   Drug use: No    Social History   Social History Narrative   Lives with wife, Particia   Caffeine use: very little   Right handed     SOCIAL DRIVERS OF HEALTH SDOH Screenings   Tobacco Use: Medium Risk  (08/15/2024)     FAMILY HISTORY Family History  Problem Relation Age of Onset   Varicose Veins Mother    Cancer Mother    Cancer Father    Coronary artery disease Father        cabg x5   Stroke Maternal Grandfather    Cancer Brother      ALLERGIES: is allergic to erythromycin.  MEDICATIONS  Current Outpatient Medications  Medication Sig Dispense Refill   acetaminophen  (TYLENOL ) 325 MG tablet Take 325 mg by mouth every 6 (six) hours as needed for mild pain.      allopurinol  (ZYLOPRIM ) 100 MG tablet Take 100 mg by mouth daily.     aspirin  81 MG tablet Take 81 mg by mouth every evening.      carvedilol  (COREG ) 6.25 MG tablet TAKE 1  TABLET(6.25 MG) BY MOUTH TWICE DAILY 180 tablet 3   furosemide (LASIX) 20 MG tablet Take 20 mg by mouth daily as needed.     hydroxyurea  (HYDREA ) 500 MG capsule Take 2 capsules (1,000 mg total) by mouth daily. Take 2 capsules  (1000mg  total) by mouth daily . May take with food to minimize GI side effects. 180 capsule 1   isosorbide  mononitrate (IMDUR ) 30 MG 24 hr tablet TAKE 1 TABLET(30 MG) BY MOUTH DAILY 90 tablet 3   levothyroxine  (SYNTHROID , LEVOTHROID) 100 MCG tablet   5   nitroGLYCERIN  (NITROSTAT ) 0.4 MG SL tablet Place 1 tablet (0.4 mg total) under the tongue every 5 (five) minutes as needed for chest pain. 75 tablet 1   simvastatin  (ZOCOR ) 20 MG tablet Take 20 mg every evening by mouth.      warfarin (COUMADIN ) 5 MG tablet Take 5 mg See admin instructions by mouth. In the evening on Mon/Wed/Fri     No current facility-administered medications for this visit.    PHYSICAL EXAMINATION: ECOG PERFORMANCE STATUS: 1 - Symptomatic but completely ambulatory VITALS: Vitals:   09/17/24 1005  BP: 108/70  Pulse: 62  Resp: 20  Temp: (!) 97.3 F (36.3 C)  SpO2: 98%   Filed Weights   09/17/24 1005  Weight: 205 lb 11.2 oz (93.3 kg)   Body mass index is 27.9 kg/m.  GENERAL: alert, in no acute distress and comfortable SKIN: no acute rashes, no  significant lesions EYES: conjunctiva are pink and non-injected, sclera anicteric OROPHARYNX: MMM, no exudates, no oropharyngeal erythema or ulceration NECK: supple, no JVD LYMPH:  no palpable lymphadenopathy in the cervical, axillary or inguinal regions LUNGS: clear to auscultation b/l with normal respiratory effort HEART: regular rate & rhythm ABDOMEN:  normoactive bowel sounds , non tender, not distended, no hepatosplenomegaly Extremity: no pedal edema PSYCH: alert & oriented x 3 with fluent speech NEURO: no focal motor/sensory deficits  LABORATORY DATA:   I have reviewed the data as listed     Latest Ref Rng & Units 07/15/2024    9:48 AM 07/09/2024    8:59 AM 02/11/2024    9:08 AM  CBC EXTENDED  WBC 4.0 - 10.5 K/uL 11.6  11.9  11.6   RBC 4.22 - 5.81 MIL/uL 3.58  3.31  3.54   Hemoglobin 13.0 - 17.0 g/dL 86.6  87.5  87.4   HCT 39.0 - 52.0 % 38.8  37.9  36.7   Platelets 150 - 400 K/uL 590  431  531   NEUT# 1.7 - 7.7 K/uL 8.4   8.5   Lymph# 0.7 - 4.0 K/uL 1.7   1.8         Latest Ref Rng & Units 07/15/2024    9:48 AM 07/09/2024    8:59 AM 02/11/2024    9:08 AM  CMP  Glucose 70 - 99 mg/dL 91  887  71   BUN 8 - 23 mg/dL 26  17  23    Creatinine 0.61 - 1.24 mg/dL 8.64  8.69  8.70   Sodium 135 - 145 mmol/L 136  135  135   Potassium 3.5 - 5.1 mmol/L 4.8  4.2  4.3   Chloride 98 - 111 mmol/L 102  108  102   CO2 22 - 32 mmol/L 30  18  28    Calcium  8.9 - 10.3 mg/dL 9.9  7.8  9.1   Total Protein 6.5 - 8.1 g/dL 7.5   6.8   Total Bilirubin 0.0 - 1.2 mg/dL  0.6   0.6   Alkaline Phos 38 - 126 U/L 74   77   AST 15 - 41 U/L 23   19   ALT 0 - 44 U/L 20   12    Lactate Dehydrogenase (LDH) Component     Latest Ref Rng 09/17/2024  LDH     105 - 235 U/L 750 (H)     Legend: (H) High  RADIOGRAPHIC STUDIES: I have personally reviewed the radiological images as listed and agreed with the findings in the report. DG Chest 1 View Result Date: 07/09/2024 CLINICAL DATA:  Fatigue, dizziness.  EXAM: CHEST  1 VIEW COMPARISON:  July 11, 2023. FINDINGS: The heart size and mediastinal contours are within normal limits. Both lungs are clear. The visualized skeletal structures are unremarkable. IMPRESSION: No active disease. Electronically Signed   By: Lynwood Landy Raddle M.D.   On: 07/09/2024 09:28    ASSESSMENT & PLAN:  83 y.o. male with  1.  JAK2 positive polycythemia vera diagnosed in 2013.    He continues to be on hydroxyurea  without any major complications.  Complication associated with this treatment did include myelosuppression, GI toxicity, pruritus among others.  His counts remain under reasonable control without any need for phlebotomy.  Laboratory data from today did show increase in his counts including white cells and platelets which is likely reactive related to his recent gout flare.   2. Thrombosis prophylaxis: No bleeding or thrombosis noted at this time.  He continues to be on full dose anticoagulation for cardiac reasons.   3. 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  The recent rise is likely related to his gout flare and prednisone.  Overall his counts under control with the hydroxyurea  dosing.   PLAN: - Discussed lab results on 09/17/2024 in detail with patient: - CBC is stable is 12.4 with a hematocrit of 36.8.  WBC counts are elevated at 12.7k and platelets are 525k (at his goal of less than 600k) -Patient's LDH level is elevated at 750.  Unclear etiology.  No overt hemolysis with stable hemoglobin levels and normal bilirubin. No significant drop in blood counts to suggest significant myelofibrosis though this remains a concern.  Be from some tissue injury related to his venous ulcers. - Discussed repeating labs in 6 weeks and if his LDH remains significantly elevated might need consideration of repeat bone marrow biopsy to evaluate for secondary myelofibrosis. -Patient has no notable  intolerance/adverse effects to his hydroxyurea . -Will continue current dose of hydroxyurea  at 1000 mg p.o. daily. - CMP noted .creatinine is 1.4 - B12, iron, TSH pending - Continue with wound care wound care clinic - Continue wearing compression socks. Discuss with PCP regarding possible need to reestablish follow-up with vascular surgery to address his venous varicosities and possible consideration for evaluation of venous reflux.  FOLLOW-UP  Return to clinic with Dr. Onesimo with labs in 6 weeks  The total time spent in the appointment was 30 minutes* .  All of the patient's questions were answered and the patient knows to call the clinic with any problems, questions, or concerns.  Gregory Onesimo MD MS AAHIVMS [facility] Hematology/Oncology Physician [facility]  *Total Encounter Time as defined by the Centers for Medicare and Medicaid Services includes, in addition to the face-to-face time of a patient visit (documented in the note above) non-face-to-face time: obtaining and reviewing outside history, ordering and reviewing medications, tests or procedures, care coordination (  communications with other health care professionals or caregivers) and documentation in the medical record.  I, Gregory Blankenship, acting as a neurosurgeon for Gregory Saran, MD.,have documented all relevant documentation on the behalf of Gregory Saran, MD,as directed by  Gregory Saran, MD while in the presence of Gregory Saran, MD.  I have reviewed the above documentation for accuracy and completeness, and I agree with the above.  Gregory Novitsky, MD

## 2024-09-23 ENCOUNTER — Encounter (HOSPITAL_BASED_OUTPATIENT_CLINIC_OR_DEPARTMENT_OTHER): Admitting: General Surgery

## 2024-09-23 DIAGNOSIS — L97312 Non-pressure chronic ulcer of right ankle with fat layer exposed: Secondary | ICD-10-CM | POA: Diagnosis not present

## 2024-09-23 DIAGNOSIS — I872 Venous insufficiency (chronic) (peripheral): Secondary | ICD-10-CM | POA: Diagnosis not present

## 2024-09-29 ENCOUNTER — Encounter (HOSPITAL_BASED_OUTPATIENT_CLINIC_OR_DEPARTMENT_OTHER): Admitting: General Surgery

## 2024-09-29 DIAGNOSIS — L97312 Non-pressure chronic ulcer of right ankle with fat layer exposed: Secondary | ICD-10-CM | POA: Diagnosis not present

## 2024-10-06 ENCOUNTER — Encounter (HOSPITAL_BASED_OUTPATIENT_CLINIC_OR_DEPARTMENT_OTHER): Admitting: General Surgery

## 2024-10-06 DIAGNOSIS — L97312 Non-pressure chronic ulcer of right ankle with fat layer exposed: Secondary | ICD-10-CM | POA: Diagnosis not present

## 2024-10-16 ENCOUNTER — Other Ambulatory Visit: Payer: Self-pay | Admitting: Cardiovascular Disease

## 2024-10-28 ENCOUNTER — Other Ambulatory Visit: Payer: Self-pay

## 2024-10-28 DIAGNOSIS — D45 Polycythemia vera: Secondary | ICD-10-CM

## 2024-10-29 ENCOUNTER — Inpatient Hospital Stay: Attending: Hematology

## 2024-10-29 ENCOUNTER — Inpatient Hospital Stay: Admitting: Hematology

## 2024-10-29 VITALS — BP 119/78 | HR 55 | Temp 97.3°F | Resp 20 | Wt 202.9 lb

## 2024-10-29 DIAGNOSIS — D45 Polycythemia vera: Secondary | ICD-10-CM

## 2024-10-29 LAB — CBC WITH DIFFERENTIAL (CANCER CENTER ONLY)
Abs Immature Granulocytes: 0.11 K/uL — ABNORMAL HIGH (ref 0.00–0.07)
Basophils Absolute: 0.2 K/uL — ABNORMAL HIGH (ref 0.0–0.1)
Basophils Relative: 1 %
Eosinophils Absolute: 0.2 K/uL (ref 0.0–0.5)
Eosinophils Relative: 2 %
HCT: 37.3 % — ABNORMAL LOW (ref 39.0–52.0)
Hemoglobin: 12.4 g/dL — ABNORMAL LOW (ref 13.0–17.0)
Immature Granulocytes: 1 %
Lymphocytes Relative: 14 %
Lymphs Abs: 1.8 K/uL (ref 0.7–4.0)
MCH: 36.7 pg — ABNORMAL HIGH (ref 26.0–34.0)
MCHC: 33.2 g/dL (ref 30.0–36.0)
MCV: 110.4 fL — ABNORMAL HIGH (ref 80.0–100.0)
Monocytes Absolute: 0.9 K/uL (ref 0.1–1.0)
Monocytes Relative: 7 %
Neutro Abs: 9 K/uL — ABNORMAL HIGH (ref 1.7–7.7)
Neutrophils Relative %: 75 %
Platelet Count: 402 K/uL — ABNORMAL HIGH (ref 150–400)
RBC: 3.38 MIL/uL — ABNORMAL LOW (ref 4.22–5.81)
RDW: 14.6 % (ref 11.5–15.5)
WBC Count: 12.2 K/uL — ABNORMAL HIGH (ref 4.0–10.5)
nRBC: 0 % (ref 0.0–0.2)

## 2024-10-29 LAB — CMP (CANCER CENTER ONLY)
ALT: 10 U/L (ref 0–44)
AST: 23 U/L (ref 15–41)
Albumin: 4.6 g/dL (ref 3.5–5.0)
Alkaline Phosphatase: 75 U/L (ref 38–126)
Anion gap: 11 (ref 5–15)
BUN: 26 mg/dL — ABNORMAL HIGH (ref 8–23)
CO2: 25 mmol/L (ref 22–32)
Calcium: 9.2 mg/dL (ref 8.9–10.3)
Chloride: 101 mmol/L (ref 98–111)
Creatinine: 1.46 mg/dL — ABNORMAL HIGH (ref 0.61–1.24)
GFR, Estimated: 48 mL/min — ABNORMAL LOW
Glucose, Bld: 89 mg/dL (ref 70–99)
Potassium: 4.8 mmol/L (ref 3.5–5.1)
Sodium: 137 mmol/L (ref 135–145)
Total Bilirubin: 0.6 mg/dL (ref 0.0–1.2)
Total Protein: 7.2 g/dL (ref 6.5–8.1)

## 2024-10-29 LAB — IRON AND IRON BINDING CAPACITY (CC-WL,HP ONLY)
Iron: 87 ug/dL (ref 45–182)
Saturation Ratios: 23 % (ref 17.9–39.5)
TIBC: 384 ug/dL (ref 250–450)
UIBC: 297 ug/dL

## 2024-10-29 LAB — FERRITIN: Ferritin: 52 ng/mL (ref 24–336)

## 2024-10-29 LAB — LACTATE DEHYDROGENASE: LDH: 279 U/L — ABNORMAL HIGH (ref 105–235)

## 2024-10-29 LAB — TSH: TSH: 1.54 u[IU]/mL (ref 0.350–4.500)

## 2024-10-29 LAB — T4, FREE: Free T4: 1.67 ng/dL (ref 0.80–2.00)

## 2024-10-29 LAB — VITAMIN B12: Vitamin B-12: 3276 pg/mL — ABNORMAL HIGH (ref 180–914)

## 2024-10-29 NOTE — Progress Notes (Signed)
 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 is a wonderful 84 y.o. 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 Patient has been transferred to us  from Dr. Amadeo. 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 He was also diagnosed with Prostate cancer in 2011 and was found to have a Gleason score 3+4 = 7 after prostatectomy on 06/20/2010. The final pathology showed a stage T2c disease.    Patient's current treatment includes hydroxyurea  1000 mg 3 days a week and 500mg  po daily the remaining 4 days of the week.    Patient was last seen by Dr. Amadeo on 10/05/2022 and he was doing well overall. He noted that he was diagnosed with gout and has been getting treated with Prednisone and Allopurinol.    Patient reports he has been doing well overall without any new medical concerns since his last visit with Dr. Amadeo. He reports his last phlebotomy was around 6 months ago and has been receiving phlebotomies as needed.    Patient has been taking Hydroxyurea  as prescribed without any toxicities.    He denies any medication changes, exercise changes, or diet changes.    Patient notes he had an heart attack in 2013 and pulmonary embolism in the past. He is currently taking Coumadin  5 mg due to pulmonary embolism. He has been tolerating his Coumadin  well without any toxicities.    He has been staying hydrated, drinking around 2 L of water everyday. He denies smoking cigarettes and consuming alcohol. He drink half a glass of orange juice everyday.    Patient notes that his prostate cancer is stable with PSA in the normal  ranges and he regularly follows-up with his Urologist.    He regularly follows-up with his Dermatologist for basal cell carcinoma. He denies any history of squamous cell carcinoma.    He denies fever, chills, night sweats, infection issues, leg or hand tingling sensation, unexpected weight loss, abdominal pain, back pain, chest pain, or leg swelling.    SUMMARY OF HEMATOLOGIC HISTORY:  Prior Therapies:   Phlebotomy to keep his hematocrit less than 45.   Hydroxyurea  started in June 2019.     Current Therapy: Hydroxyurea  1,000 mg daily.  INTERVAL HISTORY: Gregory Blankenship. is a 84 y.o. 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 he was last seen by me on 09/17/2024; at the time he mentioned experiencing poor circulation/venous insufficiency in his leg and open sores on his right leg.   Today, he says that his leg pains have improved significantly. He  is compliant with and tolerating his Hydroxyurea  1000 mg daily without toxicities or side effects.   REVIEW OF SYSTEMS:   10 Point review of systems of done and is negative except as noted above.  MEDICAL HISTORY Past Medical History:  Diagnosis Date   Arthritis    back   Blood dyscrasia    Cancer [facility])    prostate surgery- robotic surgery    Coronary artery disease    Hyperlipidemia    Hypertension    patient denies   Hypothyroid    Myocardial infarction [facility]) 2014   Peripheral  vascular disease    phlebitis   Polycythemia    Pulmonary embolism (HCC)    Varicose veins     IMMUNIZATION HISTORY  There is no immunization history on file for this patient.  SURGICAL HISTORY Past Surgical History:  Procedure Laterality Date   CARDIAC CATHETERIZATION  2014   not able to do stent    COLONOSCOPY WITH PROPOFOL  N/A 05/16/2016   Procedure: COLONOSCOPY WITH PROPOFOL ;  Surgeon: Gladis MARLA Louder, MD;  Location: [facility];  Service: Endoscopy;  Laterality: N/A;   INGUINAL HERNIA REPAIR  Right 08/21/2017   Procedure: LAPAROSCOPIC RIGHT  INGUINAL HERNIA  REPAIR WITH MESH;  Surgeon: Rubin Calamity, MD;  Location[facility];  Service: General;  Laterality: Right;   INSERTION OF MESH Right 08/21/2017   Procedure: INSERTION OF MESH;  Surgeon: Rubin Calamity, MD;  Location: [facility] OR;  Service: General;  Laterality: Right;   LEFT HEART CATHETERIZATION WITH CORONARY ANGIOGRAM N/A 12/02/2011   Procedure: LEFT HEART CATHETERIZATION WITH CORONARY ANGIOGRAM;  Surgeon: Dorn JINNY Lesches, MD;  Location: [facility] CATH LAB;  Service: Cardiovascular;  Laterality: N/A;   PERCUTANEOUS CORONARY INTERVENTION-BALLOON ONLY  12/02/2011   Procedure: PERCUTANEOUS CORONARY INTERVENTION-BALLOON ONLY;  Surgeon: Dorn JINNY Lesches, MD;  Location: [facility] CATH LAB;  Service: Cardiovascular;;  Chronic total occlusion   PROSTATE SURGERY  2011   VASCULAR SURGERY     vericose veins in legs     SOCIAL HISTORY Social History[1]  Social History   Social History Narrative   Lives with wife, Particia   Caffeine use: very little   Right handed     [employer] SDOH Screenings   Tobacco Use: Medium Risk (08/15/2024)     FAMILY HISTORY Family History  Problem Relation Age of Onset   Varicose Veins Mother    Cancer Mother    Cancer Father    Coronary artery disease Father        cabg x5   Stroke Maternal Grandfather    Cancer Brother      ALLERGIES: is allergic to erythromycin.  MEDICATIONS  Current Outpatient Medications  Medication Sig Dispense Refill   acetaminophen  (TYLENOL ) 325 MG tablet Take 325 mg by mouth every 6 (six) hours as needed for mild pain.      allopurinol (ZYLOPRIM) 100 MG tablet Take 100 mg by mouth daily.     aspirin  81 MG tablet Take 81 mg by mouth every evening.      B Complex-C (B-COMPLEX WITH VITAMIN C) tablet Take 1 tablet by mouth daily.     carvedilol  (COREG ) 6.25 MG tablet TAKE 1 TABLET(6.25 MG) BY MOUTH TWICE DAILY 180 tablet 3   furosemide (LASIX) 20 MG tablet Take 20  mg by mouth daily as needed.     hydroxyurea  (HYDREA ) 500 MG capsule Take 2 capsules (1,000 mg total) by mouth daily. Take 2 capsules  (1000mg  total) by mouth daily . May take with food to minimize GI side effects. 180 capsule 1   isosorbide  mononitrate (IMDUR ) 30 MG 24 hr tablet TAKE 1 TABLET(30 MG) BY MOUTH DAILY 90 tablet 3   levothyroxine  (SYNTHROID , LEVOTHROID) 100 MCG tablet   5   nitroGLYCERIN  (NITROSTAT ) 0.4 MG SL tablet Place 1 tablet (0.4 mg total) under the tongue every 5 (five) minutes as needed for chest pain. 75 tablet 1   pantoprazole (PROTONIX) 40 MG tablet Take 40 mg by mouth daily.     simvastatin (ZOCOR) 20 MG tablet Take 20 mg every evening by mouth.  warfarin (COUMADIN ) 5 MG tablet Take 5 mg See admin instructions by mouth. In the evening on Mon/Wed/Fri     No current facility-administered medications for this visit.    PHYSICAL EXAMINATION: ECOG PERFORMANCE STATUS: 0 - Asymptomatic VITALS: Vitals:   10/29/24 1405  BP: 119/78  Pulse: (!) 55  Resp: 20  Temp: (!) 97.3 F (36.3 C)  SpO2: 98%   Filed Weights   10/29/24 1405  Weight: 202 lb 14.4 oz (92 kg)   Body mass index is 27.52 kg/m.  GENERAL: alert, in no acute distress and comfortable SKIN: no acute rashes, no significant lesions EYES: conjunctiva are pink and non-injected, sclera anicteric OROPHARYNX: MMM, no exudates, no oropharyngeal erythema or ulceration NECK: supple, no JVD LYMPH:  no palpable lymphadenopathy in the cervical, axillary or inguinal regions LUNGS: clear to auscultation b/l with normal respiratory effort HEART: regular rate & rhythm ABDOMEN:  normoactive bowel sounds , non tender, not distended, no hepatosplenomegaly Extremity: no pedal edema PSYCH: alert & oriented x 3 with fluent speech NEURO: no focal motor/sensory deficits  LABORATORY DATA:   I have reviewed the data as listed     Latest Ref Rng & Units 10/29/2024    1:35 PM 09/17/2024    9:23 AM 07/15/2024    9:48 AM   CBC EXTENDED  WBC 4.0 - 10.5 K/uL 12.2  12.7  11.6   RBC 4.22 - 5.81 MIL/uL 3.38  3.36  3.58   Hemoglobin 13.0 - 17.0 g/dL 87.5  87.5  86.6   HCT 39.0 - 52.0 % 37.3  36.8  38.8   Platelets 150 - 400 K/uL 402  525  590   NEUT# 1.7 - 7.7 K/uL 9.0  9.9  8.4   Lymph# 0.7 - 4.0 K/uL 1.8  1.5  1.7    LDH Lab Results  Component Value Date   LDH 279 (H) 10/29/2024   LDH 750 (H) 09/17/2024   IRON STUDIES Lab Results  Component Value Date   IRON 87 10/29/2024   IRON 93 09/17/2024    Lab Results  Component Value Date   UIBC 297 10/29/2024   UIBC 261 09/17/2024    Lab Results  Component Value Date   TIBC 384 10/29/2024   TIBC 354 09/17/2024    Lab Results  Component Value Date   IRONPCTSAT 23 10/29/2024   IRONPCTSAT 26 09/17/2024    Lab Results  Component Value Date   FERRITIN 52 10/29/2024   FERRITIN 77 09/17/2024      Vitamin B12 Lab Results  Component Value Date   VITAMINB12 3,276 (H) 10/29/2024   VITAMINB12 3,160 (H) 09/17/2024    TSH & Free T4 Lab Results  Component Value Date   TSH 1.540 10/29/2024   TSH 5.040 (H) 09/17/2024   FREET4 1.67 10/29/2024   FREET4 0.96 09/17/2024      Latest Ref Rng & Units 10/29/2024    1:35 PM 09/17/2024    9:23 AM 07/15/2024    9:48 AM  CMP  Glucose 70 - 99 mg/dL 89  889  91   BUN 8 - 23 mg/dL 26  21  26    Creatinine 0.61 - 1.24 mg/dL 8.53  8.58  8.64   Sodium 135 - 145 mmol/L 137  136  136   Potassium 3.5 - 5.1 mmol/L 4.8  4.3  4.8   Chloride 98 - 111 mmol/L 101  101  102   CO2 22 - 32 mmol/L 25  25  30  Calcium  8.9 - 10.3 mg/dL 9.2  9.0  9.9   Total Protein 6.5 - 8.1 g/dL 7.2  7.1  7.5   Total Bilirubin 0.0 - 1.2 mg/dL 0.6  0.5  0.6   Alkaline Phos 38 - 126 U/L 75  79  74   AST 15 - 41 U/L 23  26  23    ALT 0 - 44 U/L 10  11  20      PREVIOUS STUDIES:  RADIOGRAPHIC STUDIES: I have personally reviewed the radiological images as listed and agreed with the findings in the report. No results found.  ASSESSMENT &  PLAN:  84 y.o. male with  1.  JAK2 positive polycythemia vera diagnosed in 2013.    - He is compliant with and tolerating his Hydroxyurea  without toxicities or side effects. Complication associated with this treatment did include myelosuppression, GI toxicity, pruritus among others.   - His counts remain under reasonable control without any need for phlebotomy.   Laboratory data from today did show his counts remain elevated including white cells and platelets however these are improving.  Lab Results  Component Value Date   WBC 12.2 (H) 10/29/2024   WBC 12.7 (H) 09/17/2024   PLT 402 (H) 10/29/2024   PLT 525 (H) 09/17/2024    2. Thrombosis prophylaxis: No bleeding or thrombosis noted at this time.  He continues to be on full dose anticoagulation for cardiac reasons.   3. 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  The recent rise is likely related to his gout flare and prednisone.  Overall his counts under control with the hydroxyurea  dosing.   PLAN: - Discussed lab results on 10/29/2024 in detail with patient: CBC showed WBC of 12.2K decreased from 12.7K, HCT of 37.3% increased from 36.8%, Hemoglobin of 12.4 has not changed and PLTs of 402K decreased from 525K CMP with Creatinine 1.46 increased from 1.41 and BUN 26 increased from 21.  Iron Panel and Ferritin: Iron% 23 and Ferritin 52 LDH 279 Vitamin B12: 3,276. TSH 1.540 and Free T4 1.67.  - He is compliant with and tolerating his Hydroxyurea  1000 mg daily without toxicities or side effects.   FOLLOW-UP in {WEEKS/MONTHS:30939} for labs and follow-up with Dr. Onesimo.  The total time spent in the appointment was *** minutes* .  All of the patient's questions were answered and the patient knows to call the clinic with any problems, questions, or concerns.  Emaline Onesimo MD MS AAHIVMS [facility] Hematology/Oncology Physician [facility]  *Total  Encounter Time as defined by the Centers for Medicare and Medicaid Services includes, in addition to the face-to-face time of a patient visit (documented in the note above) non-face-to-face time: obtaining and reviewing outside history, ordering and reviewing medications, tests or procedures, care coordination (communications with other health care professionals or caregivers) and documentation in the medical record.  I,Emily Lagle,acting as a neurosurgeon for Emaline Onesimo, MD.,have documented all relevant documentation on the behalf of Emaline Onesimo, MD,as directed by  Emaline Onesimo, MD while in the presence of Emaline Onesimo, MD.  I have reviewed the above documentation for accuracy and completeness, and I agree with the above.  Emaline Onesimo, MD     [1]  Social History Tobacco Use   Smoking status: Former    Current packs/day: 0.00    Types: Cigarettes    Quit date: 12/01/1968    Years since quitting: 55.9   Smokeless tobacco: Never  Vaping Use  Vaping status: Never Used  Substance Use Topics   Alcohol use: No    Comment: hx of 5-6 years ago an occasional beer or wine   Drug use: No   "

## 2024-11-08 ENCOUNTER — Encounter: Payer: Self-pay | Admitting: Hematology

## 2025-02-10 ENCOUNTER — Inpatient Hospital Stay: Admitting: Hematology

## 2025-02-10 ENCOUNTER — Inpatient Hospital Stay: Attending: Hematology

## 2025-03-16 ENCOUNTER — Ambulatory Visit (HOSPITAL_COMMUNITY)
